# Patient Record
Sex: Female | Born: 1980 | Race: White | Hispanic: No | Marital: Married | Smoking: Never smoker
Health system: Southern US, Community
[De-identification: ages and names within clinical notes are randomized; demographics above are authoritative.]

## PROBLEM LIST (undated history)

## (undated) DIAGNOSIS — F329 Major depressive disorder, single episode, unspecified: Secondary | ICD-10-CM

## (undated) DIAGNOSIS — F419 Anxiety disorder, unspecified: Secondary | ICD-10-CM

## (undated) DIAGNOSIS — T7840XA Allergy, unspecified, initial encounter: Secondary | ICD-10-CM

## (undated) DIAGNOSIS — F32A Depression, unspecified: Secondary | ICD-10-CM

## (undated) DIAGNOSIS — B001 Herpesviral vesicular dermatitis: Secondary | ICD-10-CM

## (undated) DIAGNOSIS — M549 Dorsalgia, unspecified: Secondary | ICD-10-CM

## (undated) DIAGNOSIS — G43909 Migraine, unspecified, not intractable, without status migrainosus: Secondary | ICD-10-CM

## (undated) HISTORY — DX: Herpesviral vesicular dermatitis: B00.1

## (undated) HISTORY — PX: GANGLION CYST EXCISION: SHX1691

## (undated) HISTORY — DX: Allergy, unspecified, initial encounter: T78.40XA

## (undated) HISTORY — DX: Major depressive disorder, single episode, unspecified: F32.9

## (undated) HISTORY — DX: Migraine, unspecified, not intractable, without status migrainosus: G43.909

## (undated) HISTORY — DX: Anxiety disorder, unspecified: F41.9

## (undated) HISTORY — DX: Depression, unspecified: F32.A

---

## 2008-12-16 ENCOUNTER — Emergency Department (HOSPITAL_BASED_OUTPATIENT_CLINIC_OR_DEPARTMENT_OTHER): Admission: EM | Admit: 2008-12-16 | Discharge: 2008-12-16 | Payer: Self-pay | Admitting: Emergency Medicine

## 2009-01-10 ENCOUNTER — Emergency Department (HOSPITAL_BASED_OUTPATIENT_CLINIC_OR_DEPARTMENT_OTHER): Admission: EM | Admit: 2009-01-10 | Discharge: 2009-01-10 | Payer: Self-pay | Admitting: Emergency Medicine

## 2009-01-10 ENCOUNTER — Ambulatory Visit: Payer: Self-pay | Admitting: Diagnostic Radiology

## 2009-01-12 ENCOUNTER — Ambulatory Visit (HOSPITAL_COMMUNITY): Admission: RE | Admit: 2009-01-12 | Discharge: 2009-01-12 | Payer: Self-pay | Admitting: Pediatrics

## 2011-01-17 LAB — COMPREHENSIVE METABOLIC PANEL
AST: 19 U/L (ref 0–37)
Albumin: 4.3 g/dL (ref 3.5–5.2)
Calcium: 9.4 mg/dL (ref 8.4–10.5)
Chloride: 106 mEq/L (ref 96–112)
Creatinine, Ser: 0.7 mg/dL (ref 0.4–1.2)
GFR calc Af Amer: >60 mL/min (ref >60)
Sodium: 141 mEq/L (ref 135–145)
Total Bilirubin: 0.6 mg/dL (ref 0.3–1.2)

## 2011-01-17 LAB — DIFFERENTIAL
Eosinophils Relative: 1 % (ref 0–5)
Lymphocytes Relative: 28 % (ref 12–46)
Lymphs Abs: 1.7 10*3/uL (ref 0.7–4.0)
Monocytes Absolute: 0.4 10*3/uL (ref 0.1–1.0)

## 2011-01-17 LAB — URINALYSIS, ROUTINE W REFLEX MICROSCOPIC
Bilirubin Urine: NEGATIVE
Hgb urine dipstick: NEGATIVE
Specific Gravity, Urine: 1.018 (ref 1.005–1.030)
pH: 6 (ref 5.0–8.0)

## 2011-01-17 LAB — CBC
MCV: 91.5 fL (ref 78.0–100.0)
Platelets: 233 10*3/uL (ref 150–400)
WBC: 6.2 10*3/uL (ref 4.0–10.5)

## 2011-01-17 LAB — URINE MICROSCOPIC-ADD ON

## 2011-01-17 LAB — POCT TOXICOLOGY PANEL

## 2011-02-20 NOTE — Procedures (Signed)
REFERRING PHYSICIAN:  Deanna Artis. Sharene Skeans, M.D.   CLINICAL HISTORY:  A 30 year old, who is being evaluated for recent  episode of confusion, staring, forgetfulness, and possible seizures.   MEDICATION:  Listed antibiotic for bladder infection.   This is an 17-channel routine EEG recorded with the patient awake and  drowsy, using a 17-channel machine and standard 10/20 electrode  placement.   Background awake rhythm consists of 8-9 Hz alpha, which is of moderate  amplitude, synchronous, reactive to eye-opening and closure.  No  paroxysmal epileptiform activity is noted.  However, intermittent left  temporal and central sharp waves are noted as well as theta slowing.  Rare isolated right central sharp waves are noted as well particularly  during drowsiness.  Length of the recording is 21.8 minutes.  Technical  component is average.  EKG tracing reveals regular sinus rhythm.  Hyperventilation and photic stimulation unremarkable.  Stages of light  sleep are noted towards the end of the tracing.   IMPRESSION:  This EEG is abnormal due to presence of focal, left  temporal, and central cortical irritability, but no definite  epileptiform features are seen.           ______________________________  Sunny Schlein. Pearlean Brownie, MD     ZOX:WRUE  D:  01/12/2009 19:51:19  T:  01/13/2009 04:51:02  Job #:  [id number]

## 2013-02-22 ENCOUNTER — Emergency Department: Payer: Self-pay | Admitting: Emergency Medicine

## 2013-02-22 LAB — URINALYSIS, COMPLETE
Bilirubin,UR: NEGATIVE
Glucose,UR: NEGATIVE mg/dL (ref 0–75)
Ketone: NEGATIVE
Protein: NEGATIVE
RBC,UR: 33 /HPF (ref 0–5)
Specific Gravity: 1.01 (ref 1.003–1.030)
Squamous Epithelial: 2
WBC UR: 58 /HPF (ref 0–5)

## 2013-02-25 LAB — BETA STREP CULTURE(ARMC)

## 2013-02-27 ENCOUNTER — Emergency Department: Payer: Self-pay | Admitting: Emergency Medicine

## 2013-02-27 LAB — CBC WITH DIFFERENTIAL/PLATELET
Basophil #: 0 10*3/uL (ref 0.0–0.1)
Basophil %: 0.5 %
Eosinophil #: 0 10*3/uL (ref 0.0–0.7)
HCT: 37.3 % (ref 35.0–47.0)
HGB: 12.7 g/dL (ref 12.0–16.0)
Lymphocyte #: 2.1 10*3/uL (ref 1.0–3.6)
Lymphocyte %: 37.3 %
MCH: 29.2 pg (ref 26.0–34.0)
MCHC: 34.2 g/dL (ref 32.0–36.0)
Monocyte %: 8.3 %
Neutrophil %: 53 %
RBC: 4.37 10*6/uL (ref 3.80–5.20)

## 2013-02-27 LAB — URINALYSIS, COMPLETE
Bacteria: NONE SEEN
Bilirubin,UR: NEGATIVE
Glucose,UR: NEGATIVE mg/dL (ref 0–75)
Nitrite: NEGATIVE
Ph: 6 (ref 4.5–8.0)
Protein: NEGATIVE

## 2013-02-27 LAB — COMPREHENSIVE METABOLIC PANEL
Anion Gap: 5 — ABNORMAL LOW (ref 7–16)
BUN: 12 mg/dL (ref 7–18)
Bilirubin,Total: 0.3 mg/dL (ref 0.2–1.0)
Chloride: 109 mmol/L — ABNORMAL HIGH (ref 98–107)
EGFR (African American): >60
EGFR (Non-African Amer.): >60
Glucose: 88 mg/dL (ref 65–99)
Osmolality: 279 (ref 275–301)
Potassium: 3.7 mmol/L (ref 3.5–5.1)
Sodium: 140 mmol/L (ref 136–145)

## 2013-02-28 LAB — URINE CULTURE

## 2013-06-14 ENCOUNTER — Emergency Department: Payer: Self-pay | Admitting: Internal Medicine

## 2013-08-06 ENCOUNTER — Emergency Department: Payer: Self-pay | Admitting: Emergency Medicine

## 2013-08-06 LAB — URINALYSIS, COMPLETE
Blood: NEGATIVE
Glucose,UR: NEGATIVE mg/dL (ref 0–75)
Leukocyte Esterase: NEGATIVE
Protein: NEGATIVE
RBC,UR: <1 /HPF (ref 0–5)
Specific Gravity: 1.017 (ref 1.003–1.030)
Squamous Epithelial: 6
WBC UR: <1 /HPF (ref 0–5)

## 2013-08-06 LAB — COMPREHENSIVE METABOLIC PANEL
BUN: 12 mg/dL (ref 7–18)
Chloride: 108 mmol/L — ABNORMAL HIGH (ref 98–107)
Co2: 28 mmol/L (ref 21–32)
Creatinine: 0.78 mg/dL (ref 0.60–1.30)
Glucose: 91 mg/dL (ref 65–99)
SGPT (ALT): 16 U/L (ref 12–78)
Sodium: 139 mmol/L (ref 136–145)

## 2013-08-06 LAB — CBC
HGB: 13.4 g/dL (ref 12.0–16.0)
MCHC: 33.8 g/dL (ref 32.0–36.0)
MCV: 87 fL (ref 80–100)
RBC: 4.56 10*6/uL (ref 3.80–5.20)
WBC: 7.3 10*3/uL (ref 3.6–11.0)

## 2013-08-06 LAB — LIPASE, BLOOD: Lipase: 72 U/L — ABNORMAL LOW (ref 73–393)

## 2013-08-17 ENCOUNTER — Ambulatory Visit: Payer: Self-pay | Admitting: Surgery

## 2013-08-18 ENCOUNTER — Ambulatory Visit: Payer: Self-pay | Admitting: Surgery

## 2013-08-26 ENCOUNTER — Ambulatory Visit: Payer: Self-pay | Admitting: Surgery

## 2014-01-08 ENCOUNTER — Emergency Department: Payer: Self-pay | Admitting: Emergency Medicine

## 2014-08-27 ENCOUNTER — Ambulatory Visit: Payer: Self-pay | Admitting: Specialist

## 2014-10-08 NOTE — L&D Delivery Note (Signed)
Delivery Summary for LiberiaBrittany M Lozano  Labor Events:   Preterm labor:   Rupture date:   Rupture time:   Rupture type: Intact  Fluid Color:   Induction:   Augmentation:   Complications:   Cervical ripening:          Delivery:   Episiotomy:   Lacerations:   Repair suture:   Repair # of packets:   Blood loss (ml): 900   Information for the patient's newborn:  Blane Oharaorterfield, Boy Shalamar [[id number]][[id number]]    Delivery 08/26/2015 12:43 PM by  C-Section, Low Transverse Sex:  female Gestational Age: 325w5d Delivery Clinician:  Hildred LaserAnika Berneta Sconyers Living?: Yes        APGARS  One minute Five minutes Ten minutes  Skin color: 1   1      Heart rate: 2   2      Grimace: 2   2      Muscle tone: 2   2      Breathing: 2   2      Totals: 9  9      Presentation/position:      Resuscitation: None  Cord information: 3 vessels   Disposition of cord blood:     Blood gases sent?  Complications:   Placenta: Delivered: 08/26/2015 12:44 PM    Intact appearance Newborn Measurements: Weight: 10 lb 1.2 oz (4570 g)  Height: 21.26"  Head circumference: 38 cm  Chest circumference: 38 cm  Other providers: Delivery Assist Delivery Nurse Registered Nurse Neonatologist Melody N Shambley Geralyn FlashKendra L Pierce Samantha D Murriel HopperVaughn Christie Davanzo  Additional  information: Forceps:   Vacuum:   Breech:   Observed anomalies       See Operative Note for details of C-section procedure.   Hildred LaserAnika Alesandro Stueve, MD [facility]

## 2015-01-04 LAB — OB RESULTS CONSOLE RUBELLA ANTIBODY, IGM
RUBELLA: IMMUNE
Rubella: IMMUNE
Rubella: IMMUNE

## 2015-01-04 LAB — OB RESULTS CONSOLE ABO/RH
RH TYPE: POSITIVE
RH Type: POSITIVE
RH Type: POSITIVE

## 2015-01-04 LAB — OB RESULTS CONSOLE HIV ANTIBODY (ROUTINE TESTING)
HIV: NONREACTIVE
HIV: NONREACTIVE
HIV: NONREACTIVE

## 2015-01-04 LAB — OB RESULTS CONSOLE HEPATITIS B SURFACE ANTIGEN
HEP B S AG: NEGATIVE
Hepatitis B Surface Ag: NEGATIVE
Hepatitis B Surface Ag: NEGATIVE

## 2015-01-04 LAB — OB RESULTS CONSOLE VARICELLA ZOSTER ANTIBODY, IGG
VARICELLA IGG: IMMUNE
Varicella: IMMUNE
Varicella: IMMUNE

## 2015-01-04 LAB — OB RESULTS CONSOLE GC/CHLAMYDIA
CHLAMYDIA, DNA PROBE: NEGATIVE
Chlamydia: NEGATIVE
Chlamydia: NEGATIVE
GC PROBE AMP, GENITAL: NEGATIVE
GC PROBE AMP, GENITAL: NEGATIVE
GC PROBE AMP, GENITAL: NEGATIVE

## 2015-01-04 LAB — OB RESULTS CONSOLE RPR
RPR: NONREACTIVE
RPR: NONREACTIVE
RPR: NONREACTIVE

## 2015-01-04 LAB — OB RESULTS CONSOLE ANTIBODY SCREEN
ANTIBODY SCREEN: NEGATIVE
ANTIBODY SCREEN: NEGATIVE
Antibody Screen: NEGATIVE

## 2015-01-04 LAB — OB RESULTS CONSOLE PLATELET COUNT
PLATELETS: 278 10*3/uL
Platelets: 278 10*3/uL

## 2015-01-04 LAB — OB RESULTS CONSOLE HGB/HCT, BLOOD
HCT: 40 %
HEMATOCRIT: 40 %
Hemoglobin: 13.5 g/dL
Hemoglobin: 13.5 g/dL

## 2015-01-29 NOTE — Op Note (Signed)
PATIENT NAME:  Rachel [name], Rachel [name] MR#:  [id number] DATE OF BIRTH:  Sep 14, 1981  DATE OF PROCEDURE:  08/27/2014  PREOPERATIVE DIAGNOSIS: Dorsal right wrist ganglion.   POSTOPERATIVE DIAGNOSIS: Dorsal right wrist ganglion.   PROCEDURE: Excision of dorsal right wrist ganglion.   SURGEON: Clare Gandyhristopher E. Jeremaih Klima, MD.   ANESTHESIA: General.   COMPLICATIONS: None.   TOURNIQUET TIME: 55 minutes.   DESCRIPTION OF PROCEDURE: After adequate induction of general anesthesia, the right upper extremity is thoroughly prepped with alcohol and ChloraPrep and draped in a standard sterile fashion. The extremity is wrapped out with the Esmarch bandage and pneumatic tourniquet elevated to 250 mmHg. Under loupe magnification, a standard transverse dorsal incision is made at the wrist over the prominence of the ganglion. The dissection is carried down and the retinaculum is incised transversely the tendons underlying are retracted to each side and preserved throughout the case. The ganglion is completely dissected out under loupe magnification, including a stalk which extends down to the radiocarpal joint. A 1 cm portion of dorsal capsule is also removed with the specimen which is sent to pathology. Careful search for any residual ganglion is made and none is seen. The wound is thoroughly irrigated multiple times. Skin edges are infiltrated with 0.5% plain Marcaine. Retinaculum is repaired with 5-0 Vicryl. One 5-0 Vicryl subcutaneous suture is placed. The skin is closed with a running subcuticular 3-0 Prolene. A soft bulky dressing with a volar fiberglass splint is applied. The tourniquet is released. The patient is returned to the recovery room in satisfactory condition having tolerated the procedure quite well.     ____________________________ Clare Gandyhristopher E. Kasem Mozer, MD ces:at D: 08/27/2014 09:52:35 ET T: 08/27/2014 14:47:17 ET JOB#: [id number]  cc: Clare Gandyhristopher E. Tamya Denardo, MD, <Dictator> Clare GandyHRISTOPHER E Tarea Skillman  MD ELECTRONICALLY SIGNED 08/31/2014 12:30

## 2015-01-31 LAB — SURGICAL PATHOLOGY

## 2015-03-06 DIAGNOSIS — F339 Major depressive disorder, recurrent, unspecified: Secondary | ICD-10-CM | POA: Insufficient documentation

## 2015-03-06 DIAGNOSIS — F419 Anxiety disorder, unspecified: Secondary | ICD-10-CM

## 2015-03-06 DIAGNOSIS — F341 Dysthymic disorder: Secondary | ICD-10-CM | POA: Insufficient documentation

## 2015-03-06 DIAGNOSIS — F329 Major depressive disorder, single episode, unspecified: Secondary | ICD-10-CM

## 2015-03-09 ENCOUNTER — Encounter: Payer: Self-pay | Admitting: Obstetrics and Gynecology

## 2015-03-09 ENCOUNTER — Ambulatory Visit (INDEPENDENT_AMBULATORY_CARE_PROVIDER_SITE_OTHER): Payer: BLUE CROSS/BLUE SHIELD | Admitting: Obstetrics and Gynecology

## 2015-03-09 ENCOUNTER — Encounter (HOSPITAL_COMMUNITY): Payer: Self-pay

## 2015-03-09 VITALS — BP 128/84 | HR 88 | Ht 65.0 in | Wt 160.5 lb

## 2015-03-09 DIAGNOSIS — Z3490 Encounter for supervision of normal pregnancy, unspecified, unspecified trimester: Secondary | ICD-10-CM

## 2015-03-09 DIAGNOSIS — Z331 Pregnant state, incidental: Secondary | ICD-10-CM

## 2015-03-09 NOTE — Progress Notes (Signed)
ROB- doing well except for onset of daily HAs x 2 weeks, mainly located at back of head/neck and sometimes radiate to temples- vomited once with HA; not relieved with tylenol- reviewed normal causes of HAs in pregnancy including increased hormones, caffiene withdrawal, muscle strain- to try motrin 2 tablets with 6oz caffiene every 6 hours as needed; reviewed low risk female noted on panarama; Anatomy scan next visit.

## 2015-03-09 NOTE — Progress Notes (Signed)
Pt is c/o headaches x 2 weeks, not relieved by tylenol

## 2015-04-06 ENCOUNTER — Encounter: Payer: Self-pay | Admitting: Obstetrics and Gynecology

## 2015-04-06 ENCOUNTER — Ambulatory Visit (INDEPENDENT_AMBULATORY_CARE_PROVIDER_SITE_OTHER): Payer: BLUE CROSS/BLUE SHIELD | Admitting: Obstetrics and Gynecology

## 2015-04-06 ENCOUNTER — Ambulatory Visit: Payer: BLUE CROSS/BLUE SHIELD

## 2015-04-06 VITALS — BP 89/58 | HR 106 | Wt 165.0 lb

## 2015-04-06 DIAGNOSIS — Z3492 Encounter for supervision of normal pregnancy, unspecified, second trimester: Secondary | ICD-10-CM | POA: Diagnosis not present

## 2015-04-06 DIAGNOSIS — Z3490 Encounter for supervision of normal pregnancy, unspecified, unspecified trimester: Secondary | ICD-10-CM

## 2015-04-06 LAB — POCT URINALYSIS DIPSTICK
Bilirubin, UA: NEGATIVE
Blood, UA: NEGATIVE
Glucose, UA: 100
Ketones, UA: NEGATIVE
LEUKOCYTES UA: NEGATIVE
Nitrite, UA: NEGATIVE
Spec Grav, UA: 1.01
UROBILINOGEN UA: 0.2
pH, UA: 6.5

## 2015-04-06 NOTE — Progress Notes (Signed)
ROB & Anatomy scan; ScanFetal presentation is breech, spine posterior.  EFW: 290 grams ( 10 oz. ) Placenta: anterior, grade 0, and remote to cervix by > 4 cm. AFI: adequate with mvp at 4.1 cm.  Anatomic survey is complete and normal; Gender - female  .   Right Ovary is not visualized. Left Ovary is not visualized. There is no evidence of a corpus luteal cyst. Survey of the adnexa demonstrates no adnexal masses. There is no free peritoneal fluid in the cul de sac.  Impression: 1. 19 week 3 day Viable Singleton Intrauterine pregnancy by U/S. 2. (U/S) EDD is consistent with Clinically established (LMP) EDD of 08/28/15. 3. Normal Anatomy Scan reveals  Reiterated need to increase water intake daily.

## 2015-04-06 NOTE — Progress Notes (Signed)
Pt denies any complaints.

## 2015-04-21 ENCOUNTER — Other Ambulatory Visit: Payer: Self-pay

## 2015-04-21 MED ORDER — ACYCLOVIR 400 MG PO TABS: 400.0000 mg | ORAL_TABLET | Freq: Three times a day (TID) | ORAL | Status: DC

## 2015-04-25 ENCOUNTER — Other Ambulatory Visit: Payer: Self-pay

## 2015-04-25 MED ORDER — ACYCLOVIR 400 MG PO TABS: 400.0000 mg | ORAL_TABLET | Freq: Three times a day (TID) | ORAL | Status: DC

## 2015-05-04 ENCOUNTER — Encounter: Payer: Self-pay | Admitting: Obstetrics and Gynecology

## 2015-05-04 ENCOUNTER — Ambulatory Visit (INDEPENDENT_AMBULATORY_CARE_PROVIDER_SITE_OTHER): Payer: BLUE CROSS/BLUE SHIELD | Admitting: Obstetrics and Gynecology

## 2015-05-04 VITALS — BP 92/69 | HR 92 | Wt 170.6 lb

## 2015-05-04 DIAGNOSIS — Z3492 Encounter for supervision of normal pregnancy, unspecified, second trimester: Secondary | ICD-10-CM

## 2015-05-04 LAB — POCT URINALYSIS DIPSTICK
Bilirubin, UA: NEGATIVE
Blood, UA: NEGATIVE
Glucose, UA: 250
KETONES UA: NEGATIVE
Leukocytes, UA: NEGATIVE
Nitrite, UA: NEGATIVE
Spec Grav, UA: 1.01
Urobilinogen, UA: 0.2
pH, UA: 7

## 2015-05-04 NOTE — Progress Notes (Signed)
Pt is here denies any complaints 

## 2015-05-04 NOTE — Progress Notes (Signed)
ROB- doing well, recommended BFC and sibling class- schedule given; glucola next visit.

## 2015-05-31 ENCOUNTER — Ambulatory Visit (INDEPENDENT_AMBULATORY_CARE_PROVIDER_SITE_OTHER): Payer: BLUE CROSS/BLUE SHIELD | Admitting: Obstetrics and Gynecology

## 2015-05-31 ENCOUNTER — Encounter: Payer: Self-pay | Admitting: Obstetrics and Gynecology

## 2015-05-31 VITALS — BP 92/68 | HR 90 | Wt 173.9 lb

## 2015-05-31 DIAGNOSIS — Z23 Encounter for immunization: Secondary | ICD-10-CM | POA: Diagnosis not present

## 2015-05-31 DIAGNOSIS — Z331 Pregnant state, incidental: Secondary | ICD-10-CM

## 2015-05-31 DIAGNOSIS — Z131 Encounter for screening for diabetes mellitus: Secondary | ICD-10-CM

## 2015-05-31 LAB — POCT URINALYSIS DIPSTICK
KETONES UA: NEGATIVE
Leukocytes, UA: NEGATIVE
NITRITE UA: NEGATIVE
PH UA: 7
RBC UA: NEGATIVE
SPEC GRAV UA: 1.01
Urobilinogen, UA: 0.2

## 2015-05-31 MED ORDER — TETANUS-DIPHTH-ACELL PERTUSSIS 5-2.5-18.5 LF-MCG/0.5 IM SUSP
0.5000 mL | Freq: Once | INTRAMUSCULAR | Status: AC
  Administered 2015-05-31: 0.5 mL via INTRAMUSCULAR

## 2015-05-31 NOTE — Progress Notes (Signed)
ROB- glucola done, used repHresh and worked well. Discussed cord blood banking. Ok to take tylenol PM as needed.

## 2015-05-31 NOTE — Progress Notes (Signed)
ROB-having some trouble sleeping, tdap given, blood consent signed, glucola done today

## 2015-06-01 ENCOUNTER — Other Ambulatory Visit: Payer: Self-pay | Admitting: Obstetrics and Gynecology

## 2015-06-01 ENCOUNTER — Telehealth: Payer: Self-pay | Admitting: *Deleted

## 2015-06-01 DIAGNOSIS — O99019 Anemia complicating pregnancy, unspecified trimester: Secondary | ICD-10-CM | POA: Insufficient documentation

## 2015-06-01 LAB — HEMOGLOBIN AND HEMATOCRIT, BLOOD
Hematocrit: 32.5 % — ABNORMAL LOW (ref 34.0–46.6)
Hemoglobin: 10.8 g/dL — ABNORMAL LOW (ref 11.1–15.9)

## 2015-06-01 LAB — GLUCOSE, 1 HOUR GESTATIONAL: Gestational Diabetes Screen: 116 mg/dL (ref 65–139)

## 2015-06-01 MED ORDER — FUSION PLUS PO CAPS: 1.0000 | ORAL_CAPSULE | Freq: Every day | ORAL | Status: DC

## 2015-06-01 NOTE — Telephone Encounter (Signed)
-----   Message from Rachel [name], [place] sent at 06/01/2015  8:32 AM EDT ----- Please let her know she passed her glucola and is slightly anemic- want her to start iron supplement- I sent in rx for Fusion plus- to take one daily Monday-Friday every week till delivery

## 2015-06-01 NOTE — Telephone Encounter (Signed)
Notified pt of lab results, she will pick up rx , pt voiced understanding

## 2015-06-06 ENCOUNTER — Telehealth: Payer: Self-pay | Admitting: Obstetrics and Gynecology

## 2015-06-06 NOTE — Telephone Encounter (Signed)
Pt has a question about a virus.... Possibly strep throat and [redacted] wks pregnant.

## 2015-06-07 ENCOUNTER — Other Ambulatory Visit: Payer: Self-pay | Admitting: *Deleted

## 2015-06-07 MED ORDER — CEFDINIR 300 MG PO CAPS: 300.0000 mg | ORAL_CAPSULE | Freq: Two times a day (BID) | ORAL | Status: DC

## 2015-06-07 NOTE — Telephone Encounter (Signed)
Pt is coming by for strep test

## 2015-06-14 ENCOUNTER — Encounter: Payer: Self-pay | Admitting: Obstetrics and Gynecology

## 2015-06-14 ENCOUNTER — Ambulatory Visit (INDEPENDENT_AMBULATORY_CARE_PROVIDER_SITE_OTHER): Payer: BLUE CROSS/BLUE SHIELD | Admitting: Obstetrics and Gynecology

## 2015-06-14 VITALS — BP 95/54 | HR 109 | Wt 177.1 lb

## 2015-06-14 DIAGNOSIS — B370 Candidal stomatitis: Secondary | ICD-10-CM

## 2015-06-14 DIAGNOSIS — Z331 Pregnant state, incidental: Secondary | ICD-10-CM

## 2015-06-14 LAB — POCT URINALYSIS DIPSTICK
Bilirubin, UA: NEGATIVE
Blood, UA: NEGATIVE
Ketones, UA: NEGATIVE
LEUKOCYTES UA: NEGATIVE
Nitrite, UA: NEGATIVE
PROTEIN UA: NEGATIVE
UROBILINOGEN UA: 0.2
pH, UA: 5

## 2015-06-14 MED ORDER — NYSTATIN 100000 UNIT/ML MT SUSP: 5.0000 mL | Freq: Four times a day (QID) | OROMUCOSAL | Status: DC

## 2015-06-14 NOTE — Progress Notes (Signed)
ROB-pt was treated for strep throat last week, she is having some coughing-dry cough

## 2015-06-14 NOTE — Progress Notes (Signed)
ROB- completed treatment for strep, thrush noted on tongue Rx for Nystatin sent to [facility]. Otherwise doing well.

## 2015-06-28 ENCOUNTER — Encounter: Payer: Self-pay | Admitting: Obstetrics and Gynecology

## 2015-06-28 ENCOUNTER — Ambulatory Visit (INDEPENDENT_AMBULATORY_CARE_PROVIDER_SITE_OTHER): Payer: BLUE CROSS/BLUE SHIELD | Admitting: Obstetrics and Gynecology

## 2015-06-28 VITALS — BP 94/65 | HR 96 | Wt 178.7 lb

## 2015-06-28 DIAGNOSIS — Z3493 Encounter for supervision of normal pregnancy, unspecified, third trimester: Secondary | ICD-10-CM

## 2015-06-28 LAB — POCT URINALYSIS DIPSTICK
BILIRUBIN UA: NEGATIVE
Ketones, UA: NEGATIVE
Leukocytes, UA: NEGATIVE
Nitrite, UA: NEGATIVE
Protein, UA: NEGATIVE
RBC UA: NEGATIVE
UROBILINOGEN UA: 0.2
pH, UA: 6

## 2015-06-28 MED ORDER — INFLUENZA VAC SPLIT QUAD 0.5 ML IM SUSY: 0.5000 mL | PREFILLED_SYRINGE | Freq: Once | INTRAMUSCULAR | Status: DC

## 2015-06-28 NOTE — Progress Notes (Signed)
ROB-pt denies any new complaints, flu vaccine given

## 2015-06-28 NOTE — Progress Notes (Signed)
ROB- discussed diet and increasing protein; also will start tapering off Zoloft per her preference.

## 2015-07-13 ENCOUNTER — Ambulatory Visit (INDEPENDENT_AMBULATORY_CARE_PROVIDER_SITE_OTHER): Payer: BLUE CROSS/BLUE SHIELD | Admitting: Obstetrics and Gynecology

## 2015-07-13 ENCOUNTER — Encounter: Payer: Self-pay | Admitting: Obstetrics and Gynecology

## 2015-07-13 VITALS — BP 98/68 | HR 100 | Wt 179.6 lb

## 2015-07-13 DIAGNOSIS — Z3493 Encounter for supervision of normal pregnancy, unspecified, third trimester: Secondary | ICD-10-CM

## 2015-07-13 LAB — POCT URINALYSIS DIPSTICK
BILIRUBIN UA: NEGATIVE
Ketones, UA: NEGATIVE
Leukocytes, UA: NEGATIVE
Nitrite, UA: NEGATIVE
Protein, UA: NEGATIVE
RBC UA: NEGATIVE
SPEC GRAV UA: 1.015
Urobilinogen, UA: 0.2
pH, UA: 6

## 2015-07-13 NOTE — Progress Notes (Signed)
ROB-c/o R hip pain x 3 days seems to have gotten worse

## 2015-07-13 NOTE — Patient Instructions (Signed)
Sciatica Sciatica is pain, weakness, numbness, or tingling along the path of the sciatic nerve. The nerve starts in the lower back and runs down the back of each leg. The nerve controls the muscles in the lower leg and in the back of the knee, while also providing sensation to the back of the thigh, lower leg, and the sole of your foot. Sciatica is a symptom of another medical condition. For instance, nerve damage or certain conditions, such as a herniated disk or bone spur on the spine, pinch or put pressure on the sciatic nerve. This causes the pain, weakness, or other sensations normally associated with sciatica. Generally, sciatica only affects one side of the body. CAUSES   Herniated or slipped disc.  Degenerative disk disease.  A pain disorder involving the narrow muscle in the buttocks (piriformis syndrome).  Pelvic injury or fracture.  Pregnancy.  Tumor (rare). SYMPTOMS  Symptoms can vary from mild to very severe. The symptoms usually travel from the low back to the buttocks and down the back of the leg. Symptoms can include:  Mild tingling or dull aches in the lower back, leg, or hip.  Numbness in the back of the calf or sole of the foot.  Burning sensations in the lower back, leg, or hip.  Sharp pains in the lower back, leg, or hip.  Leg weakness.  Severe back pain inhibiting movement. These symptoms may get worse with coughing, sneezing, laughing, or prolonged sitting or standing. Also, being overweight may worsen symptoms. DIAGNOSIS  Your caregiver will perform a physical exam to look for common symptoms of sciatica. He or she may ask you to do certain movements or activities that would trigger sciatic nerve pain. Other tests may be performed to find the cause of the sciatica. These may include:  Blood tests.  X-rays.  Imaging tests, such as an MRI or CT scan. TREATMENT  Treatment is directed at the cause of the sciatic pain. Sometimes, treatment is not necessary  and the pain and discomfort goes away on its own. If treatment is needed, your caregiver may suggest:  Over-the-counter medicines to relieve pain.  Prescription medicines, such as anti-inflammatory medicine, muscle relaxants, or narcotics.  Applying heat or ice to the painful area.  Steroid injections to lessen pain, irritation, and inflammation around the nerve.  Reducing activity during periods of pain.  Exercising and stretching to strengthen your abdomen and improve flexibility of your spine. Your caregiver may suggest losing weight if the extra weight makes the back pain worse.  Physical therapy.  Surgery to eliminate what is pressing or pinching the nerve, such as a bone spur or part of a herniated disk. HOME CARE INSTRUCTIONS   Only take over-the-counter or prescription medicines for pain or discomfort as directed by your caregiver.  Apply ice to the affected area for 20 minutes, 3-4 times a day for the first 48-72 hours. Then try heat in the same way.  Exercise, stretch, or perform your usual activities if these do not aggravate your pain.  Attend physical therapy sessions as directed by your caregiver.  Keep all follow-up appointments as directed by your caregiver.  Do not wear high heels or shoes that do not provide proper support.  Check your mattress to see if it is too soft. A firm mattress may lessen your pain and discomfort. SEEK IMMEDIATE MEDICAL CARE IF:   You lose control of your bowel or bladder (incontinence).  You have increasing weakness in the lower back, pelvis, buttocks,   or legs.  You have redness or swelling of your back.  You have a burning sensation when you urinate.  You have pain that gets worse when you lie down or awakens you at night.  Your pain is worse than you have experienced in the past.  Your pain is lasting longer than 4 weeks.  You are suddenly losing weight without reason. MAKE SURE YOU:  Understand these  instructions.  Will watch your condition.  Will get help right away if you are not doing well or get worse.   This information is not intended to replace advice given to you by your health care provider. Make sure you discuss any questions you have with your health care provider.   Document Released: 09/18/2001 Document Revised: 06/15/2015 Document Reviewed: 02/03/2012 Elsevier Interactive Patient Education 2016 [employer]. Levonorgestrel intrauterine device (IUD) What is this medicine? LEVONORGESTREL IUD (LEE voe nor jes trel) is a contraceptive (birth control) device. The device is placed inside the uterus by a healthcare professional. It is used to prevent pregnancy and can also be used to treat heavy bleeding that occurs during your period. Depending on the device, it can be used for 3 to 5 years. This medicine may be used for other purposes; ask your health care provider or pharmacist if you have questions. What should I tell my health care provider before I take this medicine? They need to know if you have any of these conditions: -abnormal Pap smear -cancer of the breast, uterus, or cervix -diabetes -endometritis -genital or pelvic infection now or in the past -have more than one sexual partner or your partner has more than one partner -heart disease -history of an ectopic or tubal pregnancy -immune system problems -IUD in place -liver disease or tumor -problems with blood clots or take blood-thinners -use intravenous drugs -uterus of unusual shape -vaginal bleeding that has not been explained -an unusual or allergic reaction to levonorgestrel, other hormones, silicone, or polyethylene, medicines, foods, dyes, or preservatives -pregnant or trying to get pregnant -breast-feeding How should I use this medicine? This device is placed inside the uterus by a health care professional. Talk to your pediatrician regarding the use of this medicine in children. Special care may be  needed. Overdosage: If you think you have taken too much of this medicine contact a poison control center or emergency room at once. NOTE: This medicine is only for you. Do not share this medicine with others. What if I miss a dose? This does not apply. What may interact with this medicine? Do not take this medicine with any of the following medications: -amprenavir -bosentan -fosamprenavir This medicine may also interact with the following medications: -aprepitant -barbiturate medicines for inducing sleep or treating seizures -bexarotene -griseofulvin -medicines to treat seizures like carbamazepine, ethotoin, felbamate, oxcarbazepine, phenytoin, topiramate -modafinil -pioglitazone -rifabutin -rifampin -rifapentine -some medicines to treat HIV infection like atazanavir, indinavir, lopinavir, nelfinavir, tipranavir, ritonavir -St. John's wort -warfarin This list may not describe all possible interactions. Give your health care provider a list of all the medicines, herbs, non-prescription drugs, or dietary supplements you use. Also tell them if you smoke, drink alcohol, or use illegal drugs. Some items may interact with your medicine. What should I watch for while using this medicine? Visit your doctor or health care professional for regular check ups. See your doctor if you or your partner has sexual contact with others, becomes HIV positive, or gets a sexual transmitted disease. This product does not protect you against HIV  infection (AIDS) or other sexually transmitted diseases. You can check the placement of the IUD yourself by reaching up to the top of your vagina with clean fingers to feel the threads. Do not pull on the threads. It is a good habit to check placement after each menstrual period. Call your doctor right away if you feel more of the IUD than just the threads or if you cannot feel the threads at all. The IUD may come out by itself. You may become pregnant if the device  comes out. If you notice that the IUD has come out use a backup birth control method like condoms and call your health care provider. Using tampons will not change the position of the IUD and are okay to use during your period. What side effects may I notice from receiving this medicine? Side effects that you should report to your doctor or health care professional as soon as possible: -allergic reactions like skin rash, itching or hives, swelling of the face, lips, or tongue -fever, flu-like symptoms -genital sores -high blood pressure -no menstrual period for 6 weeks during use -pain, swelling, warmth in the leg -pelvic pain or tenderness -severe or sudden headache -signs of pregnancy -stomach cramping -sudden shortness of breath -trouble with balance, talking, or walking -unusual vaginal bleeding, discharge -yellowing of the eyes or skin Side effects that usually do not require medical attention (report to your doctor or health care professional if they continue or are bothersome): -acne -breast pain -change in sex drive or performance -changes in weight -cramping, dizziness, or faintness while the device is being inserted -headache -irregular menstrual bleeding within first 3 to 6 months of use -nausea This list may not describe all possible side effects. Call your doctor for medical advice about side effects. You may report side effects to FDA at 1-800-FDA-1088. Where should I keep my medicine? This does not apply. NOTE: This sheet is a summary. It may not cover all possible information. If you have questions about this medicine, talk to your doctor, pharmacist, or health care provider.    2016, Elsevier/Gold Standard. (2011-10-25 13:54:04)

## 2015-07-13 NOTE — Progress Notes (Signed)
ROB- recommend chiropractor for sciatic pain

## 2015-07-26 ENCOUNTER — Encounter: Payer: Self-pay | Admitting: Obstetrics and Gynecology

## 2015-07-26 ENCOUNTER — Ambulatory Visit (INDEPENDENT_AMBULATORY_CARE_PROVIDER_SITE_OTHER): Payer: BLUE CROSS/BLUE SHIELD | Admitting: Obstetrics and Gynecology

## 2015-07-26 VITALS — BP 98/67 | HR 74 | Wt 181.3 lb

## 2015-07-26 DIAGNOSIS — Z3493 Encounter for supervision of normal pregnancy, unspecified, third trimester: Secondary | ICD-10-CM

## 2015-07-26 LAB — POCT URINALYSIS DIPSTICK
Bilirubin, UA: NEGATIVE
Blood, UA: NEGATIVE
KETONES UA: NEGATIVE
LEUKOCYTES UA: NEGATIVE
Nitrite, UA: NEGATIVE
PH UA: 7
SPEC GRAV UA: 1.01
UROBILINOGEN UA: 0.2

## 2015-07-26 NOTE — Progress Notes (Signed)
ROB doing well, cultures next visit. 

## 2015-07-26 NOTE — Progress Notes (Signed)
ROB-pt denies any changes, states her sciatic nerve pain is a lot better Lots of pelvic pressure

## 2015-08-04 ENCOUNTER — Ambulatory Visit (INDEPENDENT_AMBULATORY_CARE_PROVIDER_SITE_OTHER): Payer: BLUE CROSS/BLUE SHIELD | Admitting: Obstetrics and Gynecology

## 2015-08-04 ENCOUNTER — Encounter: Payer: Self-pay | Admitting: Obstetrics and Gynecology

## 2015-08-04 VITALS — BP 97/56 | HR 58 | Wt 185.3 lb

## 2015-08-04 DIAGNOSIS — Z113 Encounter for screening for infections with a predominantly sexual mode of transmission: Secondary | ICD-10-CM

## 2015-08-04 DIAGNOSIS — Z36 Encounter for antenatal screening of mother: Secondary | ICD-10-CM

## 2015-08-04 DIAGNOSIS — Z3685 Encounter for antenatal screening for Streptococcus B: Secondary | ICD-10-CM

## 2015-08-04 DIAGNOSIS — Z3493 Encounter for supervision of normal pregnancy, unspecified, third trimester: Secondary | ICD-10-CM

## 2015-08-04 LAB — POCT URINALYSIS DIPSTICK
Bilirubin, UA: NEGATIVE
KETONES UA: NEGATIVE
Leukocytes, UA: NEGATIVE
Nitrite, UA: NEGATIVE
PROTEIN UA: NEGATIVE
RBC UA: NEGATIVE
Urobilinogen, UA: 0.2
pH, UA: 6

## 2015-08-04 NOTE — Progress Notes (Signed)
ROB-lots of pelvic pressure, some contractions, cultures obtained

## 2015-08-04 NOTE — Progress Notes (Signed)
ROB- cultures obtained, labor precautions discussed. 

## 2015-08-06 LAB — GC/CHLAMYDIA PROBE AMP
CHLAMYDIA, DNA PROBE: NEGATIVE
NEISSERIA GONORRHOEAE BY PCR: NEGATIVE

## 2015-08-06 LAB — STREP GP B NAA: STREP GROUP B AG: NEGATIVE

## 2015-08-10 ENCOUNTER — Ambulatory Visit (INDEPENDENT_AMBULATORY_CARE_PROVIDER_SITE_OTHER): Payer: BLUE CROSS/BLUE SHIELD | Admitting: Obstetrics and Gynecology

## 2015-08-10 VITALS — BP 102/68 | HR 109 | Wt 186.5 lb

## 2015-08-10 DIAGNOSIS — Z3493 Encounter for supervision of normal pregnancy, unspecified, third trimester: Secondary | ICD-10-CM

## 2015-08-10 LAB — POCT URINALYSIS DIPSTICK
BILIRUBIN UA: NEGATIVE
GLUCOSE UA: 250
KETONES UA: NEGATIVE
Nitrite, UA: NEGATIVE
PH UA: 6.5
Protein, UA: NEGATIVE
RBC UA: NEGATIVE
Urobilinogen, UA: NEGATIVE

## 2015-08-10 NOTE — Progress Notes (Signed)
ROB: Doing well.  Notes irregular contractions.  36 labs neg.  Labor precautions given. RTC in 1 week.

## 2015-08-11 ENCOUNTER — Telehealth: Payer: Self-pay | Admitting: Obstetrics and Gynecology

## 2015-08-11 NOTE — Telephone Encounter (Signed)
PT CALLED AND SHE WANTED YOU TO CALL HER BACK ASAP SHE HAD SOME QUESTIONS SHE NEEDED TO ASK YOU.

## 2015-08-11 NOTE — Telephone Encounter (Signed)
Called pt she has had a lot of BM today, some contractions, advised pt to walk, hydrate, labor precautions discussed she voiced understanding

## 2015-08-17 ENCOUNTER — Ambulatory Visit (INDEPENDENT_AMBULATORY_CARE_PROVIDER_SITE_OTHER): Payer: BLUE CROSS/BLUE SHIELD | Admitting: Obstetrics and Gynecology

## 2015-08-17 ENCOUNTER — Encounter: Payer: Self-pay | Admitting: Obstetrics and Gynecology

## 2015-08-17 VITALS — BP 116/80 | HR 88 | Wt 187.6 lb

## 2015-08-17 DIAGNOSIS — Z3493 Encounter for supervision of normal pregnancy, unspecified, third trimester: Secondary | ICD-10-CM

## 2015-08-17 LAB — POCT URINALYSIS DIPSTICK
Bilirubin, UA: NEGATIVE
Ketones, UA: NEGATIVE
Leukocytes, UA: NEGATIVE
Nitrite, UA: NEGATIVE
PH UA: 6.5
PROTEIN UA: NEGATIVE
RBC UA: NEGATIVE
SPEC GRAV UA: 1.01
UROBILINOGEN UA: 0.2

## 2015-08-17 NOTE — Progress Notes (Signed)
ROB-c/o "vinegar smell" not sure if coming from vagina, having some contractions, pelvic pressure

## 2015-08-17 NOTE — Progress Notes (Signed)
ROB- doing fine, labor precautions reierated; reviewed negative cultures; vaginal pH 6.4- reassured of normal

## 2015-08-25 ENCOUNTER — Encounter: Payer: Self-pay | Admitting: *Deleted

## 2015-08-25 ENCOUNTER — Ambulatory Visit: Payer: BLUE CROSS/BLUE SHIELD

## 2015-08-25 ENCOUNTER — Inpatient Hospital Stay
Admission: RE | Admit: 2015-08-25 | Discharge: 2015-08-28 | DRG: 766 | Disposition: A | Discharge status: Home / Self Care | Payer: BLUE CROSS/BLUE SHIELD | Source: Intra-hospital | Attending: Obstetrics and Gynecology | Admitting: Obstetrics and Gynecology

## 2015-08-25 ENCOUNTER — Encounter: Payer: Self-pay | Admitting: Obstetrics and Gynecology

## 2015-08-25 ENCOUNTER — Ambulatory Visit (INDEPENDENT_AMBULATORY_CARE_PROVIDER_SITE_OTHER): Payer: BLUE CROSS/BLUE SHIELD | Admitting: Obstetrics and Gynecology

## 2015-08-25 VITALS — BP 105/82 | HR 101 | Wt 184.9 lb

## 2015-08-25 DIAGNOSIS — Z3493 Encounter for supervision of normal pregnancy, unspecified, third trimester: Secondary | ICD-10-CM | POA: Diagnosis not present

## 2015-08-25 DIAGNOSIS — Z3490 Encounter for supervision of normal pregnancy, unspecified, unspecified trimester: Secondary | ICD-10-CM

## 2015-08-25 DIAGNOSIS — F329 Major depressive disorder, single episode, unspecified: Secondary | ICD-10-CM | POA: Diagnosis present

## 2015-08-25 DIAGNOSIS — Z3A39 39 weeks gestation of pregnancy: Secondary | ICD-10-CM

## 2015-08-25 DIAGNOSIS — Z3483 Encounter for supervision of other normal pregnancy, third trimester: Secondary | ICD-10-CM | POA: Diagnosis not present

## 2015-08-25 DIAGNOSIS — O3663X0 Maternal care for excessive fetal growth, third trimester, not applicable or unspecified: Principal | ICD-10-CM | POA: Diagnosis present

## 2015-08-25 DIAGNOSIS — O339 Maternal care for disproportion, unspecified: Secondary | ICD-10-CM | POA: Diagnosis present

## 2015-08-25 DIAGNOSIS — O99344 Other mental disorders complicating childbirth: Secondary | ICD-10-CM | POA: Diagnosis present

## 2015-08-25 DIAGNOSIS — O99019 Anemia complicating pregnancy, unspecified trimester: Secondary | ICD-10-CM

## 2015-08-25 DIAGNOSIS — F419 Anxiety disorder, unspecified: Secondary | ICD-10-CM | POA: Diagnosis present

## 2015-08-25 LAB — CBC
HCT: 32.6 % — ABNORMAL LOW (ref 35.0–47.0)
Hemoglobin: 10.2 g/dL — ABNORMAL LOW (ref 12.0–16.0)
MCH: 23.9 pg — AB (ref 26.0–34.0)
MCHC: 31.2 g/dL — AB (ref 32.0–36.0)
MCV: 76.7 fL — ABNORMAL LOW (ref 80.0–100.0)
PLATELETS: 174 10*3/uL (ref 150–440)
RBC: 4.24 MIL/uL (ref 3.80–5.20)
RDW: 15.7 % — AB (ref 11.5–14.5)
WBC: 7.9 10*3/uL (ref 3.6–11.0)

## 2015-08-25 LAB — POCT URINALYSIS DIPSTICK
BILIRUBIN UA: NEGATIVE
Blood, UA: NEGATIVE
Ketones, UA: NEGATIVE
LEUKOCYTES UA: NEGATIVE
Nitrite, UA: NEGATIVE
PH UA: 6
Spec Grav, UA: 1.015
Urobilinogen, UA: 0.2

## 2015-08-25 MED ORDER — ONDANSETRON HCL 4 MG/2ML IJ SOLN: 4.0000 mg | Freq: Four times a day (QID) | INTRAMUSCULAR | Status: DC | PRN

## 2015-08-25 MED ORDER — MISOPROSTOL 200 MCG PO TABS
ORAL_TABLET | ORAL | Status: AC
Start: 2015-08-25 — End: 2015-08-26
  Filled 2015-08-25: qty 4

## 2015-08-25 MED ORDER — ACETAMINOPHEN 325 MG PO TABS: 650.0000 mg | ORAL_TABLET | ORAL | Status: DC | PRN

## 2015-08-25 MED ORDER — OXYTOCIN 10 UNIT/ML IJ SOLN
INTRAMUSCULAR | Status: AC
  Filled 2015-08-25: qty 2

## 2015-08-25 MED ORDER — AMMONIA AROMATIC IN INHA
RESPIRATORY_TRACT | Status: AC
  Filled 2015-08-25: qty 10

## 2015-08-25 MED ORDER — TERBUTALINE SULFATE 1 MG/ML IJ SOLN
0.2500 mg | Freq: Once | INTRAMUSCULAR | Status: DC | PRN
Start: 2015-08-25 — End: 2015-08-26

## 2015-08-25 MED ORDER — OXYTOCIN 40 UNITS IN LACTATED RINGERS INFUSION - SIMPLE MED: 62.5000 mL/h | INTRAVENOUS | Status: DC

## 2015-08-25 MED ORDER — LIDOCAINE HCL (PF) 1 % IJ SOLN
INTRAMUSCULAR | Status: AC
  Filled 2015-08-25: qty 30

## 2015-08-25 MED ORDER — LACTATED RINGERS IV SOLN: 500.0000 mL | INTRAVENOUS | Status: DC | PRN

## 2015-08-25 MED ORDER — LACTATED RINGERS IV SOLN
INTRAVENOUS | Status: DC
  Administered 2015-08-25 – 2015-08-26 (×2): via INTRAVENOUS

## 2015-08-25 MED ORDER — OXYTOCIN 40 UNITS IN LACTATED RINGERS INFUSION - SIMPLE MED
1.0000 m[IU]/min | INTRAVENOUS | Status: DC
  Administered 2015-08-25: 1 m[IU]/min via INTRAVENOUS
  Filled 2015-08-25: qty 1000

## 2015-08-25 MED ORDER — OXYTOCIN BOLUS FROM INFUSION: 500.0000 mL | INTRAVENOUS | Status: DC

## 2015-08-25 MED ORDER — CITRIC ACID-SODIUM CITRATE 334-500 MG/5ML PO SOLN: 30.0000 mL | ORAL | Status: DC | PRN

## 2015-08-25 MED ORDER — LIDOCAINE HCL (PF) 1 % IJ SOLN
30.0000 mL | INTRAMUSCULAR | Status: DC | PRN
  Filled 2015-08-25: qty 30

## 2015-08-25 MED ORDER — FENTANYL CITRATE (PF) 100 MCG/2ML IJ SOLN
50.0000 ug | INTRAMUSCULAR | Status: DC | PRN
  Administered 2015-08-26: 15 ug via INTRAVENOUS

## 2015-08-25 NOTE — Progress Notes (Signed)
Report given to M.Schambley, CNM.   Pitocin decreased to 356mu/ml.

## 2015-08-25 NOTE — H&P (Signed)
Obstetric History and Physical  Rachel [name] is a 34 y.o. [name] with IUP at 1916w4d presenting for IOL for LGA at term. Patient states she has been having  none contractions, none vaginal bleeding, intact membranes, with active fetal movement.    [facility]  Pregnancy complications or risks:none  Prenatal labs and studies: ABO, Rh: A, A, A/Positive, Positive, Positive/-- (03/29 0000) Antibody: Negative, Negative, Negative (03/29 0000) Rubella: Immune, Immune, Immune (03/29 0000) RPR: Nonreactive, Nonreactive, Nonreactive (03/29 0000)  HBsAg: Negative, Negative, Negative (03/29 0000)  HIV: Non-reactive, Non-reactive, Non-reactive (03/29 0000)  JXB:JYNWGNFAGBS:Negative (10/27 1010) 1 hr Glucola  normal Genetic screening normal Anatomy US normal  Past Medical History  Diagnosis Date  . Anxiety   . Depression     Past Surgical History  Procedure Laterality Date  . Ganglion cyst excision Right wrist    OB History  Gravida Para Term Preterm AB SAB TAB Ectopic Multiple Living  2 1 1  0 0 0 0 0  1    # Outcome Date GA Lbr Len/2nd Weight Sex Delivery Anes PTL Lv  2 Current           1 Term 2013    M Vag-Spont   Y      Social History   Social History  . Marital Status: Single    Spouse Name: N/A  . Number of Children: N/A  . Years of Education: N/A   Social History Main Topics  . Smoking status: Never Smoker   . Smokeless tobacco: Never Used  . Alcohol Use: No  . Drug Use: No  . Sexual Activity: Yes    Birth Control/ Protection: None   Other Topics Concern  . None   Social History Narrative   ** Merged History Encounter **        Family History  Problem Relation Age of Onset  . Heart disease Mother   . Heart disease Father     Facility-administered medications prior to admission  Medication Dose Route Frequency Provider Last Rate Last Dose  . Influenza vac split quadrivalent PF (FLUARIX) injection 0.5 mL  0.5 mL Intramuscular Once  Rachel [name], Rachel [name]       Prescriptions prior to admission  Medication Sig Dispense Refill Last Dose  . Iron-FA-B Cmp-C-Biot-Probiotic (FUSION PLUS) CAPS Take 1 capsule by mouth daily. 60 capsule 1 08/24/2015 at Unknown time  . nystatin (MYCOSTATIN) 100000 UNIT/ML suspension TAKE 5 MLS (500,000 UNITS TOTAL) BY MOUTH 4 TIMES DAILY.  0 08/25/2015 at Unknown time  . Prenatal Vit-Fe Fumarate-FA (PRENATAL MULTIVITAMIN) TABS tablet Take 1 tablet by mouth daily at 12 noon.   08/25/2015 at Unknown time  . acyclovir (ZOVIRAX) 400 MG tablet Take 1 tablet (400 mg total) by mouth 3 (three) times daily. (Patient not taking: Reported on 08/25/2015) 21 tablet 0 Not Taking at Unknown time    Allergies  Allergen Reactions  . Penicillins Anaphylaxis  . Sulfa Antibiotics Rash    Review of Systems: Negative except for what is mentioned in HPI.  Physical Exam: BP 100/64 mmHg  Pulse 69  Temp(Src) 98.2 F (36.8 C) (Oral)  Resp 18  Ht 5\' 5"  (1.651 m)  Wt 83.87 kg (184 lb 14.4 oz)  BMI 30.77 kg/m2  LMP 11/21/2014 (LMP Unknown) GENERAL: Well-developed, well-nourished female in no acute distress.  LUNGS: Clear to auscultation bilaterally.  HEART: Regular rate and rhythm. ABDOMEN: Soft, nontender, nondistended, gravid. EXTREMITIES: Nontender, no edema, 2+ distal pulses. Cervical Exam:  FHT:  Baseline rate 144 bpm   Variability moderate  Accelerations present   Decelerations none Contractions: Every 2-3 mins on 35mu/min of pitocin   Pertinent Labs/Studies:   Results for orders placed or performed during the hospital encounter of 08/25/15 (from the past 24 hour(s))  CBC     Status: Abnormal   Collection Time: 08/25/15  1:42 PM  Result Value Ref Range   WBC 7.9 3.6 - 11.0 K/uL   RBC 4.24 3.80 - 5.20 MIL/uL   Hemoglobin 10.2 (L) 12.0 - 16.0 g/dL   HCT 16.1 (L) 09.6 - 04.5 %   MCV 76.7 (L) 80.0 - 100.0 fL   MCH 23.9 (L) 26.0 - 34.0 pg   MCHC 31.2 (L) 32.0 - 36.0 g/dL   RDW 40.9 (H) 81.1 - 91.4 %    Platelets 174 150 - 440 K/uL    Assessment : Rachel [name] at [redacted]w[redacted]d being admitted for labor.  Plan: Labor: Expectant management.  Induction with pitocin per protocol FWB: Reassuring fetal heart tracing.  GBS negative Delivery plan: Hopeful for vaginal delivery  Rachel [name], Rachel [name] [facility], [facility]

## 2015-08-25 NOTE — Progress Notes (Signed)
Pt has been assisted to birthing ball at bedside.  Up to bathroom and back to ball.  States her contractions are getting stronger.

## 2015-08-25 NOTE — Progress Notes (Signed)
ROB- suspect LGA- u/s done today, reveals: Indications:Growth, AFI Findings:  Singleton intrauterine pregnancy is visualized with FHR at 136 BPM. Biometrics give an (U/S) Gestational age of 32 6/7 weeks and an (U/S) EDD of 08/26/15; this correlates with the clinically established EDD of 08/28/15.  Fetal presentation is Vertex.  EFW: 4174 g (9lb 3 oz). Placenta: Anterior grade 2. AFI: 18 cm.    Impression: 1. 39 6/7 week Viable Singleton Intrauterine pregnancy by U/S. 2. (U/S) EDD is consistent with Clinically established (LMP) EDD of 08/28/15.   Recommendations: 1.Clinical correlation with the patient's History and Physical Exam.  Rachel MediciMaria E [name]   Scan reviewed and agree with findings- reviewed with MAD. Sent to L&D for IOL

## 2015-08-25 NOTE — Progress Notes (Signed)
ROB-some pelvic pressure, also check her throat c/o white spots on tongue

## 2015-08-26 ENCOUNTER — Inpatient Hospital Stay: Payer: BLUE CROSS/BLUE SHIELD | Admitting: Anesthesiology

## 2015-08-26 ENCOUNTER — Encounter
Admission: RE | Disposition: A | Discharge status: Home / Self Care | Payer: Self-pay | Attending: Obstetrics and Gynecology

## 2015-08-26 ENCOUNTER — Encounter: Payer: Self-pay | Admitting: Anesthesiology

## 2015-08-26 DIAGNOSIS — Z3483 Encounter for supervision of other normal pregnancy, third trimester: Secondary | ICD-10-CM

## 2015-08-26 LAB — RPR: RPR: NONREACTIVE

## 2015-08-26 LAB — TYPE AND SCREEN
ABO/RH(D): A POS
ANTIBODY SCREEN: NEGATIVE

## 2015-08-26 LAB — ABO/RH: ABO/RH(D): A POS

## 2015-08-26 SURGERY — Surgical Case
Anesthesia: Spinal

## 2015-08-26 MED ORDER — SENNOSIDES-DOCUSATE SODIUM 8.6-50 MG PO TABS: 2.0000 | ORAL_TABLET | ORAL | Status: DC

## 2015-08-26 MED ORDER — MAGNESIUM HYDROXIDE 400 MG/5ML PO SUSP: 30.0000 mL | ORAL | Status: DC | PRN

## 2015-08-26 MED ORDER — GENTAMICIN SULFATE 40 MG/ML IJ SOLN: 5.0000 mg/kg | INTRAVENOUS | Status: DC

## 2015-08-26 MED ORDER — CLINDAMYCIN PHOSPHATE 900 MG/50ML IV SOLN
900.0000 mg | INTRAVENOUS | Status: AC
  Administered 2015-08-26: 900 mg via INTRAVENOUS
  Filled 2015-08-26 (×2): qty 50

## 2015-08-26 MED ORDER — OXYCODONE-ACETAMINOPHEN 5-325 MG PO TABS
1.0000 | ORAL_TABLET | ORAL | Status: DC | PRN
  Administered 2015-08-27 – 2015-08-28 (×4): 1 via ORAL
  Filled 2015-08-26 (×3): qty 1

## 2015-08-26 MED ORDER — IBUPROFEN 600 MG PO TABS
600.0000 mg | ORAL_TABLET | Freq: Four times a day (QID) | ORAL | Status: DC
  Administered 2015-08-26: 600 mg via ORAL
  Filled 2015-08-26: qty 1

## 2015-08-26 MED ORDER — ZOLPIDEM TARTRATE 5 MG PO TABS
ORAL_TABLET | ORAL | Status: AC
  Administered 2015-08-26: 5 mg via ORAL
  Filled 2015-08-26: qty 1

## 2015-08-26 MED ORDER — SIMETHICONE 80 MG PO CHEW
80.0000 mg | CHEWABLE_TABLET | ORAL | Status: DC | PRN
  Administered 2015-08-27 (×3): 80 mg via ORAL
  Filled 2015-08-26 (×6): qty 1

## 2015-08-26 MED ORDER — DEXTROSE 5 % IV SOLN
5.0000 mg/kg | INTRAVENOUS | Status: AC
  Administered 2015-08-26: 420 mg via INTRAVENOUS
  Filled 2015-08-26: qty 10.5

## 2015-08-26 MED ORDER — KETOROLAC TROMETHAMINE 30 MG/ML IJ SOLN: 30.0000 mg | Freq: Four times a day (QID) | INTRAMUSCULAR | Status: DC | PRN

## 2015-08-26 MED ORDER — MORPHINE SULFATE (PF) 0.5 MG/ML IJ SOLN
INTRAMUSCULAR | Status: DC | PRN
  Administered 2015-08-26: .1 mg via INTRATHECAL

## 2015-08-26 MED ORDER — DIPHENHYDRAMINE HCL 25 MG PO CAPS: 25.0000 mg | ORAL_CAPSULE | Freq: Four times a day (QID) | ORAL | Status: DC | PRN

## 2015-08-26 MED ORDER — GENTAMICIN SULFATE 40 MG/ML IJ SOLN
5.0000 mg/kg | INTRAVENOUS | Status: DC
  Filled 2015-08-26: qty 10.5

## 2015-08-26 MED ORDER — DIPHENHYDRAMINE HCL 25 MG PO CAPS: 25.0000 mg | ORAL_CAPSULE | ORAL | Status: DC | PRN

## 2015-08-26 MED ORDER — MENTHOL 3 MG MT LOZG: 1.0000 | LOZENGE | OROMUCOSAL | Status: DC | PRN

## 2015-08-26 MED ORDER — SIMETHICONE 80 MG PO CHEW
80.0000 mg | CHEWABLE_TABLET | ORAL | Status: DC
  Administered 2015-08-27 – 2015-08-28 (×2): 80 mg via ORAL

## 2015-08-26 MED ORDER — LACTATED RINGERS IV SOLN: INTRAVENOUS | Status: DC

## 2015-08-26 MED ORDER — WITCH HAZEL-GLYCERIN EX PADS: 1.0000 "application " | MEDICATED_PAD | CUTANEOUS | Status: DC | PRN

## 2015-08-26 MED ORDER — ZOLPIDEM TARTRATE 5 MG PO TABS: 5.0000 mg | ORAL_TABLET | Freq: Every evening | ORAL | Status: DC | PRN

## 2015-08-26 MED ORDER — OXYCODONE-ACETAMINOPHEN 5-325 MG PO TABS
2.0000 | ORAL_TABLET | ORAL | Status: DC | PRN
  Administered 2015-08-26 – 2015-08-27 (×5): 2 via ORAL
  Filled 2015-08-26 (×6): qty 2

## 2015-08-26 MED ORDER — OXYTOCIN 40 UNITS IN LACTATED RINGERS INFUSION - SIMPLE MED
INTRAVENOUS | Status: DC | PRN
  Administered 2015-08-26: 700 mL via INTRAVENOUS

## 2015-08-26 MED ORDER — NALBUPHINE HCL 10 MG/ML IJ SOLN
5.0000 mg | INTRAMUSCULAR | Status: DC | PRN
  Filled 2015-08-26: qty 0.5

## 2015-08-26 MED ORDER — DEXTROSE 5 % IV SOLN: 1.0000 ug/kg/h | INTRAVENOUS | Status: DC | PRN

## 2015-08-26 MED ORDER — ONDANSETRON HCL 4 MG/2ML IJ SOLN
4.0000 mg | Freq: Four times a day (QID) | INTRAMUSCULAR | Status: DC | PRN
  Administered 2015-08-26: 4 mg via INTRAVENOUS
  Filled 2015-08-26: qty 2

## 2015-08-26 MED ORDER — NALBUPHINE HCL 10 MG/ML IJ SOLN
5.0000 mg | Freq: Once | INTRAMUSCULAR | Status: DC | PRN
  Filled 2015-08-26: qty 0.5

## 2015-08-26 MED ORDER — LIDOCAINE 5 % EX PTCH
1.0000 | MEDICATED_PATCH | CUTANEOUS | Status: DC
  Administered 2015-08-26: 10:00:00 via TRANSDERMAL
  Filled 2015-08-26 (×2): qty 1

## 2015-08-26 MED ORDER — MORPHINE SULFATE (PF) 2 MG/ML IV SOLN: 2.0000 mg | INTRAVENOUS | Status: DC | PRN

## 2015-08-26 MED ORDER — ONDANSETRON HCL 4 MG/2ML IJ SOLN
INTRAMUSCULAR | Status: DC | PRN
  Administered 2015-08-26: 4 mg via INTRAVENOUS

## 2015-08-26 MED ORDER — SODIUM CHLORIDE 0.9 % IJ SOLN: 3.0000 mL | INTRAMUSCULAR | Status: DC | PRN

## 2015-08-26 MED ORDER — CLINDAMYCIN PHOSPHATE 900 MG/50ML IV SOLN
900.0000 mg | INTRAVENOUS | Status: AC
  Administered 2015-08-26: 900 mg via INTRAVENOUS
  Filled 2015-08-26: qty 50

## 2015-08-26 MED ORDER — DIBUCAINE 1 % RE OINT: 1.0000 "application " | TOPICAL_OINTMENT | RECTAL | Status: DC | PRN

## 2015-08-26 MED ORDER — LANOLIN HYDROUS EX OINT
1.0000 "application " | TOPICAL_OINTMENT | CUTANEOUS | Status: DC | PRN
  Administered 2015-08-28: 1 via TOPICAL

## 2015-08-26 MED ORDER — NALBUPHINE HCL 10 MG/ML IJ SOLN
5.0000 mg | Freq: Once | INTRAMUSCULAR | Status: DC | PRN
Start: 2015-08-26 — End: 2015-08-26
  Filled 2015-08-26: qty 0.5

## 2015-08-26 MED ORDER — ONDANSETRON HCL 4 MG/2ML IJ SOLN: 4.0000 mg | Freq: Three times a day (TID) | INTRAMUSCULAR | Status: DC | PRN

## 2015-08-26 MED ORDER — DIPHENHYDRAMINE HCL 50 MG/ML IJ SOLN: 12.5000 mg | INTRAMUSCULAR | Status: DC | PRN

## 2015-08-26 MED ORDER — IBUPROFEN 600 MG PO TABS
600.0000 mg | ORAL_TABLET | Freq: Four times a day (QID) | ORAL | Status: DC
  Administered 2015-08-26 – 2015-08-28 (×7): 600 mg via ORAL
  Filled 2015-08-26 (×7): qty 1

## 2015-08-26 MED ORDER — OXYTOCIN 40 UNITS IN LACTATED RINGERS INFUSION - SIMPLE MED
62.5000 mL/h | INTRAVENOUS | Status: AC
  Administered 2015-08-26: 62.5 mL/h via INTRAVENOUS
  Filled 2015-08-26: qty 1000

## 2015-08-26 MED ORDER — BUPIVACAINE IN DEXTROSE 0.75-8.25 % IT SOLN
INTRATHECAL | Status: DC | PRN
  Administered 2015-08-26: 1.6 mL via INTRATHECAL

## 2015-08-26 MED ORDER — PHENYLEPHRINE HCL 10 MG/ML IJ SOLN
INTRAMUSCULAR | Status: DC | PRN
  Administered 2015-08-26 (×15): 100 ug via INTRAVENOUS

## 2015-08-26 MED ORDER — CITRIC ACID-SODIUM CITRATE 334-500 MG/5ML PO SOLN
30.0000 mL | ORAL | Status: AC
  Administered 2015-08-26: 30 mL via ORAL
  Filled 2015-08-26: qty 30

## 2015-08-26 MED ORDER — CLINDAMYCIN PHOSPHATE 900 MG/50ML IV SOLN: 900.0000 mg | INTRAVENOUS | Status: DC

## 2015-08-26 MED ORDER — ACETAMINOPHEN 500 MG PO TABS: 1000.0000 mg | ORAL_TABLET | Freq: Four times a day (QID) | ORAL | Status: DC

## 2015-08-26 MED ORDER — PRENATAL MULTIVITAMIN CH
1.0000 | ORAL_TABLET | Freq: Every day | ORAL | Status: DC
  Administered 2015-08-27 – 2015-08-28 (×2): 1 via ORAL
  Filled 2015-08-26 (×2): qty 1

## 2015-08-26 MED ORDER — ZOLPIDEM TARTRATE 5 MG PO TABS
5.0000 mg | ORAL_TABLET | Freq: Every evening | ORAL | Status: AC | PRN
  Administered 2015-08-26: 5 mg via ORAL

## 2015-08-26 MED ORDER — LIDOCAINE 5 % EX PTCH
MEDICATED_PATCH | CUTANEOUS | Status: AC
  Filled 2015-08-26: qty 1

## 2015-08-26 MED ORDER — FERROUS SULFATE 325 (65 FE) MG PO TABS
325.0000 mg | ORAL_TABLET | Freq: Two times a day (BID) | ORAL | Status: DC
  Administered 2015-08-26 – 2015-08-28 (×4): 325 mg via ORAL
  Filled 2015-08-26 (×4): qty 1

## 2015-08-26 MED ORDER — NALOXONE HCL 0.4 MG/ML IJ SOLN: 0.4000 mg | INTRAMUSCULAR | Status: DC | PRN

## 2015-08-26 MED ORDER — LIDOCAINE 5 % EX PTCH
MEDICATED_PATCH | CUTANEOUS | Status: DC | PRN
  Administered 2015-08-26: 1 via TRANSDERMAL

## 2015-08-26 MED ORDER — ACETAMINOPHEN 325 MG PO TABS: 650.0000 mg | ORAL_TABLET | ORAL | Status: DC | PRN

## 2015-08-26 SURGICAL SUPPLY — 27 items
BAG COUNTER SPONGE EZ (MISCELLANEOUS) ×2 IMPLANT
CANISTER SUCT 3000ML (MISCELLANEOUS) ×2 IMPLANT
CHLORAPREP W/TINT 26ML (MISCELLANEOUS) ×4 IMPLANT
DRSG TELFA 3X8 NADH (GAUZE/BANDAGES/DRESSINGS) ×2 IMPLANT
GAUZE SPONGE 4X4 12PLY STRL (GAUZE/BANDAGES/DRESSINGS) ×2 IMPLANT
GLOVE BIO SURGEON STRL SZ 6 (GLOVE) ×6 IMPLANT
GLOVE BIOGEL PI IND STRL 6.5 (GLOVE) ×4 IMPLANT
GLOVE BIOGEL PI INDICATOR 6.5 (GLOVE) ×4
GOWN STRL REUS W/ TWL LRG LVL3 (GOWN DISPOSABLE) ×2 IMPLANT
GOWN STRL REUS W/TWL LRG LVL3 (GOWN DISPOSABLE) ×2
KIT RM TURNOVER STRD PROC AR (KITS) ×2 IMPLANT
MARKER SKIN W/RULER 31145785 (MISCELLANEOUS) ×2 IMPLANT
NS IRRIG 1000ML POUR BTL (IV SOLUTION) ×2 IMPLANT
PACK C SECTION AR (MISCELLANEOUS) ×2 IMPLANT
PAD GROUND ADULT SPLIT (MISCELLANEOUS) ×2 IMPLANT
PAD OB MATERNITY 4.3X12.25 (PERSONAL CARE ITEMS) ×2 IMPLANT
PAD PREP 24X41 OB/GYN DISP (PERSONAL CARE ITEMS) ×2 IMPLANT
SUT CHROMIC 0 CT 1 (SUTURE) IMPLANT
SUT MNCRL 3 0 RB1 (SUTURE) ×2 IMPLANT
SUT MNCRL AB 4-0 PS2 18 (SUTURE) IMPLANT
SUT MON AB 4-0 RB1 27 (SUTURE) ×2 IMPLANT
SUT MONOCRYL 3 0 RB1 (SUTURE) ×2
SUT PLAIN 2 0 XLH (SUTURE) IMPLANT
SUT VIC AB 0 CT1 36 (SUTURE) ×4 IMPLANT
SUT VIC AB 1 CT1 36 (SUTURE) ×4 IMPLANT
SUT VIC AB 3-0 SH 27 (SUTURE) ×1
SUT VIC AB 3-0 SH 27X BRD (SUTURE) ×1 IMPLANT

## 2015-08-26 NOTE — Anesthesia Procedure Notes (Addendum)
Spinal Patient location during procedure: OR Start time: 08/26/2015 12:05 PM End time: 08/26/2015 12:16 PM Staffing Anesthesiologist: Katy Fitch K Performed by: anesthesiologist  Preanesthetic Checklist Completed: patient identified, site marked, surgical consent, pre-op evaluation, timeout performed, IV checked, risks and benefits discussed and monitors and equipment checked Spinal Block Patient position: sitting Prep: Betadine Patient monitoring: heart rate, continuous pulse ox, blood pressure and cardiac monitor Approach: right paramedian (minline first then right paramedian) Location: L3-4 Injection technique: single-shot Needle Needle type: Whitacre and Introducer  Needle gauge: 24 G Needle length: 9 cm Assessment Sensory level: T6 Additional Notes Negative paresthesia. Negative blood return. Positive free-flowing CSF. Expiration date of kit checked and confirmed. Patient tolerated procedure well, without complications.  Spine is laterally rotated     Performed by: Demetrius Charity Pre-anesthesia Checklist: Patient identified, Emergency Drugs available, Suction available, Patient being monitored and Timeout performed Oxygen Delivery Method: Nasal cannula

## 2015-08-26 NOTE — Transfer of Care (Signed)
Immediate Anesthesia Transfer of Care Note  Patient: Rachel [name]  Procedure(s) Performed: Procedure(s): CESAREAN SECTION (N/A)  Patient Location: PACU  Anesthesia Type:Spinal  Level of Consciousness: awake, alert  and oriented  Airway & Oxygen Therapy: Patient Spontanous Breathing  Post-op Assessment: Report given to RN and Post -op Vital signs reviewed and stable  Post vital signs: Reviewed and stable  Last Vitals:  Filed Vitals:   08/26/15 1348  BP: 104/67  Pulse: 87  Temp: 36.2 C  Resp: 18    Complications: No apparent anesthesia complications

## 2015-08-26 NOTE — Progress Notes (Signed)
Pt breast feeding newborn with lactation consultant.

## 2015-08-26 NOTE — Consult Note (Signed)
Neonatology Note:   Attendance at C-section:   I was asked by Dr. Cherry to attend this primary C/S at term due to FTP after IOL. The mother is a G2P1 A pos, GBS neg with known LGA infant. ROM at delivery, fluid clear. Infant vigorous with good spontaneous cry and tone. Needed only minimal bulb suctioning. Ap 9/9. Lungs clear to ausc in DR. To CN to care of Pediatrician.  Charlaine Utsey C. Chas Axel, MD 

## 2015-08-26 NOTE — Progress Notes (Signed)
Fundus check with moderate amount of bloody discharge.

## 2015-08-26 NOTE — Progress Notes (Signed)
Rachel [name] is a 34 y.o. G2P1001 at 4167w5d by LMP admitted for induction of labor due to suspected LGA.  Subjective: Reports strong contractions last night with 20mu pitocin and feeling like baby got higher verses lower. Desires ceserean delivery.   Objective: BP 115/86 mmHg  Pulse 115  Temp(Src) 98.4 F (36.9 C) (Oral)  Resp 18  Ht 5\' 5"  (1.651 m)  Wt 83.87 kg (184 lb 14.4 oz)  BMI 30.77 kg/m2  LMP 11/21/2014 (LMP Unknown)      FHT:  FHR: 145 bpm, variability: moderate,  accelerations:  Present,  decelerations:  Absent UC:   none SVE:   Unable to reach cervix Labs: Lab Results  Component Value Date   WBC 7.9 08/25/2015   HGB 10.2* 08/25/2015   HCT 32.6* 08/25/2015   MCV 76.7* 08/25/2015   PLT 174 08/25/2015    Assessment / Plan: Arrest of decent  Labor: no progression of labor Preeclampsia:  labs stable Fetal Wellbeing:  Category III Pain Control:  Labor support without medications I/D:  n/a Anticipated MOD:  primary c/s with Dr Sharin Monsherry  Rachel [name] 08/26/2015, 7:39 AM

## 2015-08-26 NOTE — Progress Notes (Signed)
Pt had taken off BP cuff while on birthing ball and when she returned to bed.  Pt states she is feeling better at this time.

## 2015-08-26 NOTE — Progress Notes (Signed)
Fundus check firm without discharge.

## 2015-08-26 NOTE — Progress Notes (Signed)
Pt off the monitor to OR.

## 2015-08-26 NOTE — Anesthesia Preprocedure Evaluation (Addendum)
Anesthesia Evaluation  Patient identified by MRN, date of birth, ID band Patient awake    Reviewed: Allergy & Precautions, H&P , NPO status , Patient's Chart, lab work & pertinent test results  History of Anesthesia Complications Negative for: history of anesthetic complications  Airway Mallampati: I  TM Distance: >3 FB Neck ROM: full    Dental no notable dental hx. (+) Teeth Intact   Pulmonary neg pulmonary ROS,    Pulmonary exam normal breath sounds clear to auscultation       Cardiovascular Exercise Tolerance: Good (-) hypertensionnegative cardio ROS Normal cardiovascular exam Rhythm:regular Rate:Normal     Neuro/Psych PSYCHIATRIC DISORDERS Anxiety Depression negative neurological ROS  negative psych ROS   GI/Hepatic negative GI ROS, Neg liver ROS,   Endo/Other  negative endocrine ROS  Renal/GU negative Renal ROS  negative genitourinary   Musculoskeletal   Abdominal   Peds  Hematology negative hematology ROS (+)   Anesthesia Other Findings Past Medical History:   Anxiety                                                      Depression                                                  Past Surgical History:   GANGLION CYST EXCISION                          Right wrist       BMI    Body Mass Index   30.76 kg/m 2      Reproductive/Obstetrics (+) Pregnancy                             Anesthesia Physical Anesthesia Plan  ASA: I  Anesthesia Plan: Spinal   Post-op Pain Management:    Induction:   Airway Management Planned:   Additional Equipment:   Intra-op Plan:   Post-operative Plan:   Informed Consent: I have reviewed the patients History and Physical, chart, labs and discussed the procedure including the risks, benefits and alternatives for the proposed anesthesia with the patient or authorized representative who has indicated his/her understanding and acceptance.    Dental Advisory Given  Plan Discussed with: Anesthesiologist, CRNA and Surgeon  Anesthesia Plan Comments:         Anesthesia Quick Evaluation

## 2015-08-26 NOTE — Progress Notes (Signed)
Cesarean Section Preoperative Procedure Note   Rachel [name] is a 34 y.o. G2P1001 at 966w5d by LMP admitted for induction of labor due to suspected LGA.  Currently planning C-section for failure to progress.   Indications: Failure to progress, suspected CPD   Pre-operative Diagnosis: Arrest of descent, CPD.   Procedure: Primary low transverse C-section  Surgeon: Surgeon(s) and Role:    * Rachel LaserAnika Syla Devoss, MD - Primary   Assistants: Rachel [name], CNM  Anesthesia: spinal   Labs:  ABO, Rh: A, A, A/Positive, Positive, Positive/-- (03/29 0000) Antibody: Negative, Negative, Negative (03/29 0000) Rubella: Immune, Immune, Immune (03/29 0000) RPR: Nonreactive, Nonreactive, Nonreactive (03/29 0000)  HBsAg: Negative, Negative, Negative (03/29 0000)  HIV: Non-reactive, Non-reactive, Non-reactive (03/29 0000)  WGN:FAOZHYQMGBS:Negative (10/27 1010) 1 hr Glucola normal Genetic screening normal Anatomy US normal  Antibiotics: Clindamycin (patient with PCN allergy)   The risks of cesarean section were discussed with the patient including but were not limited to: bleeding which may require transfusion or reoperation; infection which may require antibiotics; injury to bowel, bladder, ureters or other surrounding organs; injury to the fetus; need for additional procedures including hysterectomy in the event of a life-threatening hemorrhage; placental abnormalities wth subsequent pregnancies, incisional problems, thromboembolic phenomenon and other postoperative/anesthesia complications.   The patient concurred with the proposed plan, giving informed written consent for the procedures.  Patient has been NPO since yesterday she will remain NPO for procedure. Anesthesia and OR aware.  Preoperative prophylactic antibiotics and SCDs ordered on call to the OR.  To OR when ready.   Signed: Surgeon(s): Rachel LaserAnika Kaisha Wachob, MD

## 2015-08-26 NOTE — Op Note (Signed)
Cesarean Section Procedure Note  Indications: cephalo-pelvic disproportion, failure to progress: arrest of descent  Pre-operative Diagnosis: 39 week 5 day pregnancy, cephalo-pelvic disproportion, LGA infant.  Post-operative Diagnosis: Same  Surgeon: Hildred LaserAnika Pernie Grosso, MD  Assistants: Rachel [name], CNM  Procedure: Primary low transverse Cesarean Section  Anesthesia: Spinal anesthesia  ASA Class: 2  Procedure Details: The patient was seen in the Holding Room. The risks, benefits, complications, treatment options, and expected outcomes were discussed with the patient.  The patient concurred with the proposed plan, giving informed consent.  The site of surgery properly noted/marked. The patient was taken to the Operating Room, identified as Rachel [name] and the procedure verified as C-Section Delivery. A Time Out was held and the above information confirmed.  After induction of anesthesia, the patient was draped and prepped in the usual sterile manner. Anesthesia was tested and noted to be adequate. A Pfannenstiel incision was made and carried down through the subcutaneous tissue to the fascia. Fascial incision was made and extended transversely. The fascia was separated from the underlying rectus tissue superiorly and inferiorly. The peritoneum was identified and entered. Peritoneal incision was extended longitudinally. The utero-vesical peritoneal reflection was incised transversely and the bladder flap was bluntly freed from the lower uterine segment. A low transverse uterine incision was made. Delivered from cephalic presentation was a 4570 gram Female with Apgar scores of 9 at one minute and 9 at five minutes.  The umbilical cord was clamped and cut. No cord blood was obtained for evaluation. The placenta was removed intact and appeared normal. The uterus was exteriorized and cleared of all clots and debris. The uterine outline, tubes and ovaries appeared normal.  The uterine incision was  closed with running locked sutures of 0-Vicryl.  A second suture of 0-Vicryl was used in an imbricating layer.  Hemostasis was observed. Lavage was carried out until clear. The fascia was then reapproximated with a running suture of 1-0 Vicryl. The subcutaneous fat layer was reapproximated with 3-0 Vicryl. The skin was reapproximated with 4-0 Monocryl.  Instrument, sponge, and needle counts were correct prior the abdominal closure and at the conclusion of the case.   Findings: Female infant, cephalic presentation, 4570 grams, with Apgar scores of 9 at one minute and 9 at five minutes. Intact placenta with 3 vessel cord.  The uterine outline, tubes and ovaries appeared normal.   Estimated Blood Loss:  900 ml      Drains: foley catheter to gravity drainage, 100 clear urine at end of the procedure         Total IV Fluids:  600 ml    Specimens: Placenta and Disposition:  Sent to Pathology         Implants: None         Complications:  None; patient tolerated the procedure well.         Disposition: PACU - hemodynamically stable.         Condition: stable   Hildred LaserAnika Haddon Fyfe, MD [facility]

## 2015-08-27 LAB — CBC
HCT: 28.2 % — ABNORMAL LOW (ref 35.0–47.0)
HEMOGLOBIN: 8.9 g/dL — AB (ref 12.0–16.0)
MCH: 24.2 pg — AB (ref 26.0–34.0)
MCHC: 31.7 g/dL — AB (ref 32.0–36.0)
MCV: 76.3 fL — AB (ref 80.0–100.0)
Platelets: 155 10*3/uL (ref 150–440)
RBC: 3.7 MIL/uL — AB (ref 3.80–5.20)
RDW: 15.6 % — ABNORMAL HIGH (ref 11.5–14.5)
WBC: 9.1 10*3/uL (ref 3.6–11.0)

## 2015-08-27 MED ORDER — SERTRALINE HCL 25 MG PO TABS
50.0000 mg | ORAL_TABLET | Freq: Every day | ORAL | Status: DC
  Administered 2015-08-27: 50 mg via ORAL
  Filled 2015-08-27: qty 2

## 2015-08-27 NOTE — Anesthesia Postprocedure Evaluation (Addendum)
  Anesthesia Post-op Note  Patient: Rachel [name]  Procedure(s) Performed: Procedure(s): CESAREAN SECTION (N/A)  Anesthesia type:Spinal  Patient location: Floor  Post pain: Pain level controlled  Post assessment: Post-op Vital signs reviewed, Patient's Cardiovascular Status Stable, Respiratory Function Stable, Patent Airway and No signs of Nausea or vomiting  Post vital signs: Reviewed and stable  Last Vitals:  Filed Vitals:   08/27/15 1223  BP: 105/60  Pulse: 113  Temp: 36.8 C  Resp: 18    Level of consciousness: awake, alert  and patient cooperative  Complications: No apparent anesthesia complications

## 2015-08-27 NOTE — Anesthesia Post-op Follow-up Note (Signed)
  Anesthesia Pain Follow-up Note  Patient: Georganna SkeansBrittany M Tavares  Day #: 1  Date of Follow-up: 08/27/2015 Time: 12:37 PM  Last Vitals:  Filed Vitals:   08/27/15 1223  BP: 105/60  Pulse: 113  Temp: 36.8 C  Resp: 18    Level of Consciousness: alert  Pain: mild   Side Effects:None  Catheter Site Exam:clean, dry, no drainage  Plan: D/C from anesthesia care  Cleda MccreedyJoseph K Saima Monterroso

## 2015-08-27 NOTE — Progress Notes (Signed)
Subjective: Post Op Day 1 Day Post-Op: Cesarean Delivery Patient reports incisional pain, no problems voiding, ambulating and Breastfeeding.    Objective: Vital signs in last 24 hours: Temp:  [97.7 F (36.5 C)-98.6 F (37 C)] 98.2 F (36.8 C) (11/19 1223) Pulse Rate:  [59-113] 113 (11/19 1223) Resp:  [18-20] 18 (11/19 1223) BP: (87-105)/(56-72) 105/60 mmHg (11/19 1223) SpO2:  [96 %-99 %] 97 % (11/19 1223)  Physical Exam:  General: alert, cooperative, appears stated age and up in shower Lochia: appropriate Uterine Fundus: firm Incision: no significant drainage DVT Evaluation: No evidence of DVT seen on physical exam.   Recent Labs  08/25/15 1342 08/27/15 0507  HGB 10.2* 8.9*  HCT 32.6* 28.2*     Assessment/Plan: Status post Cesarean section. Doing well postoperatively.  Continue current care.  Rachel [name] N Rachel [name] 08/27/2015, 3:40 PM

## 2015-08-28 MED ORDER — DOCUSATE SODIUM 100 MG PO CAPS: 100.0000 mg | ORAL_CAPSULE | Freq: Two times a day (BID) | ORAL | Status: DC

## 2015-08-28 MED ORDER — PRENATAL MULTIVITAMIN CH: 1.0000 | ORAL_TABLET | Freq: Every day | ORAL | Status: DC

## 2015-08-28 MED ORDER — OXYCODONE-ACETAMINOPHEN 5-325 MG PO TABS
2.0000 | ORAL_TABLET | ORAL | Status: DC | PRN
Start: 2015-08-28 — End: 2015-08-28

## 2015-08-28 MED ORDER — IBUPROFEN 600 MG PO TABS: 600.0000 mg | ORAL_TABLET | Freq: Four times a day (QID) | ORAL | Status: DC

## 2015-08-28 MED ORDER — WHITE PETROLATUM GEL
Status: AC
  Filled 2015-08-28: qty 10

## 2015-08-28 MED ORDER — SERTRALINE HCL 50 MG PO TABS: 50.0000 mg | ORAL_TABLET | Freq: Every day | ORAL | Status: DC

## 2015-08-28 MED ORDER — OXYCODONE-ACETAMINOPHEN 5-325 MG PO TABS: 2.0000 | ORAL_TABLET | ORAL | Status: DC | PRN

## 2015-08-28 MED ORDER — VITAMIN D 50 MCG (2000 UT) PO CAPS: 1.0000 | ORAL_CAPSULE | Freq: Every day | ORAL | Status: DC

## 2015-08-28 NOTE — Discharge Summary (Signed)
Obstetric Discharge Summary Reason for Admission: induction of labor Prenatal Procedures: ultrasound Intrapartum Procedures: cesarean: low cervical, transverse Postpartum Procedures: none Complications-Operative and Postpartum: anemia HEMOGLOBIN  Date Value Ref Range Status  08/27/2015 8.9* 12.0 - 16.0 g/dL Final  16/10/960403/29/2016 54.013.5 g/dL Final  98/11/914703/29/2016 82.913.5 g/dL Final   HGB  Date Value Ref Range Status  08/06/2013 13.4 12.0-16.0 g/dL Final   HCT  Date Value Ref Range Status  08/27/2015 28.2* 35.0 - 47.0 % Final  01/04/2015 40 % Final  01/04/2015 40 % Final  08/06/2013 39.6 35.0-47.0 % Final   HEMATOCRIT  Date Value Ref Range Status  05/31/2015 32.5* 34.0 - 46.6 % Final    Physical Exam:  General: alert, cooperative, appears stated age, fatigued and pale Lochia: appropriate Uterine Fundus: firm Incision: healing well, no significant drainage, no dehiscence DVT Evaluation: No evidence of DVT seen on physical exam. Negative Homan's sign.  Discharge Diagnoses: Term Pregnancy-delivered, Failed induction and primary LTCS for LGA and failure to desend into pelvis, PP anemia  Discharge Information: Date: 08/28/2015 Activity: pelvic rest Diet: routine Medications: PNV, Tylenol #3, Ibuprofen, Colace, Iron and Zoloft & acyclovir & Vit D3 daily, Plans Mirena PP Condition: stable Instructions: refer to practice specific booklet Discharge to: home   Newborn Data: Live born female Juanita CraverGrey, already circumcised Birth Weight: 10 lb 1.2 oz (4570 g) APGAR: 9, 9  Home with mother.  Melody N Shambley 08/28/2015, 10:03 AM

## 2015-08-28 NOTE — Progress Notes (Signed)
Discharge instructions complete and prescriptions given. Patient discharged home at 1145.

## 2015-08-29 ENCOUNTER — Encounter: Payer: Self-pay | Admitting: Obstetrics and Gynecology

## 2015-10-05 ENCOUNTER — Ambulatory Visit (INDEPENDENT_AMBULATORY_CARE_PROVIDER_SITE_OTHER): Payer: BLUE CROSS/BLUE SHIELD | Admitting: Obstetrics and Gynecology

## 2015-10-05 ENCOUNTER — Encounter: Payer: Self-pay | Admitting: Obstetrics and Gynecology

## 2015-10-05 NOTE — Patient Instructions (Signed)
  Place postpartum visit patient instructions here.  

## 2015-10-05 NOTE — Progress Notes (Signed)
  Subjective:     Rachel SkeansBrittany M Servais is a 34 y.o. female who presents for a postpartum visit. She is 6 weeks postpartum following a low cervical transverse Cesarean section. I have fully reviewed the prenatal and intrapartum course. The delivery was at 39 gestational weeks. Outcome: primary cesarean section, low transverse incision. Anesthesia: epidural. Postpartum course has been uneventful. Baby's course has been uneventful. Baby is feeding by breast. Bleeding no bleeding. Bowel function is normal. Bladder function is normal. Patient is not sexually active. Contraception method is abstinence. Postpartum depression screening: negative.  The following portions of the patient's history were reviewed and updated as appropriate: allergies, current medications, past family history, past medical history, past social history, past surgical history and problem list.  Review of Systems A comprehensive review of systems was negative.   Objective:    BP 111/48 mmHg  Pulse 50  Wt 157 lb 1.6 oz (71.26 kg)  Breastfeeding? Yes  General:  alert, cooperative and appears stated age   Breasts:  inspection negative, no nipple discharge or bleeding, no masses or nodularity palpable  Lungs: clear to auscultation bilaterally  Heart:  regular rate and rhythm, S1, S2 normal, no murmur, click, rub or gallop  Abdomen: soft, non-tender; bowel sounds normal; no masses,  no organomegaly and c/s incision healed well   Vulva:  normal  Vagina: normal vagina, no discharge, exudate, lesion, or erythema  Cervix:  multiparous appearance  Corpus: normal size, contour, position, consistency, mobility, non-tender  Adnexa:  no mass, fullness, tenderness  Rectal Exam: Not performed.        Assessment:     6 weeks postpartum exam Anemia Depression under control with SSRI . Pap smear not done at today's visit.   Plan:    1. Contraception: oral progesterone-only contraceptive 2. PP anemia- labs obtained 3. Follow up  in: 4 months for AE or as needed.   4. Amenorrhea secondary to breastfeeding

## 2015-10-06 ENCOUNTER — Other Ambulatory Visit: Payer: Self-pay | Admitting: Obstetrics and Gynecology

## 2015-10-06 DIAGNOSIS — E559 Vitamin D deficiency, unspecified: Secondary | ICD-10-CM

## 2015-10-06 LAB — CBC
Hematocrit: 35.3 % (ref 34.0–46.6)
Hemoglobin: 11.3 g/dL (ref 11.1–15.9)
MCH: 24.8 pg — ABNORMAL LOW (ref 26.6–33.0)
MCHC: 32 g/dL (ref 31.5–35.7)
MCV: 78 fL — ABNORMAL LOW (ref 79–97)
PLATELETS: 294 10*3/uL (ref 150–379)
RBC: 4.55 x10E6/uL (ref 3.77–5.28)
RDW: 20.3 % — AB (ref 12.3–15.4)
WBC: 5.6 10*3/uL (ref 3.4–10.8)

## 2015-10-06 LAB — VITAMIN D 25 HYDROXY (VIT D DEFICIENCY, FRACTURES): Vit D, 25-Hydroxy: 13.7 ng/mL — ABNORMAL LOW (ref 30.0–100.0)

## 2015-10-06 LAB — IRON: IRON: 41 ug/dL (ref 27–159)

## 2015-10-06 MED ORDER — VITAMIN D (ERGOCALCIFEROL) 1.25 MG (50000 UNIT) PO CAPS: 50000.0000 [IU] | ORAL_CAPSULE | ORAL | Status: DC

## 2015-10-07 ENCOUNTER — Telehealth: Payer: Self-pay | Admitting: *Deleted

## 2015-10-07 NOTE — Telephone Encounter (Signed)
-----   Message from Purcell NailsMelody N Shambley, [place] sent at 10/06/2015  8:14 AM EST ----- Please let her know she is no longer anemic, but has very low vit D. I sent her in prescription vit d to take 2 x week. i will recheck her levels at physical. Please mail info on vit D def.

## 2015-10-07 NOTE — Telephone Encounter (Signed)
Notified pt of results 

## 2015-11-29 ENCOUNTER — Telehealth: Payer: Self-pay | Admitting: Obstetrics and Gynecology

## 2015-11-29 NOTE — Telephone Encounter (Signed)
PT IS HAVING PAIN AND WANTS TO TALK TO YOU ABOUT IT

## 2015-12-16 ENCOUNTER — Ambulatory Visit (INDEPENDENT_AMBULATORY_CARE_PROVIDER_SITE_OTHER): Payer: BLUE CROSS/BLUE SHIELD | Admitting: Family Medicine

## 2015-12-16 ENCOUNTER — Encounter: Payer: Self-pay | Admitting: Family Medicine

## 2015-12-16 ENCOUNTER — Other Ambulatory Visit: Payer: Self-pay | Admitting: Family Medicine

## 2015-12-16 VITALS — BP 106/72 | HR 94 | Temp 97.4°F | Resp 16 | Ht 65.0 in | Wt 165.0 lb

## 2015-12-16 DIAGNOSIS — G8929 Other chronic pain: Secondary | ICD-10-CM | POA: Diagnosis not present

## 2015-12-16 DIAGNOSIS — M545 Low back pain, unspecified: Secondary | ICD-10-CM | POA: Insufficient documentation

## 2015-12-16 DIAGNOSIS — F3342 Major depressive disorder, recurrent, in full remission: Secondary | ICD-10-CM | POA: Diagnosis not present

## 2015-12-16 MED ORDER — SERTRALINE HCL 100 MG PO TABS: 100.0000 mg | ORAL_TABLET | Freq: Every day | ORAL | Status: DC

## 2015-12-16 NOTE — Progress Notes (Signed)
Name: Rachel [name]   MRN: [id number]    DOB: 08/23/1981   Date:12/16/2015       Progress Note  Subjective  Chief Complaint  Chief Complaint  Patient presents with  . Medication Refill  . Depression    HPI  Rachel [name] is a 35 year old female who last saw me 01/2015 at which time she had become pregnant with her 2nd child. Since then she had been seeing her OB/GYN providers regularly and reports having her baby in November 2016 via C-section. Doing well post partum.   If you may recall Rachel [name] complained of back pain for many years at our previous visits however self discontinued Percocet 5-325mg  which she was taking every 6 hrs as prescribed back in April 2016 when she got pregnant. X-rays of the lumbar spine done some years ago were unremarkable. Today she reports that back pain is there, especially lifting a heavy car seat plus child and is requesting pain medication again.   Patient also battles with depression and anxious moods. Onset was approximately several years ago, when her first husband and father passed away, stable since that time.  She denies current suicidal and homicidal plan or intent.   Family history significant for anxiety and depression. Possible organic causes contributing are: endocrine/metabolic, nutritional.  Risk factors: previous episode of depression. Previous treatment includes Zoloft and individual therapy. She complains of the following side effects from the treatment: none. She is currently back on Zoloft 50 mg a day and would like to go back to the 100mg  a day dose she was previously on.    Past Medical History  Diagnosis Date  . Anxiety   . Depression     Patient Active Problem List   Diagnosis Date Noted  . Anemia affecting pregnancy 06/01/2015  . Major depressive disorder, recurrent, in full remission with anxious distress (HCC) 03/06/2015    Social History  Substance Use Topics  . Smoking status: Never Smoker   . Smokeless  tobacco: Never Used  . Alcohol Use: No     Current outpatient prescriptions:  .  acyclovir (ZOVIRAX) 400 MG tablet, Take 1 tablet (400 mg total) by mouth 3 (three) times daily. (Patient not taking: Reported on 08/25/2015), Disp: 21 tablet, Rfl: 0 .  Cholecalciferol (VITAMIN D) 2000 UNITS CAPS, Take 1 capsule (2,000 Units total) by mouth daily. (Patient not taking: Reported on 10/05/2015), Disp: 120 capsule, Rfl: 3 .  docusate sodium (COLACE) 100 MG capsule, Take 1 capsule (100 mg total) by mouth 2 (two) times daily., Disp: 100 capsule, Rfl: 1 .  Prenatal Vit-Fe Fumarate-FA (PRENATAL MULTIVITAMIN) TABS tablet, Take 1 tablet by mouth daily at 12 noon., Disp: 30 tablet, Rfl: 0 .  sertraline (ZOLOFT) 50 MG tablet, Take 1 tablet (50 mg total) by mouth at bedtime. Increase to two tablets daily after one week, Disp: 60 tablet, Rfl: 1  Past Surgical History  Procedure Laterality Date  . Ganglion cyst excision Right wrist  . Cesarean section N/A 08/26/2015    Procedure: CESAREAN SECTION;  Surgeon: Hildred LaserAnika Cherry, MD;  Location: [facility] ORS;  Service: Obstetrics;  Laterality: N/A;    Family History  Problem Relation Age of Onset  . Heart disease Mother   . Heart disease Father     Allergies  Allergen Reactions  . Penicillins Anaphylaxis  . Sulfa Antibiotics Rash     Review of Systems  CONSTITUTIONAL: No significant weight changes, fever, chills, weakness or fatigue. [name].  CARDIOVASCULAR: No chest pain,  chest pressure or chest discomfort. No palpitations or edema.  RESPIRATORY: No shortness of breath, cough or sputum.  GASTROINTESTINAL: No anorexia, nausea, vomiting. No changes in bowel habits. No abdominal pain or blood.  GENITOURINARY: No dysuria. No frequency. No discharge.  NEUROLOGICAL: No headache, dizziness, syncope, paralysis, ataxia, numbness or tingling in the extremities. No memory changes. No change in bowel or bladder control.  MUSCULOSKELETAL: Chronic back joint pain. No muscle  pain. HEMATOLOGIC: No anemia, bleeding or bruising.  LYMPHATICS: No enlarged lymph nodes.  PSYCHIATRIC: No change in mood. No change in sleep pattern.  ENDOCRINOLOGIC: No reports of sweating, cold or heat intolerance. No polyuria or polydipsia.     Objective  BP 106/72 mmHg  Pulse 94  Temp(Src) 97.4 F (36.3 C) (Oral)  Resp 16  Ht  (1.651 m)  Wt 165 lb (74.844 kg)  BMI 27.46 kg/m2  SpO2 96% Body mass index is 27.46 kg/(m^2).  Physical Exam  Constitutional: Patient appears well-developed and well-nourished. In no distress.  Cardiovascular: Normal rate, regular rhythm and normal heart sounds.  No murmur heard.  Pulmonary/Chest: Effort normal and breath sounds normal. No respiratory distress. Musculoskeletal: Normal range of motion bilateral UE and LE, no joint effusions. Peripheral vascular: Bilateral LE no edema. Neurological: CN II-XII grossly intact with no focal deficits. Alert and oriented to person, place, and time. Coordination, balance, strength, speech and gait are normal.  Skin: Skin is warm and dry. No rash noted. No erythema.  Psychiatric: Patient has a stable mood and affect. Behavior is normal in office today. Judgment and thought content normal in office today.   Recent Results (from the past 2160 hour(s))  CBC     Status: Abnormal   Collection Time: 10/05/15  4:39 PM  Result Value Ref Range   WBC 5.6 3.4 - 10.8 x10E3/uL   RBC 4.55 3.77 - 5.28 x10E6/uL   Hemoglobin 11.3 11.1 - 15.9 g/dL   Hematocrit 16.1 09.6 - 46.6 %   MCV 78 (L) 79 - 97 fL   MCH 24.8 (L) 26.6 - 33.0 pg   MCHC 32.0 31.5 - 35.7 g/dL   RDW 04.5 (H) 40.9 - 81.1 %   Platelets 294 150 - 379 x10E3/uL  Iron     Status: None   Collection Time: 10/05/15  4:39 PM  Result Value Ref Range   Iron 41 27 - 159 ug/dL  Vitamin D (25 hydroxy)     Status: Abnormal   Collection Time: 10/05/15  4:39 PM  Result Value Ref Range   Vit D, 25-Hydroxy 13.7 (L) 30.0 - 100.0 ng/mL    Comment: Vitamin D  deficiency has been defined by the Institute of Medicine and an Endocrine Society practice guideline as a level of serum 25-OH vitamin D less than 20 ng/mL (1,2). The Endocrine Society went on to further define vitamin D insufficiency as a level between 21 and 29 ng/mL (2). 1. IOM (Institute of Medicine). 2010. Dietary reference    intakes for calcium and D. [place]: The    [employer]. 2. Holick MF, Binkley Clyde, Bischoff-Ferrari HA, et al.    Evaluation, treatment, and prevention of vitamin D    deficiency: an Endocrine Society clinical practice    guideline. JCEM. 2011 Jul; 96(7):1911-30.      Assessment & Plan   1. Major depressive disorder, recurrent, in full remission with anxious distress (HCC) Doing well, increased Zoloft to 100 mg a day.   - sertraline (ZOLOFT) 100 MG tablet;  Take 1 tablet (100 mg total) by mouth daily. Increase to two tablets daily after one week  Dispense: 90 tablet; Refill: 1  2. Chronic low back pain I have declined to place Rachel [name] back on opioid medication as I see no clinical evidence to require such strong medication on a regular basis especially since she was comfortably off the medication all this time during her pregnancy.   She is to utilize NSAIDs prn, weight management, chiropractor therapy.

## 2016-01-24 ENCOUNTER — Encounter: Payer: Self-pay | Admitting: Family Medicine

## 2016-01-24 ENCOUNTER — Ambulatory Visit (INDEPENDENT_AMBULATORY_CARE_PROVIDER_SITE_OTHER): Payer: BLUE CROSS/BLUE SHIELD | Admitting: Family Medicine

## 2016-01-24 VITALS — BP 108/70 | HR 81 | Temp 97.5°F | Resp 14 | Wt 169.0 lb

## 2016-01-24 DIAGNOSIS — M545 Low back pain: Secondary | ICD-10-CM | POA: Diagnosis not present

## 2016-01-24 DIAGNOSIS — F3340 Major depressive disorder, recurrent, in remission, unspecified: Secondary | ICD-10-CM

## 2016-01-24 DIAGNOSIS — G8929 Other chronic pain: Secondary | ICD-10-CM | POA: Diagnosis not present

## 2016-01-24 DIAGNOSIS — B001 Herpesviral vesicular dermatitis: Secondary | ICD-10-CM

## 2016-01-24 MED ORDER — ACYCLOVIR 400 MG PO TABS: 400.0000 mg | ORAL_TABLET | Freq: Three times a day (TID) | ORAL | Status: DC
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
## 2016-01-24 NOTE — Patient Instructions (Addendum)
Try turmeric as a natural anti-inflammatory (for pain and arthritis). It comes in capsules where you buy aspirin and fish oil, but also as a spice where you buy pepper and garlic powder. Try topical like biofreeze Try temperature like heat or ice, 15-20 minutes at a time, 3-4 x a day; always use protective cloth or fabric between temperature source and skin If you need something for aches or pains, try to use Tylenol (acetaminophen) Okay to use Aleve 220 mg, 1-2 every 12 hours; take with food We'll refer you to the pain clinic and a physical therapist Call if needed

## 2016-01-24 NOTE — Assessment & Plan Note (Signed)
I do not recommend narcotics long-term for chronic pain; will try aleve BID, turmeric, and topicals; ice topically if inflammed, heat if tight; refer to pain clinic to see if TENS unit or injections would be reasonable; she is in full agreement of plan

## 2016-02-03 ENCOUNTER — Ambulatory Visit (INDEPENDENT_AMBULATORY_CARE_PROVIDER_SITE_OTHER): Payer: BLUE CROSS/BLUE SHIELD | Admitting: Obstetrics and Gynecology

## 2016-02-03 ENCOUNTER — Encounter: Payer: Self-pay | Admitting: Obstetrics and Gynecology

## 2016-02-03 ENCOUNTER — Other Ambulatory Visit: Payer: Self-pay | Admitting: Obstetrics and Gynecology

## 2016-02-03 VITALS — BP 115/79 | HR 73 | Ht 65.0 in | Wt 170.9 lb

## 2016-02-03 DIAGNOSIS — N946 Dysmenorrhea, unspecified: Secondary | ICD-10-CM

## 2016-02-03 DIAGNOSIS — E559 Vitamin D deficiency, unspecified: Secondary | ICD-10-CM | POA: Diagnosis not present

## 2016-02-03 DIAGNOSIS — N941 Unspecified dyspareunia: Secondary | ICD-10-CM | POA: Diagnosis not present

## 2016-02-03 DIAGNOSIS — Z01419 Encounter for gynecological examination (general) (routine) without abnormal findings: Secondary | ICD-10-CM | POA: Diagnosis not present

## 2016-02-03 DIAGNOSIS — F32A Depression, unspecified: Secondary | ICD-10-CM

## 2016-02-03 DIAGNOSIS — F329 Major depressive disorder, single episode, unspecified: Secondary | ICD-10-CM

## 2016-02-03 DIAGNOSIS — R635 Abnormal weight gain: Secondary | ICD-10-CM

## 2016-02-03 NOTE — Progress Notes (Signed)
Subjective:   Rachel [name] is a 35 y.o. G63P2002 Caucasian female here for a routine well-woman exam.  Patient's last menstrual period was 01/11/2016 (approximate).    Current complaints: low back and heavy painful menses with pain with sex PCP: Sudaram       does desire labs  Social History: Sexual: heterosexual Marital Status: married Living situation: with family Occupation: homemaker Tobacco/alcohol: no tobacco use Illicit drugs: no history of illicit drug use  The following portions of the patient's history were reviewed and updated as appropriate: allergies, current medications, past family history, past medical history, past social history, past surgical history and problem list.  Past Medical History Past Medical History  Diagnosis Date  . Anxiety   . Depression     Past Surgical History Past Surgical History  Procedure Laterality Date  . Ganglion cyst excision Right wrist  . Cesarean section N/A 08/26/2015    Procedure: CESAREAN SECTION;  Surgeon: Hildred Laser, MD;  Location: [facility] ORS;  Service: Obstetrics;  Laterality: N/A;    Gynecologic History [id number]  Patient's last menstrual period was 01/11/2016 (approximate). Contraception: condoms Last Pap: 2015. Results were: normal   Obstetric History OB History  Gravida Para Term Preterm AB SAB TAB Ectopic Multiple Living  0 0 0 0 0 0 2    # Outcome Date GA Lbr Len/2nd Weight Sex Delivery Anes PTL Lv  2 Term 08/26/15 [redacted]w[redacted]d  10 lb 1.2 oz (4.57 kg) M CS-LTranv   Y  1 Term 2013    M Vag-Spont   Y      Current Medications Current Outpatient Prescriptions on File Prior to Visit  Medication Sig Dispense Refill  . sertraline (ZOLOFT) 100 MG tablet Take 1 tablet (100 mg total) by mouth daily. Increase to two tablets daily after one week 90 tablet 1  . acyclovir (ZOVIRAX) 400 MG tablet Take 1 tablet (400 mg total) by mouth 3 (three) times daily. (Patient not taking: Reported on 02/03/2016) 21 tablet 0  .  docusate sodium (COLACE) 100 MG capsule Take 1 capsule (100 mg total) by mouth 2 (two) times daily. (Patient not taking: Reported on 02/03/2016) 100 capsule 1  . Prenatal Vit-Fe Fumarate-FA (PRENATAL MULTIVITAMIN) TABS tablet Take 1 tablet by mouth daily at 12 noon. (Patient not taking: Reported on 02/03/2016) 30 tablet 0   No current facility-administered medications on file prior to visit.    Review of Systems Patient denies any headaches, blurred vision, shortness of breath, chest pain, abdominal pain, problems with bowel movements, urination, or intercourse.  Objective:  BP 115/79 mmHg  Pulse 73  Ht  (1.651 m)  Wt 170 lb 14.4 oz (77.52 kg)  BMI 28.44 kg/m2  LMP 01/11/2016 (Approximate)  Breastfeeding? Yes Physical Exam  General:  Well developed, well nourished, no acute distress. She is alert and oriented x3. Skin:  Warm and dry Neck:  Midline trachea, no thyromegaly or nodules Cardiovascular: Regular rate and rhythm, no murmur heard Lungs:  Effort normal, all lung fields clear to auscultation bilaterally Breasts:  No dominant palpable mass, retraction, or nipple discharge Abdomen:  Soft, non tender, no hepatosplenomegaly or masses Pelvic:  External genitalia is normal in appearance.  The vagina is normal in appearance. The cervix is bulbous, no CMT.  Thin prep pap is done with HR HPV cotesting. Uterus is felt to be normal size, shape, and contour.  No adnexal masses or tenderness noted. Extremities:  No swelling or varicosities noted Psych:  She  has a normal mood and affect  Assessment:   Healthy well-woman exam Depression under good control with SSRI Weight gain, unexplained dysparenia Dysmenorrhea Low back pain Breastfeeding Vit D Deficiency   Plan:  Labs ordered Referred to chiropractor Will consider pelvic floor PT at 1 year if pain persist  F/U 1 year  for AE, or sooner if needed   Derryl Uher Suzan NailerN Taras Rask, CNM

## 2016-02-03 NOTE — Patient Instructions (Signed)
  Place annual gynecologic exam patient instructions here.  Thank you for enrolling in MyChart. Please follow the instructions below to securely access your online medical record. MyChart allows you to send messages to your doctor, view your test results, manage appointments, and more.   How Do I Sign Up? 1. In your Internet browser, go to [employer] and enter [email or web address]. 2. Click on the Sign Up Now link in the Sign In box. You will see the New Member Sign Up page. 3. Enter your MyChart Access Code exactly as it appears below. You will not need to use this code after you've completed the sign-up process. If you do not sign up before the expiration date, you must request a new code.  MyChart Access Code: [email or web address] Expires: 02/07/2016 10:13 AM  4. Enter your Social Security Number ([id number]) and Date of Birth (mm/dd/yyyy) as indicated and click Submit. You will be taken to the next sign-up page. 5. Create a MyChart ID. This will be your MyChart login ID and cannot be changed, so think of one that is secure and easy to remember. 6. Create a MyChart password. You can change your password at any time. 7. Enter your Password Reset Question and Answer. This can be used at a later time if you forget your password.  8. Enter your e-mail address. You will receive e-mail notification when new information is available in MyChart. 9. Click Sign Up. You can now view your medical record.   Additional Information Remember, MyChart is NOT to be used for urgent needs. For medical emergencies, dial 911.

## 2016-02-06 LAB — CYTOLOGY - PAP

## 2016-02-12 ENCOUNTER — Encounter: Payer: Self-pay | Admitting: Family Medicine

## 2016-02-12 DIAGNOSIS — B001 Herpesviral vesicular dermatitis: Secondary | ICD-10-CM

## 2016-02-12 HISTORY — DX: Herpesviral vesicular dermatitis: B00.1

## 2016-02-12 NOTE — Assessment & Plan Note (Signed)
Patient to consult with pharmacist before using since she is breastfeeding

## 2016-02-12 NOTE — Assessment & Plan Note (Signed)
SSRI

## 2016-07-02 ENCOUNTER — Other Ambulatory Visit: Payer: Self-pay

## 2016-07-02 DIAGNOSIS — F3342 Major depressive disorder, recurrent, in full remission: Secondary | ICD-10-CM

## 2016-07-03 ENCOUNTER — Other Ambulatory Visit: Payer: Self-pay

## 2016-07-03 DIAGNOSIS — F3342 Major depressive disorder, recurrent, in full remission: Secondary | ICD-10-CM

## 2016-07-03 MED ORDER — SERTRALINE HCL 100 MG PO TABS: 100.0000 mg | ORAL_TABLET | Freq: Every day | ORAL | 0 refills | Status: DC

## 2016-07-03 NOTE — Telephone Encounter (Signed)
Last Rx for sertraline was written 12/16/15 and was for 90+1; patient is obvious not taking two pills a day with that Rx hx Will write for one a day; appt if otherwise

## 2016-07-11 ENCOUNTER — Ambulatory Visit (INDEPENDENT_AMBULATORY_CARE_PROVIDER_SITE_OTHER): Payer: BLUE CROSS/BLUE SHIELD

## 2016-07-11 ENCOUNTER — Encounter: Payer: Self-pay | Admitting: Obstetrics and Gynecology

## 2016-07-11 ENCOUNTER — Ambulatory Visit (INDEPENDENT_AMBULATORY_CARE_PROVIDER_SITE_OTHER): Payer: BLUE CROSS/BLUE SHIELD | Admitting: Obstetrics and Gynecology

## 2016-07-11 VITALS — BP 117/78 | HR 103 | Ht 65.0 in | Wt 173.3 lb

## 2016-07-11 DIAGNOSIS — N83201 Unspecified ovarian cyst, right side: Secondary | ICD-10-CM | POA: Diagnosis not present

## 2016-07-11 DIAGNOSIS — Q513 Bicornate uterus: Secondary | ICD-10-CM

## 2016-07-11 DIAGNOSIS — R102 Pelvic and perineal pain unspecified side: Secondary | ICD-10-CM

## 2016-07-11 LAB — POCT URINALYSIS DIPSTICK
Bilirubin, UA: NEGATIVE
Blood, UA: NEGATIVE
Ketones, UA: NEGATIVE
LEUKOCYTES UA: NEGATIVE
NITRITE UA: NEGATIVE
SPEC GRAV UA: 1.01
UROBILINOGEN UA: 0.2
pH, UA: 6.5

## 2016-07-11 MED ORDER — NORETHIN ACE-ETH ESTRAD-FE 1-20 MG-MCG PO TABS: 1.0000 | ORAL_TABLET | Freq: Every day | ORAL | 1 refills | Status: DC

## 2016-07-11 MED ORDER — IBUPROFEN 800 MG PO TABS: 800.0000 mg | ORAL_TABLET | Freq: Three times a day (TID) | ORAL | 1 refills | Status: DC | PRN

## 2016-07-11 NOTE — Patient Instructions (Signed)
Thank you for enrolling in MyChart. Please follow the instructions below to securely access your online medical record. MyChart allows you to send messages to your doctor, view your test results, renew your prescriptions, schedule appointments, and more.  How Do I Sign Up? 1. In your Internet browser, go to [email or web address] 2. Click on the New  User? link in the Sign In box.  3. Enter your MyChart Access Code exactly as it appears below. You will not need to use this code after you have completed the sign-up process. If you do not sign up before the expiration date, you must request a new code. MyChart Access Code: [email or web address] Expires: 09/09/2016  4:11 PM  4. Enter the last four digits of your Social Security Number (xxxx) and Date of Birth (mm/dd/yyyy) as indicated and click Next. You will be taken to the next sign-up page. 5. Create a MyChart ID. This will be your MyChart login ID and cannot be changed, so think of one that is secure and easy to remember. 6. Create a MyChart password. You can change your password at any time. 7. Enter your Password Reset Question and Answer and click Next. This can be used at a later time if you forget your password.  8. Select your communication preference, and if applicable enter your e-mail address. You will receive e-mail notification when new information is available in MyChart by choosing to receive e-mail notifications and filling in your e-mail. 9. Click Sign In. You can now view your medical record.   Additional Information If you have questions, you can email [email or web address] or call [phone] to talk to our MyChart staff. Remember, MyChart is NOT to be used for urgent needs. For medical emergencies, dial 911.   Ovarian Cyst An ovarian cyst is a fluid-filled sac that forms on an ovary. The ovaries are small organs that produce eggs in women. Various types of cysts can form on the ovaries. Most are not  cancerous. Many do not cause problems, and they often go away on their own. Some may cause symptoms and require treatment. Common types of ovarian cysts include:  Functional cysts--These cysts may occur every month during the menstrual cycle. This is normal. The cysts usually go away with the next menstrual cycle if the woman does not get pregnant. Usually, there are no symptoms with a functional cyst.  Endometrioma cysts--These cysts form from the tissue that lines the uterus. They are also called "chocolate cysts" because they become filled with blood that turns brown. This type of cyst can cause pain in the lower abdomen during intercourse and with your menstrual period.  Cystadenoma cysts--This type develops from the cells on the outside of the ovary. These cysts can get very big and cause lower abdomen pain and pain with intercourse. This type of cyst can twist on itself, cut off its blood supply, and cause severe pain. It can also easily rupture and cause a lot of pain.  Dermoid cysts--This type of cyst is sometimes found in both ovaries. These cysts may contain different kinds of body tissue, such as skin, teeth, hair, or cartilage. They usually do not cause symptoms unless they get very big.  Theca lutein cysts--These cysts occur when too much of a certain hormone (human chorionic gonadotropin) is produced and overstimulates the ovaries to produce an egg. This is most common after procedures used to assist with the conception of a baby (in vitro fertilization). CAUSES   Fertility drugs can  cause a condition in which multiple large cysts are formed on the ovaries. This is called ovarian hyperstimulation syndrome.  A condition called polycystic ovary syndrome can cause hormonal imbalances that can lead to nonfunctional ovarian cysts. SIGNS AND SYMPTOMS  Many ovarian cysts do not cause symptoms. If symptoms are present, they may include:  Pelvic pain or pressure.  Pain in the lower  abdomen.  Pain during sexual intercourse.  Increasing girth (swelling) of the abdomen.  Abnormal menstrual periods.  Increasing pain with menstrual periods.  Stopping having menstrual periods without being pregnant. DIAGNOSIS  These cysts are commonly found during a routine or annual pelvic exam. Tests may be ordered to find out more about the cyst. These tests may include:  Ultrasound.  X-ray of the pelvis.  CT scan.  MRI.  Blood tests. TREATMENT  Many ovarian cysts go away on their own without treatment. Your health care provider may want to check your cyst regularly for 2-3 months to see if it changes. For women in menopause, it is particularly important to monitor a cyst closely because of the higher rate of ovarian cancer in menopausal women. When treatment is needed, it may include any of the following:  A procedure to drain the cyst (aspiration). This may be done using a long needle and ultrasound. It can also be done through a laparoscopic procedure. This involves using a thin, lighted tube with a tiny camera on the end (laparoscope) inserted through a small incision.  Surgery to remove the whole cyst. This may be done using laparoscopic surgery or an open surgery involving a larger incision in the lower abdomen.  Hormone treatment or birth control pills. These methods are sometimes used to help dissolve a cyst. HOME CARE INSTRUCTIONS   Only take over-the-counter or prescription medicines as directed by your health care provider.  Follow up with your health care provider as directed.  Get regular pelvic exams and Pap tests. SEEK MEDICAL CARE IF:   Your periods are late, irregular, or painful, or they stop.  Your pelvic pain or abdominal pain does not go away.  Your abdomen becomes larger or swollen.  You have pressure on your bladder or trouble emptying your bladder completely.  You have pain during sexual intercourse.  You have feelings of fullness,  pressure, or discomfort in your stomach.  You lose weight for no apparent reason.  You feel generally ill.  You become constipated.  You lose your appetite.  You develop acne.  You have an increase in body and facial hair.  You are gaining weight, without changing your exercise and eating habits.  You think you are pregnant. SEEK IMMEDIATE MEDICAL CARE IF:   You have increasing abdominal pain.  You feel sick to your stomach (nauseous), and you throw up (vomit).  You develop a fever that comes on suddenly.  You have abdominal pain during a bowel movement.  Your menstrual periods become heavier than usual. MAKE SURE YOU:  Understand these instructions.  Will watch your condition.  Will get help right away if you are not doing well or get worse.   This information is not intended to replace advice given to you by your health care provider. Make sure you discuss any questions you have with your health care provider.   Document Released: 09/24/2005 Document Revised: 09/29/2013 Document Reviewed: 06/01/2013 Elsevier Interactive Patient Education [employer]2016 [employer] Inc.

## 2016-07-11 NOTE — Progress Notes (Signed)
Subjective:     Patient ID: Georganna SkeansBrittany M Kalil, female   DOB: 06/13/1981, 35 y.o.   MRN: [id number]  HPI Sudden onset RLQ pain this am, with radiating around to back. So bad it made her vomit. Comes in waves but never completely goes away. States she has never had this kind of pain before. Denies any urinary or bowel changes. Decreased appetite. Denies fever. LMP 3 weeks ago and normal.  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 Echo texture is homogenous without evidence of focal masses.  The right Endometrium measures 7.7 mm. The left Endometrium measures 9.4 mm.  Right Ovary measures 3.6 x 3.3 x 3.7 cm. There is a 1.9 x 1.5 x 1.8 cm complex area in the right ovary which may represent an involuting or ruptured cyst.  Left Ovary measures 3.4 x 2.0 x 2.2 cm, and appears WNL. Survey of the adnexa demonstrates no adnexal masses. There is a trace of free fluid in the cul de sac.        Assessment:     RLQ pelvic pain Impression: 1. Retroverted, septated uterus. Appears WNL. 2. Possible ruptured and involuting right ovarian cyst. Free fluid is seen around the cul-de-sac of the uterus.    Plan:     RX for OCP to take continuously until returns, and Ibuprofen 800mg  tid x 3 days then prn. RTC in 6-8 weeks for f/u u/s.   Melody BonnievilleShambley, CNM

## 2016-07-12 ENCOUNTER — Other Ambulatory Visit: Payer: Self-pay | Admitting: Obstetrics and Gynecology

## 2016-07-12 ENCOUNTER — Telehealth: Payer: Self-pay | Admitting: Obstetrics and Gynecology

## 2016-07-12 MED ORDER — OXYCODONE-ACETAMINOPHEN 5-325 MG PO TABS: 1.0000 | ORAL_TABLET | Freq: Four times a day (QID) | ORAL | 0 refills | Status: DC | PRN

## 2016-07-12 NOTE — Telephone Encounter (Signed)
rx for percocet given

## 2016-07-12 NOTE — Telephone Encounter (Signed)
PT CALLED AND SHE IS IN WORSE PAIN FROM YESTERDAY, IS REQUESTING A CALL BACK.

## 2016-07-12 NOTE — Telephone Encounter (Signed)
Pt states she is having severe abd pain, headache and vomiting non stop since yesterday, pls advise

## 2016-07-19 ENCOUNTER — Telehealth: Payer: Self-pay | Admitting: Obstetrics and Gynecology

## 2016-07-19 NOTE — Telephone Encounter (Signed)
Pt is still hurting on her right side, and in her pelvis, states the only way she can function her pain level is a 9, with medicine, she states she is some better, hurts worse when laying down or sitting down, pls advise

## 2016-07-19 NOTE — Telephone Encounter (Signed)
Pt called and would like a call back from you

## 2016-07-19 NOTE — Telephone Encounter (Signed)
Please have her come in sooner for another u/s as pain should be improving by now.

## 2016-07-20 ENCOUNTER — Other Ambulatory Visit: Payer: Self-pay | Admitting: *Deleted

## 2016-07-20 ENCOUNTER — Ambulatory Visit (INDEPENDENT_AMBULATORY_CARE_PROVIDER_SITE_OTHER): Payer: BLUE CROSS/BLUE SHIELD

## 2016-07-20 DIAGNOSIS — R102 Pelvic and perineal pain: Secondary | ICD-10-CM | POA: Diagnosis not present

## 2016-07-20 MED ORDER — OXYCODONE-ACETAMINOPHEN 5-325 MG PO TABS: 1.0000 | ORAL_TABLET | Freq: Four times a day (QID) | ORAL | 0 refills | Status: DC | PRN

## 2016-07-20 MED ORDER — IBUPROFEN 800 MG PO TABS: 800.0000 mg | ORAL_TABLET | Freq: Three times a day (TID) | ORAL | 1 refills | Status: DC | PRN

## 2016-07-20 NOTE — Telephone Encounter (Signed)
Called pt she will be here 07/20/16 @ 1:30

## 2016-07-26 ENCOUNTER — Encounter: Payer: Self-pay | Admitting: Family Medicine

## 2016-07-26 ENCOUNTER — Ambulatory Visit (INDEPENDENT_AMBULATORY_CARE_PROVIDER_SITE_OTHER): Payer: BLUE CROSS/BLUE SHIELD | Admitting: Family Medicine

## 2016-07-26 VITALS — BP 124/76 | HR 98 | Temp 98.0°F | Resp 16 | Ht 65.0 in | Wt 171.7 lb

## 2016-07-26 DIAGNOSIS — J029 Acute pharyngitis, unspecified: Secondary | ICD-10-CM | POA: Diagnosis not present

## 2016-07-26 DIAGNOSIS — R05 Cough: Secondary | ICD-10-CM | POA: Diagnosis not present

## 2016-07-26 DIAGNOSIS — R058 Other specified cough: Secondary | ICD-10-CM

## 2016-07-26 MED ORDER — AZITHROMYCIN 250 MG PO TABS: ORAL_TABLET | ORAL | 0 refills | Status: DC

## 2016-07-26 NOTE — Progress Notes (Signed)
Name: Rachel [name]   MRN: [id number]    DOB: 1981/02/05   Date:07/26/2016       Progress Note  Subjective  Chief Complaint  Chief Complaint  Patient presents with  . Cough    dry cough for 1 day  This patient is usually followed by Dr. Sherie Don, new to me  Cough  This is a new problem. The current episode started yesterday. The problem has been gradually worsening. The cough is non-productive. Associated symptoms include a sore throat. Pertinent negatives include no chills, fever, heartburn or shortness of breath. Associated symptoms comments: Burning in chest because of cough. Treatments tried: Has used FLonase but did not provide much relief.     Past Medical History:  Diagnosis Date  . Anxiety   . Depression   . Fever blister 02/12/2016    Past Surgical History:  Procedure Laterality Date  . CESAREAN SECTION N/A 08/26/2015   Procedure: CESAREAN SECTION;  Surgeon: Hildred Laser, MD;  Location: [facility] ORS;  Service: Obstetrics;  Laterality: N/A;  . GANGLION CYST EXCISION Right wrist    Family History  Problem Relation Age of Onset  . Heart disease Mother   . Heart disease Father     Social History   Social History  . Marital status: Single    Spouse name: N/A  . Number of children: N/A  . Years of education: N/A   Occupational History  . Not on file.   Social History Main Topics  . Smoking status: Never Smoker  . Smokeless tobacco: Never Used  . Alcohol use No  . Drug use: No  . Sexual activity: Yes    Birth control/ protection: None   Other Topics Concern  . Not on file   Social History Narrative   ** Merged History Encounter **         Current Outpatient Prescriptions:  .  acyclovir (ZOVIRAX) 400 MG tablet, Take 1 tablet (400 mg total) by mouth 3 (three) times daily., Disp: 21 tablet, Rfl: 0 .  ibuprofen (ADVIL,MOTRIN) 800 MG tablet, Take 1 tablet (800 mg total) by mouth every 8 (eight) hours as needed., Disp: 60 tablet, Rfl: 1 .   norethindrone-ethinyl estradiol (JUNEL FE 1/20) 1-20 MG-MCG tablet, Take 1 tablet by mouth daily., Disp: 3 Package, Rfl: 1 .  oxyCODONE-acetaminophen (PERCOCET/ROXICET) 5-325 MG tablet, Take 1-2 tablets by mouth every 6 (six) hours as needed., Disp: 30 tablet, Rfl: 0 .  sertraline (ZOLOFT) 100 MG tablet, Take 1 tablet (100 mg total) by mouth daily., Disp: 90 tablet, Rfl: 0  Allergies  Allergen Reactions  . Penicillins Anaphylaxis  . Sulfa Antibiotics Rash     Review of Systems  Constitutional: Negative for chills and fever.  HENT: Positive for sore throat.   Respiratory: Positive for cough. Negative for shortness of breath.   Gastrointestinal: Negative for heartburn.    Objective  Vitals:   07/26/16 1551  BP: 124/76  Pulse: 98  Resp: 16  Temp: 98 F (36.7 C)  TempSrc: Oral  SpO2: 98%  Weight: 171 lb 11.2 oz (77.9 kg)  Height: 5\' 5"  (1.651 m)    Physical Exam  Constitutional: She is oriented to person, place, and time and well-developed, well-nourished, and in no distress.  HENT:  Head: Normocephalic and atraumatic.  Right Ear: Tympanic membrane normal. No drainage or swelling.  Left Ear: Tympanic membrane normal. No drainage or swelling.  Mouth/Throat: Oropharyngeal exudate (white exudate on right oropharynx) and posterior oropharyngeal erythema present.  Cardiovascular:  Normal rate, regular rhythm, S1 normal, S2 normal and normal heart sounds.   Pulmonary/Chest: Effort normal and breath sounds normal. No respiratory distress. She has no wheezes.  Neurological: She is alert and oriented to person, place, and time.  Psychiatric: Mood, memory, affect and judgment normal.  Nursing note and vitals reviewed.     Assessment & Plan  1. Sore throat Point-of-care rapid strep test is negative - POCT rapid strep A  2. Dry cough Appears to be associated with bronchitis with some sinus drainage. We will prescribe azithromycin and advised patient to start taking antibiotic  if symptoms do not improve within 2-3 days with conservative therapy - azithromycin (ZITHROMAX) 250 MG tablet; 2 tabs po day 1, then 1 tab po q day x 4 days  Dispense: 6 tablet; Refill: 0   [name] Asad A. Faylene KurtzShah [facility] 07/26/2016 3:55 PM

## 2016-07-30 LAB — POCT RAPID STREP A (OFFICE): RAPID STREP A SCREEN: NEGATIVE

## 2016-07-31 ENCOUNTER — Encounter: Payer: Self-pay | Admitting: Pain Medicine

## 2016-07-31 ENCOUNTER — Ambulatory Visit: Payer: BLUE CROSS/BLUE SHIELD | Attending: Pain Medicine | Admitting: Pain Medicine

## 2016-07-31 VITALS — BP 104/59 | HR 86 | Temp 98.0°F | Resp 16 | Ht 65.0 in | Wt 170.0 lb

## 2016-07-31 DIAGNOSIS — F329 Major depressive disorder, single episode, unspecified: Secondary | ICD-10-CM | POA: Diagnosis not present

## 2016-07-31 DIAGNOSIS — F1190 Opioid use, unspecified, uncomplicated: Secondary | ICD-10-CM

## 2016-07-31 DIAGNOSIS — F419 Anxiety disorder, unspecified: Secondary | ICD-10-CM | POA: Diagnosis not present

## 2016-07-31 DIAGNOSIS — M79604 Pain in right leg: Secondary | ICD-10-CM | POA: Diagnosis not present

## 2016-07-31 DIAGNOSIS — Z79899 Other long term (current) drug therapy: Secondary | ICD-10-CM | POA: Insufficient documentation

## 2016-07-31 DIAGNOSIS — M25552 Pain in left hip: Secondary | ICD-10-CM | POA: Diagnosis not present

## 2016-07-31 DIAGNOSIS — M79605 Pain in left leg: Secondary | ICD-10-CM | POA: Insufficient documentation

## 2016-07-31 DIAGNOSIS — M545 Low back pain: Secondary | ICD-10-CM

## 2016-07-31 DIAGNOSIS — M25512 Pain in left shoulder: Secondary | ICD-10-CM | POA: Insufficient documentation

## 2016-07-31 DIAGNOSIS — Z880 Allergy status to penicillin: Secondary | ICD-10-CM | POA: Diagnosis not present

## 2016-07-31 DIAGNOSIS — Z882 Allergy status to sulfonamides status: Secondary | ICD-10-CM | POA: Diagnosis not present

## 2016-07-31 DIAGNOSIS — M25559 Pain in unspecified hip: Secondary | ICD-10-CM

## 2016-07-31 DIAGNOSIS — Z8249 Family history of ischemic heart disease and other diseases of the circulatory system: Secondary | ICD-10-CM | POA: Insufficient documentation

## 2016-07-31 DIAGNOSIS — G8929 Other chronic pain: Secondary | ICD-10-CM | POA: Diagnosis not present

## 2016-07-31 DIAGNOSIS — Z79891 Long term (current) use of opiate analgesic: Secondary | ICD-10-CM | POA: Insufficient documentation

## 2016-07-31 DIAGNOSIS — M25551 Pain in right hip: Secondary | ICD-10-CM | POA: Insufficient documentation

## 2016-07-31 NOTE — Progress Notes (Signed)
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 collapsed  lung(s), you may have blood or chyle, respectively.       6. Spinal headaches: They may occur with any procedures in the area of the spine.       7. Persistent CSF (Cerebro-Spinal Fluid) leakage: This is a rare problem, but may occur  with prolonged intrathecal or epidural catheters either due to the formation of a fistulous  track or a dural tear.       8. Nerve damage: By working so close to the spinal cord, there is always a possibility of  nerve damage, which could be as serious as a permanent spinal cord injury with  paralysis.       9. Death:  Although rare, severe deadly allergic reactions known as "Anaphylactic  reaction" can occur to any of the medications used.      10. Worsening of the symptoms:  We can always make thing worse.  What are the chances of something like this happening? Chances of any of this occuring are extremely low.  By statistics, you have more of a chance of getting killed in a motor vehicle accident: while driving to the hospital than any of the above occurring .   Nevertheless, you should be aware that they are possibilities.  In general, it is similar to taking a shower.  Everybody knows that you can slip, hit your head and get killed.  Does that mean that you should not shower again?  Nevertheless always keep in mind that statistics do not mean anything if you happen to be on the wrong side of them.  Even if a procedure has a 1 (one) in a 1,000,000 (million) chance of going wrong, it you happen to be that one..Also, keep in mind  that by statistics, you have more of a chance of having something go wrong when taking medications.  Who should not have this procedure? If you are on a blood thinning medication (e.g. Coumadin, Plavix, see list of "Blood Thinners"), or if you have an active infection going on, you should not have the procedure.  If you are taking any blood thinners, please inform your physician.  How should I prepare for this procedure?  Do not eat or drink anything at least six hours prior to the procedure.  Bring a driver with you .  It cannot be a taxi.  Come accompanied by an adult that can drive you back, and that is strong enough to help you if your legs get weak or numb from the local anesthetic.  Take all of your medicines the morning of the procedure with just enough water to swallow them.  If you have diabetes, make sure that you are scheduled to have your procedure done first thing in the morning, whenever possible.  If you have diabetes, take only half of your insulin dose and notify our nurse that you have done so as soon as you arrive at the clinic.  If you are diabetic, but only take blood sugar pills (oral hypoglycemic), then do not take them on the morning of your procedure.  You may take them after you have had the procedure.  Do not take aspirin or any aspirin-containing medications, at least eleven (11) days prior to the procedure.  They may prolong bleeding.  Wear loose fitting clothing that may be easy to take off and  that you would not mind if it got stained with Betadine or blood.  Do not wear any jewelry or perfume  Remove any nail coloring.  It will interfere with some of our monitoring equipment.  NOTE: Remember that this is not meant to be interpreted as a complete list of all possible complications.  Unforeseen problems may occur.  BLOOD THINNERS The following drugs contain aspirin or other products, which can cause increased bleeding during surgery and should not be taken for 2 weeks prior to and 1 week after surgery.  If you should need take something for relief of minor pain, you may take acetaminophen which is found in Tylenol,m Datril, Anacin-3 and Panadol. It is not blood thinner. The products listed below are.  Do not take any of the products listed below in addition to any listed on your instruction sheet.  A.P.C or A.P.C with Codeine Codeine Phosphate Capsules #3 Ibuprofen Ridaura  ABC compound Congesprin Imuran rimadil  Advil Cope Indocin Robaxisal  Alka-Seltzer Effervescent Pain Reliever and Antacid Coricidin or Coricidin-D  Indomethacin Rufen  Alka-Seltzer plus Cold Medicine Cosprin Ketoprofen S-A-C Tablets  Anacin Analgesic Tablets or Capsules Coumadin Korlgesic Salflex  Anacin Extra Strength Analgesic tablets or capsules CP-2 Tablets Lanoril Salicylate  Anaprox Cuprimine Capsules Levenox Salocol  Anexsia-D Dalteparin Magan Salsalate  Anodynos Darvon compound Magnesium Salicylate Sine-off  Ansaid Dasin Capsules Magsal Sodium Salicylate  Anturane Depen Capsules Marnal Soma  APF Arthritis pain formula Dewitt's Pills Measurin Stanback  Argesic Dia-Gesic Meclofenamic Sulfinpyrazone  Arthritis Bayer Timed Release Aspirin Diclofenac Meclomen Sulindac  Arthritis pain formula Anacin Dicumarol Medipren Supac  Analgesic (Safety coated) Arthralgen Diffunasal Mefanamic Suprofen  Arthritis Strength Bufferin Dihydrocodeine Mepro Compound Suprol  Arthropan liquid Dopirydamole Methcarbomol with  Aspirin Synalgos  ASA tablets/Enseals Disalcid Micrainin Tagament  Ascriptin Doan's Midol Talwin  Ascriptin A/D Dolene Mobidin Tanderil  Ascriptin Extra Strength Dolobid Moblgesic Ticlid  Ascriptin with Codeine Doloprin or Doloprin with Codeine Momentum Tolectin  Asperbuf Duoprin Mono-gesic Trendar  Aspergum Duradyne Motrin or Motrin IB Triminicin  Aspirin plain, buffered or enteric coated Durasal Myochrisine Trigesic  Aspirin Suppositories Easprin Nalfon Trillsate  Aspirin with Codeine Ecotrin Regular or Extra Strength Naprosyn Uracel  Atromid-S Efficin Naproxen Ursinus  Auranofin Capsules Elmiron Neocylate Vanquish  Axotal Emagrin Norgesic Verin  Azathioprine Empirin or Empirin with Codeine Normiflo Vitamin E  Azolid Emprazil Nuprin Voltaren  Bayer Aspirin plain, buffered or children's or timed BC Tablets or powders Encaprin Orgaran Warfarin Sodium  Buff-a-Comp Enoxaparin Orudis Zorpin  Buff-a-Comp with Codeine Equegesic Os-Cal-Gesic   Buffaprin Excedrin plain, buffered or Extra Strength Oxalid   Bufferin Arthritis Strength Feldene Oxphenbutazone   Bufferin plain or Extra Strength Feldene Capsules Oxycodone with Aspirin   Bufferin with Codeine Fenoprofen Fenoprofen Pabalate or Pabalate-SF   Buffets II Flogesic Panagesic   Buffinol plain or Extra Strength Florinal or Florinal with Codeine Panwarfarin   Buf-Tabs Flurbiprofen Penicillamine   Butalbital Compound Four-way cold tablets Penicillin   Butazolidin Fragmin Pepto-Bismol   Carbenicillin Geminisyn Percodan   Carna Arthritis Reliever Geopen Persantine   Carprofen Gold's salt Persistin   Chloramphenicol Goody's Phenylbutazone   Chloromycetin Haltrain Piroxlcam   Clmetidine heparin Plaquenil   Cllnoril Hyco-pap Ponstel   Clofibrate Hydroxy chloroquine Propoxyphen         Before stopping any of these medications, be sure to consult the physician who ordered them.  Some, such as Coumadin (Warfarin) are ordered to prevent or  treat serious conditions such as "deep thrombosis", "pumonary embolisms", and other heart problems.  The amount of time that you may need off of the medication may also vary with the medication and the reason for which you were taking it.  If you are taking any of these medications, please make sure you notify your pain physician before you undergo any procedures.         Facet Joint Block The facet joints connect the bones of the spine (vertebrae). They make it possible for you to bend, twist, and make other movements with your spine. They also prevent you from overbending, overtwisting, and making other excessive movements.  A facet joint block is a procedure where a numbing medicine (anesthetic) is injected into a facet joint. Often, a type of anti-inflammatory medicine called a steroid is also injected. A facet joint block may be done for two reasons:   Diagnosis. A facet joint block may be done as a test to see whether neck or back pain is caused by a worn-down or infected facet joint. If the pain gets better after a facet joint block, it means the pain is probably coming from the facet joint. If the pain does not get better, it means the pain is probably not coming from the facet joint.   Therapy. A facet joint block may be done to relieve neck or back pain caused by a facet joint. A facet joint block is only done as a therapy if the pain does not improve with medicine, exercise programs, physical therapy, and other forms of pain management. LET [facility] CARE PROVIDER KNOW ABOUT:   Any allergies you have.   All medicines you are taking, including vitamins, herbs, eyedrops, and over-the-counter medicines and creams.   Previous problems you or members of your family have had with the use of anesthetics.   Any blood disorders you have had.   Other health problems you have. RISKS AND COMPLICATIONS Generally,  having a facet joint block is safe. However, as with any procedure,  complications can occur. Possible complications associated with having a facet joint block include:   Bleeding.   Injury to a nerve near the injection site.   Pain at the injection site.   Weakness or numbness in areas controlled by nerves near the injection site.   Infection.   Temporary fluid retention.   Allergic reaction to anesthetics or medicines used during the procedure. BEFORE THE PROCEDURE   Follow your health care provider's instructions if you are taking dietary supplements or medicines. You may need to stop taking them or reduce your dosage.   Do not take any new dietary supplements or medicines without asking your health care provider first.   Follow your health care provider's instructions about eating and drinking before the procedure. You may need to stop eating and drinking several hours before the procedure.   Arrange to have an adult drive you home after the procedure. PROCEDURE  You may need to remove your clothing and dress in an open-back gown so that your health care provider can access your spine.   The procedure will be done while you are lying on an X-ray table. Most of the time you will be asked to lie on your stomach, but you may be asked to lie in a different position if an injection will be made in your neck.   Special machines will be used to monitor your oxygen levels, heart rate, and blood pressure.   If an injection will be made in your neck, an intravenous (IV) tube will be inserted into one of your veins. Fluids and medicine will flow directly into your body through the IV tube.   The area over the facet joint where the injection will be made will be cleaned with an antiseptic soap. The surrounding skin will be covered with sterile drapes.   An anesthetic will be applied to your skin to make the injection area numb. You may feel a temporary stinging or burning sensation.   A video X-ray machine will be used to locate the joint.  A contrast dye may be injected into the facet joint area to help with locating the joint.   When the joint is located, an anesthetic medicine will be injected into the joint through the needle.   Your health care provider will ask you whether you feel pain relief. If you do feel relief, a steroid may be injected to provide pain relief for a longer period of time. If you do not feel relief or feel only partial relief, additional injections of an anesthetic may be made in other facet joints.   The needle will be removed, the skin will be cleansed, and bandages will be applied.  AFTER THE PROCEDURE   You will be observed for 15-30 minutes before being allowed to go home. Do not drive. Have an adult drive you or take a taxi or public transportation instead.   If you feel pain relief, the pain will return in several hours or days when the anesthetic wears off.   You may feel pain relief 2-14 days after the procedure. The amount of time this relief lasts varies from person to person.   It is normal to feel some tenderness over the injected area(s) for 2 days following the procedure.   If you have diabetes, you may have a temporary increase in blood sugar.   This information is not intended to replace advice given to  you by your health care provider. Make sure you discuss any questions you have with your health care provider.   Document Released: 02/13/2007 Document Revised: 10/15/2014 Document Reviewed: 07/14/2012 [employer] Interactive Patient Education [employer].

## 2016-07-31 NOTE — Patient Instructions (Signed)
GENERAL RISKS AND COMPLICATIONS  What are the risk, side effects and possible complications? Generally speaking, most procedures are safe.  However, with any procedure there are risks, side effects, and the possibility of complications.  The risks and complications are dependent upon the sites that are lesioned, or the type of nerve block to be performed.  The closer the procedure is to the spine, the more serious the risks are.  Great care is taken when placing the radio frequency needles, block needles or lesioning probes, but sometimes complications can occur. 1. Infection: Any time there is an injection through the skin, there is a risk of infection.  This is why sterile conditions are used for these blocks.  There are four possible types of infection. 1. Localized skin infection. 2. Central Nervous System Infection-This can be in the form of Meningitis, which can be deadly. 3. Epidural Infections-This can be in the form of an epidural abscess, which can cause pressure inside of the spine, causing compression of the spinal cord with subsequent paralysis. This would require an emergency surgery to decompress, and there are no guarantees that the patient would recover from the paralysis. 4. Discitis-This is an infection of the intervertebral discs.  It occurs in about 1% of discography procedures.  It is difficult to treat and it may lead to surgery.        2. Pain: the needles have to go through skin and soft tissues, will cause soreness.       3. Damage to internal structures:  The nerves to be lesioned may be near blood vessels or    other nerves which can be potentially damaged.       4. Bleeding: Bleeding is more common if the patient is taking blood thinners such as  aspirin, Coumadin, Ticiid, Plavix, etc., or if he/she have some genetic predisposition  such as hemophilia. Bleeding into the spinal canal can cause compression of the spinal  cord with subsequent paralysis.  This would require an  emergency surgery to  decompress and there are no guarantees that the patient would recover from the  paralysis.       5. Pneumothorax:  Puncturing of a lung is a possibility, every time a needle is introduced in  the area of the chest or upper back.  Pneumothorax refers to free air around the  collapsed lung(s), inside of the thoracic cavity (chest cavity).  Another two possible  complications related to a similar event would include: Hemothorax and Chylothorax.   These are variations of the Pneumothorax, where instead of air around the collapsed  lung(s), you may have blood or chyle, respectively.       6. Spinal headaches: They may occur with any procedures in the area of the spine.       7. Persistent CSF (Cerebro-Spinal Fluid) leakage: This is a rare problem, but may occur  with prolonged intrathecal or epidural catheters either due to the formation of a fistulous  track or a dural tear.       8. Nerve damage: By working so close to the spinal cord, there is always a possibility of  nerve damage, which could be as serious as a permanent spinal cord injury with  paralysis.       9. Death:  Although rare, severe deadly allergic reactions known as "Anaphylactic  reaction" can occur to any of the medications used.      10. Worsening of the symptoms:  We can always make thing worse.    What are the chances of something like this happening? Chances of any of this occuring are extremely low.  By statistics, you have more of a chance of getting killed in a motor vehicle accident: while driving to the hospital than any of the above occurring .  Nevertheless, you should be aware that they are possibilities.  In general, it is similar to taking a shower.  Everybody knows that you can slip, hit your head and get killed.  Does that mean that you should not shower again?  Nevertheless always keep in mind that statistics do not mean anything if you happen to be on the wrong side of them.  Even if a procedure has a 1  (one) in a 1,000,000 (million) chance of going wrong, it you happen to be that one..Also, keep in mind that by statistics, you have more of a chance of having something go wrong when taking medications.  Who should not have this procedure? If you are on a blood thinning medication (e.g. Coumadin, Plavix, see list of "Blood Thinners"), or if you have an active infection going on, you should not have the procedure.  If you are taking any blood thinners, please inform your physician.  How should I prepare for this procedure?  Do not eat or drink anything at least six hours prior to the procedure.  Bring a driver with you .  It cannot be a taxi.  Come accompanied by an adult that can drive you back, and that is strong enough to help you if your legs get weak or numb from the local anesthetic.  Take all of your medicines the morning of the procedure with just enough water to swallow them.  If you have diabetes, make sure that you are scheduled to have your procedure done first thing in the morning, whenever possible.  If you have diabetes, take only half of your insulin dose and notify our nurse that you have done so as soon as you arrive at the clinic.  If you are diabetic, but only take blood sugar pills (oral hypoglycemic), then do not take them on the morning of your procedure.  You may take them after you have had the procedure.  Do not take aspirin or any aspirin-containing medications, at least eleven (11) days prior to the procedure.  They may prolong bleeding.  Wear loose fitting clothing that may be easy to take off and that you would not mind if it got stained with Betadine or blood.  Do not wear any jewelry or perfume  Remove any nail coloring.  It will interfere with some of our monitoring equipment.  NOTE: Remember that this is not meant to be interpreted as a complete list of all possible complications.  Unforeseen problems may occur.  BLOOD THINNERS The following drugs  contain aspirin or other products, which can cause increased bleeding during surgery and should not be taken for 2 weeks prior to and 1 week after surgery.  If you should need take something for relief of minor pain, you may take acetaminophen which is found in Tylenol,m Datril, Anacin-3 and Panadol. It is not blood thinner. The products listed below are.  Do not take any of the products listed below in addition to any listed on your instruction sheet.  A.P.C or A.P.C with Codeine Codeine Phosphate Capsules #3 Ibuprofen Ridaura  ABC compound Congesprin Imuran rimadil  Advil Cope Indocin Robaxisal  Alka-Seltzer Effervescent Pain Reliever and Antacid Coricidin or Coricidin-D  Indomethacin Rufen    Alka-Seltzer plus Cold Medicine Cosprin Ketoprofen S-A-C Tablets  Anacin Analgesic Tablets or Capsules Coumadin Korlgesic Salflex  Anacin Extra Strength Analgesic tablets or capsules CP-2 Tablets Lanoril Salicylate  Anaprox Cuprimine Capsules Levenox Salocol  Anexsia-D Dalteparin Magan Salsalate  Anodynos Darvon compound Magnesium Salicylate Sine-off  Ansaid Dasin Capsules Magsal Sodium Salicylate  Anturane Depen Capsules Marnal Soma  APF Arthritis pain formula Dewitt's Pills Measurin Stanback  Argesic Dia-Gesic Meclofenamic Sulfinpyrazone  Arthritis Bayer Timed Release Aspirin Diclofenac Meclomen Sulindac  Arthritis pain formula Anacin Dicumarol Medipren Supac  Analgesic (Safety coated) Arthralgen Diffunasal Mefanamic Suprofen  Arthritis Strength Bufferin Dihydrocodeine Mepro Compound Suprol  Arthropan liquid Dopirydamole Methcarbomol with Aspirin Synalgos  ASA tablets/Enseals Disalcid Micrainin Tagament  Ascriptin Doan's Midol Talwin  Ascriptin A/D Dolene Mobidin Tanderil  Ascriptin Extra Strength Dolobid Moblgesic Ticlid  Ascriptin with Codeine Doloprin or Doloprin with Codeine Momentum Tolectin  Asperbuf Duoprin Mono-gesic Trendar  Aspergum Duradyne Motrin or Motrin IB Triminicin  Aspirin  plain, buffered or enteric coated Durasal Myochrisine Trigesic  Aspirin Suppositories Easprin Nalfon Trillsate  Aspirin with Codeine Ecotrin Regular or Extra Strength Naprosyn Uracel  Atromid-S Efficin Naproxen Ursinus  Auranofin Capsules Elmiron Neocylate Vanquish  Axotal Emagrin Norgesic Verin  Azathioprine Empirin or Empirin with Codeine Normiflo Vitamin E  Azolid Emprazil Nuprin Voltaren  Bayer Aspirin plain, buffered or children's or timed BC Tablets or powders Encaprin Orgaran Warfarin Sodium  Buff-a-Comp Enoxaparin Orudis Zorpin  Buff-a-Comp with Codeine Equegesic Os-Cal-Gesic   Buffaprin Excedrin plain, buffered or Extra Strength Oxalid   Bufferin Arthritis Strength Feldene Oxphenbutazone   Bufferin plain or Extra Strength Feldene Capsules Oxycodone with Aspirin   Bufferin with Codeine Fenoprofen Fenoprofen Pabalate or Pabalate-SF   Buffets II Flogesic Panagesic   Buffinol plain or Extra Strength Florinal or Florinal with Codeine Panwarfarin   Buf-Tabs Flurbiprofen Penicillamine   Butalbital Compound Four-way cold tablets Penicillin   Butazolidin Fragmin Pepto-Bismol   Carbenicillin Geminisyn Percodan   Carna Arthritis Reliever Geopen Persantine   Carprofen Gold's salt Persistin   Chloramphenicol Goody's Phenylbutazone   Chloromycetin Haltrain Piroxlcam   Clmetidine heparin Plaquenil   Cllnoril Hyco-pap Ponstel   Clofibrate Hydroxy chloroquine Propoxyphen         Before stopping any of these medications, be sure to consult the physician who ordered them.  Some, such as Coumadin (Warfarin) are ordered to prevent or treat serious conditions such as "deep thrombosis", "pumonary embolisms", and other heart problems.  The amount of time that you may need off of the medication may also vary with the medication and the reason for which you were taking it.  If you are taking any of these medications, please make sure you notify your pain physician before you undergo any  procedures.         Facet Joint Block The facet joints connect the bones of the spine (vertebrae). They make it possible for you to bend, twist, and make other movements with your spine. They also prevent you from overbending, overtwisting, and making other excessive movements.  A facet joint block is a procedure where a numbing medicine (anesthetic) is injected into a facet joint. Often, a type of anti-inflammatory medicine called a steroid is also injected. A facet joint block may be done for two reasons:   Diagnosis. A facet joint block may be done as a test to see whether neck or back pain is caused by a worn-down or infected facet joint. If the pain gets better after a facet joint block, it means the   pain is probably coming from the facet joint. If the pain does not get better, it means the pain is probably not coming from the facet joint.   Therapy. A facet joint block may be done to relieve neck or back pain caused by a facet joint. A facet joint block is only done as a therapy if the pain does not improve with medicine, exercise programs, physical therapy, and other forms of pain management. LET YOUR HEALTH CARE PROVIDER KNOW ABOUT:   Any allergies you have.   All medicines you are taking, including vitamins, herbs, eyedrops, and over-the-counter medicines and creams.   Previous problems you or members of your family have had with the use of anesthetics.   Any blood disorders you have had.   Other health problems you have. RISKS AND COMPLICATIONS Generally, having a facet joint block is safe. However, as with any procedure, complications can occur. Possible complications associated with having a facet joint block include:   Bleeding.   Injury to a nerve near the injection site.   Pain at the injection site.   Weakness or numbness in areas controlled by nerves near the injection site.   Infection.   Temporary fluid retention.   Allergic reaction to  anesthetics or medicines used during the procedure. BEFORE THE PROCEDURE   Follow your health care provider's instructions if you are taking dietary supplements or medicines. You may need to stop taking them or reduce your dosage.   Do not take any new dietary supplements or medicines without asking your health care provider first.   Follow your health care provider's instructions about eating and drinking before the procedure. You may need to stop eating and drinking several hours before the procedure.   Arrange to have an adult drive you home after the procedure. PROCEDURE  You may need to remove your clothing and dress in an open-back gown so that your health care provider can access your spine.   The procedure will be done while you are lying on an X-ray table. Most of the time you will be asked to lie on your stomach, but you may be asked to lie in a different position if an injection will be made in your neck.   Special machines will be used to monitor your oxygen levels, heart rate, and blood pressure.   If an injection will be made in your neck, an intravenous (IV) tube will be inserted into one of your veins. Fluids and medicine will flow directly into your body through the IV tube.   The area over the facet joint where the injection will be made will be cleaned with an antiseptic soap. The surrounding skin will be covered with sterile drapes.   An anesthetic will be applied to your skin to make the injection area numb. You may feel a temporary stinging or burning sensation.   A video X-ray machine will be used to locate the joint. A contrast dye may be injected into the facet joint area to help with locating the joint.   When the joint is located, an anesthetic medicine will be injected into the joint through the needle.   Your health care provider will ask you whether you feel pain relief. If you do feel relief, a steroid may be injected to provide pain relief for a  longer period of time. If you do not feel relief or feel only partial relief, additional injections of an anesthetic may be made in other facet joints.     The needle will be removed, the skin will be cleansed, and bandages will be applied.  AFTER THE PROCEDURE   You will be observed for 15-30 minutes before being allowed to go home. Do not drive. Have an adult drive you or take a taxi or public transportation instead.   If you feel pain relief, the pain will return in several hours or days when the anesthetic wears off.   You may feel pain relief 2-14 days after the procedure. The amount of time this relief lasts varies from person to person.   It is normal to feel some tenderness over the injected area(s) for 2 days following the procedure.   If you have diabetes, you may have a temporary increase in blood sugar.   This information is not intended to replace advice given to you by your health care provider. Make sure you discuss any questions you have with your health care provider.   Document Released: 02/13/2007 Document Revised: 10/15/2014 Document Reviewed: 07/14/2012 Elsevier Interactive Patient Education 2016 [employer]  

## 2016-07-31 NOTE — Progress Notes (Signed)
Safety precautions to be maintained throughout the outpatient stay will include: orient to surroundings, keep bed in low position, maintain call bell within reach at all times, provide assistance with transfer out of bed and ambulation.  

## 2016-08-06 LAB — COMPLIANCE DRUG ANALYSIS, UR

## 2016-08-20 ENCOUNTER — Other Ambulatory Visit: Payer: Self-pay | Admitting: Obstetrics and Gynecology

## 2016-08-20 DIAGNOSIS — N83201 Unspecified ovarian cyst, right side: Secondary | ICD-10-CM

## 2016-08-22 ENCOUNTER — Ambulatory Visit: Payer: BLUE CROSS/BLUE SHIELD | Admitting: Obstetrics and Gynecology

## 2016-08-22 ENCOUNTER — Other Ambulatory Visit: Payer: BLUE CROSS/BLUE SHIELD

## 2016-10-06 ENCOUNTER — Other Ambulatory Visit: Payer: Self-pay | Admitting: Family Medicine

## 2016-10-06 DIAGNOSIS — F3342 Major depressive disorder, recurrent, in full remission: Secondary | ICD-10-CM

## 2016-10-10 NOTE — Telephone Encounter (Signed)
Glitch in the system; I was not getting electronic refill requests from Dec 13th until yesterday; addressed with IT; handling refill requests now --------------------- rx sent 

## 2016-11-05 ENCOUNTER — Ambulatory Visit (INDEPENDENT_AMBULATORY_CARE_PROVIDER_SITE_OTHER): Payer: BLUE CROSS/BLUE SHIELD | Admitting: Family Medicine

## 2016-11-05 ENCOUNTER — Encounter: Payer: Self-pay | Admitting: Family Medicine

## 2016-11-05 VITALS — BP 108/72 | HR 67 | Temp 97.5°F | Ht 65.0 in | Wt 177.8 lb

## 2016-11-05 DIAGNOSIS — M79605 Pain in left leg: Secondary | ICD-10-CM

## 2016-11-05 DIAGNOSIS — M25512 Pain in left shoulder: Secondary | ICD-10-CM | POA: Diagnosis not present

## 2016-11-05 DIAGNOSIS — M545 Low back pain, unspecified: Secondary | ICD-10-CM

## 2016-11-05 DIAGNOSIS — G8929 Other chronic pain: Secondary | ICD-10-CM | POA: Diagnosis not present

## 2016-11-05 DIAGNOSIS — F3340 Major depressive disorder, recurrent, in remission, unspecified: Secondary | ICD-10-CM

## 2016-11-05 DIAGNOSIS — M79604 Pain in right leg: Secondary | ICD-10-CM | POA: Diagnosis not present

## 2016-11-05 DIAGNOSIS — Z79891 Long term (current) use of opiate analgesic: Secondary | ICD-10-CM

## 2016-11-05 MED ORDER — OXYCODONE-ACETAMINOPHEN 10-325 MG PO TABS: ORAL_TABLET | ORAL | 0 refills | Status: DC

## 2016-11-05 MED ORDER — OXYCODONE-ACETAMINOPHEN 10-325 MG PO TABS
1.0000 | ORAL_TABLET | Freq: Three times a day (TID) | ORAL | 0 refills | Status: DC | PRN
Start: 2016-11-05 — End: 2016-11-05

## 2016-11-05 NOTE — Progress Notes (Signed)
Pre visit review using our clinic review tool, if applicable. No additional management support is needed unless otherwise documented below in the visit note. 
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
## 2016-11-08 ENCOUNTER — Ambulatory Visit: Payer: BLUE CROSS/BLUE SHIELD | Admitting: Family Medicine

## 2016-11-19 ENCOUNTER — Telehealth: Payer: Self-pay

## 2016-11-19 ENCOUNTER — Encounter: Payer: Self-pay | Admitting: Family Medicine

## 2016-11-19 MED ORDER — OSELTAMIVIR PHOSPHATE 75 MG PO CAPS: 75.0000 mg | ORAL_CAPSULE | Freq: Two times a day (BID) | ORAL | 0 refills | Status: DC

## 2016-11-19 NOTE — Telephone Encounter (Signed)
Patient called to follow up on message, advised that provider is still in clinic and will get to it ASAP.

## 2016-11-19 NOTE — Telephone Encounter (Signed)
-----   Message from Helane RimaErica Wallace, DO sent at 11/19/2016  1:13 PM EST ----- PLEASE CALL TAMIFLU TO [facility]

## 2016-11-19 NOTE — Telephone Encounter (Signed)
Tamiflu 75 mg BID, #10, 0 refills called in to [facility].

## 2016-11-22 ENCOUNTER — Encounter: Payer: Self-pay | Admitting: Family Medicine

## 2016-11-22 ENCOUNTER — Telehealth: Payer: Self-pay

## 2016-11-22 MED ORDER — GUAIFENESIN-CODEINE 100-10 MG/5ML PO SYRP: 10.0000 mL | ORAL_SOLUTION | Freq: Every evening | ORAL | 0 refills | Status: DC | PRN

## 2016-11-22 NOTE — Telephone Encounter (Signed)
[facility] [address]  Per patient request.

## 2017-01-18 ENCOUNTER — Other Ambulatory Visit: Payer: Self-pay | Admitting: Family Medicine

## 2017-01-18 DIAGNOSIS — F3342 Major depressive disorder, recurrent, in full remission: Secondary | ICD-10-CM

## 2017-01-28 ENCOUNTER — Encounter: Payer: Self-pay | Admitting: Family Medicine

## 2017-01-28 ENCOUNTER — Ambulatory Visit (INDEPENDENT_AMBULATORY_CARE_PROVIDER_SITE_OTHER): Payer: BLUE CROSS/BLUE SHIELD | Admitting: Family Medicine

## 2017-01-28 ENCOUNTER — Ambulatory Visit: Payer: BLUE CROSS/BLUE SHIELD | Admitting: Family Medicine

## 2017-01-28 VITALS — BP 116/74 | HR 97 | Temp 97.8°F | Ht 65.0 in | Wt 180.0 lb

## 2017-01-28 DIAGNOSIS — G8929 Other chronic pain: Secondary | ICD-10-CM

## 2017-01-28 DIAGNOSIS — F331 Major depressive disorder, recurrent, moderate: Secondary | ICD-10-CM

## 2017-01-28 MED ORDER — BUPROPION HCL ER (XL) 150 MG PO TB24: 150.0000 mg | ORAL_TABLET | Freq: Every day | ORAL | 3 refills | Status: DC

## 2017-01-28 MED ORDER — OXYCODONE-ACETAMINOPHEN 10-325 MG PO TABS: ORAL_TABLET | ORAL | 0 refills | Status: DC

## 2017-01-28 MED ORDER — SERTRALINE HCL 100 MG PO TABS: 100.0000 mg | ORAL_TABLET | Freq: Every day | ORAL | 0 refills | Status: DC

## 2017-01-28 NOTE — Progress Notes (Signed)
Rachel [name] is a 36 y.o. female is here to discuss:  History of Present Illness:   Rachel [name] CMA acting as scribe for Dr. Earlene Plater.  Chief Complaint  Patient presents with  . Medication Refill   HPI:  1. Chronic pain. Stable, general controlled on current regimen.  She takes her medication 1/2 QID. No reported side effects. No concerns today. Database reviewed without red flags.   2. Moderate episode of recurrent major depressive disorder (HCC). Depression symptoms: fatigue, sadness.  Current psychosocial stressors include: work, health, young children.  Treatment to date has included Medication. Symptoms have been gradually worsening since onset of treatment. Side effects from the treatment include: drowsiness.  Alcohol use: none.  Drug use: none.  Exercise: minimal.  Patient denies current suicidal and homicidal ideation.   There are no preventive care reminders to display for this patient.  PMHx, SurgHx, SocialHx, FamHx, Medications, and Allergies were reviewed in the Visit Navigator and updated as appropriate.   Patient Active Problem List   Diagnosis Date Noted  . Chronic pain 07/31/2016  . Long term current use of opiate analgesic 07/31/2016  . Long term prescription opiate use 07/31/2016  . Opiate use 07/31/2016  . 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 Chronic shoulder pain (Location of Secondary source of pain) (Left) 07/31/2016  . Chronic lower extremity pain (Location of Tertiary source of pain) (Bilateral) (R>L) 07/31/2016  . Fever blister 02/12/2016  . Chronic low back pain (Location of Primary Source of Pain) (Bilateral) (R>L) 12/16/2015  . Major depressive disorder, recurrent (HCC) 03/06/2015   Social History  Substance Use Topics  . Smoking status: Never Smoker  . Smokeless tobacco: Never Used  . Alcohol use No   Current Medications and Allergies:   .  acyclovir (ZOVIRAX) 400 MG tablet, Take 1 tablet (400 mg total) by mouth 3  (three) times daily., Disp: 21 tablet, Rfl: 0 .  ibuprofen (ADVIL,MOTRIN) 800 MG tablet, Take 1 tablet (800 mg total) by mouth every 8 (eight) hours as needed., Disp: 60 tablet, Rfl: 1 .  oxyCODONE-acetaminophen (PERCOCET) 10-325 MG tablet, 1/2 tab QID prn pain, Disp: 180 tablet, Rfl: 0 .  sertraline (ZOLOFT) 100 MG tablet, TAKE 1 TABLET (100 MG TOTAL) BY MOUTH DAILY., Disp: 90 tablet, Rfl: 0  Allergies  Allergen Reactions  . Penicillins Anaphylaxis  . Sulfa Antibiotics Rash   Review of Systems   Review of Systems  Constitutional: Negative for chills, fever and malaise/fatigue.  HENT: Negative for congestion, ear pain, sinus pain and sore throat.   Eyes: Negative for blurred vision and double vision.  Respiratory: Negative for cough, shortness of breath and wheezing.   Cardiovascular: Negative for chest pain, palpitations and leg swelling.  Gastrointestinal: Negative for abdominal pain, constipation and diarrhea.  Genitourinary: Negative for dysuria.  Musculoskeletal: Positive for back pain and joint pain.       Chronic.  Neurological: Negative for dizziness and headaches.  Psychiatric/Behavioral: Positive for depression. Negative for hallucinations and memory loss.    Vitals:   Vitals:   01/28/17 1207  BP: 116/74  Pulse: 97  Temp: 97.8 F (36.6 C)  TempSrc: Oral  SpO2: 98%  Weight: 180 lb (81.6 kg)  Height:  (1.651 m)     Body mass index is 29.95 kg/m.   Physical Exam:    Physical Exam  Constitutional: She appears well-developed and well-nourished. No distress.  HENT:  Head: Normocephalic and atraumatic.  Eyes: EOM are normal. Pupils are  equal, round, and reactive to light.  Neck: Normal range of motion. Neck supple.  Cardiovascular: Normal rate, regular rhythm, normal heart sounds and intact distal pulses.   Pulmonary/Chest: Effort normal.  Abdominal: Soft.  Skin: Skin is warm.  Psychiatric: She has a normal mood and affect. Her behavior is normal.    Nursing note and vitals reviewed.  Assessment and Plan:    [name] was seen today for medication refill.  Diagnoses and all orders for this visit:  Chronic pain Comments: Refill of medication today. Orders: -     oxyCODONE-acetaminophen (PERCOCET) 10-325 MG tablet; 1/2 tab QID prn pain  Moderate episode of recurrent major depressive disorder (HCC) Comments: Add Wellbutrin to Zoloft for activating effects. Reviewed benefits versus risks.  Orders: -     sertraline (ZOLOFT) 100 MG tablet; Take 1 tablet (100 mg total) by mouth daily. -     buPROPion (WELLBUTRIN XL) 150 MG 24 hr tablet; Take 1 tablet (150 mg total) by mouth daily. Start 150 mg ER PO qam, increase after 3 days to 150 mg ER PO BID.   [name] Reviewed expectations re: course of current medical issues. . Discussed self-management of symptoms. . Outlined signs and symptoms indicating need for more acute intervention. . Patient verbalized understanding and all questions were answered. . See orders for this visit as documented in the electronic medical record. . Patient received an After Visit Summary.   CMA served as Neurosurgeon during this visit. History, Physical, and Plan performed by medical provider. Documentation and orders reviewed and attested to. Helane Rima, D.O.  Helane Rima, D.O. [place] [place] 01/28/2017  Follow-up: No Follow-up on file.

## 2017-01-28 NOTE — Progress Notes (Signed)
Pre visit review using our clinic review tool, if applicable. No additional management support is needed unless otherwise documented below in the visit note. 

## 2017-02-05 ENCOUNTER — Encounter: Payer: BLUE CROSS/BLUE SHIELD | Admitting: Obstetrics and Gynecology

## 2017-02-25 ENCOUNTER — Encounter: Payer: Self-pay | Admitting: Family Medicine

## 2017-02-26 ENCOUNTER — Other Ambulatory Visit: Payer: Self-pay

## 2017-02-26 MED ORDER — SERTRALINE HCL 100 MG PO TABS: 200.0000 mg | ORAL_TABLET | Freq: Every day | ORAL | 1 refills | Status: DC

## 2017-04-04 ENCOUNTER — Encounter: Payer: Self-pay | Admitting: Family Medicine

## 2017-04-04 ENCOUNTER — Emergency Department: Payer: No Typology Code available for payment source

## 2017-04-04 ENCOUNTER — Emergency Department
Admission: EM | Admit: 2017-04-04 | Discharge: 2017-04-04 | Disposition: A | Discharge status: Home / Self Care | Payer: No Typology Code available for payment source | Attending: Emergency Medicine | Admitting: Emergency Medicine

## 2017-04-04 ENCOUNTER — Encounter: Payer: Self-pay | Admitting: Emergency Medicine

## 2017-04-04 DIAGNOSIS — M25512 Pain in left shoulder: Secondary | ICD-10-CM | POA: Insufficient documentation

## 2017-04-04 DIAGNOSIS — M79662 Pain in left lower leg: Secondary | ICD-10-CM | POA: Insufficient documentation

## 2017-04-04 DIAGNOSIS — Z79899 Other long term (current) drug therapy: Secondary | ICD-10-CM | POA: Diagnosis not present

## 2017-04-04 DIAGNOSIS — Y939 Activity, unspecified: Secondary | ICD-10-CM | POA: Diagnosis not present

## 2017-04-04 DIAGNOSIS — Y999 Unspecified external cause status: Secondary | ICD-10-CM | POA: Insufficient documentation

## 2017-04-04 DIAGNOSIS — M79632 Pain in left forearm: Secondary | ICD-10-CM | POA: Insufficient documentation

## 2017-04-04 DIAGNOSIS — Y929 Unspecified place or not applicable: Secondary | ICD-10-CM | POA: Diagnosis not present

## 2017-04-04 MED ORDER — NAPROXEN 500 MG PO TABS
500.0000 mg | ORAL_TABLET | Freq: Once | ORAL | Status: AC
  Administered 2017-04-04: 500 mg via ORAL
  Filled 2017-04-04: qty 1

## 2017-04-04 MED ORDER — NAPROXEN 500 MG PO TBEC: 500.0000 mg | DELAYED_RELEASE_TABLET | Freq: Two times a day (BID) | ORAL | 0 refills | Status: AC
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
## 2017-04-18 ENCOUNTER — Encounter: Payer: Self-pay | Admitting: Family Medicine

## 2017-04-18 ENCOUNTER — Ambulatory Visit (INDEPENDENT_AMBULATORY_CARE_PROVIDER_SITE_OTHER): Payer: Self-pay | Admitting: Family Medicine

## 2017-04-18 VITALS — BP 118/68 | HR 91 | Temp 98.2°F | Ht 65.0 in | Wt 179.4 lb

## 2017-04-18 DIAGNOSIS — M79605 Pain in left leg: Secondary | ICD-10-CM

## 2017-04-18 DIAGNOSIS — F341 Dysthymic disorder: Secondary | ICD-10-CM

## 2017-04-18 DIAGNOSIS — M542 Cervicalgia: Secondary | ICD-10-CM

## 2017-04-18 DIAGNOSIS — G8929 Other chronic pain: Secondary | ICD-10-CM

## 2017-04-18 MED ORDER — SERTRALINE HCL 100 MG PO TABS: 200.0000 mg | ORAL_TABLET | Freq: Every day | ORAL | 1 refills | Status: DC

## 2017-04-18 MED ORDER — CYCLOBENZAPRINE HCL 5 MG PO TABS: 5.0000 mg | ORAL_TABLET | Freq: Every evening | ORAL | 0 refills | Status: DC | PRN

## 2017-04-18 MED ORDER — OXYCODONE-ACETAMINOPHEN 10-325 MG PO TABS: 1.0000 | ORAL_TABLET | Freq: Three times a day (TID) | ORAL | 0 refills | Status: DC | PRN

## 2017-04-18 MED ORDER — OXYCODONE-ACETAMINOPHEN 10-325 MG PO TABS: 1.0000 | ORAL_TABLET | Freq: Two times a day (BID) | ORAL | 0 refills | Status: DC | PRN
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
## 2017-07-03 ENCOUNTER — Encounter: Payer: Self-pay | Admitting: Family Medicine

## 2017-07-03 ENCOUNTER — Ambulatory Visit (INDEPENDENT_AMBULATORY_CARE_PROVIDER_SITE_OTHER): Payer: BLUE CROSS/BLUE SHIELD | Admitting: Family Medicine

## 2017-07-03 VITALS — BP 124/72 | HR 98 | Temp 98.0°F | Ht 65.0 in | Wt 182.8 lb

## 2017-07-03 DIAGNOSIS — J029 Acute pharyngitis, unspecified: Secondary | ICD-10-CM

## 2017-07-03 LAB — POCT RAPID STREP A (OFFICE): Rapid Strep A Screen: NEGATIVE

## 2017-07-03 MED ORDER — PREDNISONE 5 MG PO TABS: ORAL_TABLET | ORAL | 0 refills | Status: DC

## 2017-07-03 MED ORDER — AZITHROMYCIN 500 MG PO TABS: 500.0000 mg | ORAL_TABLET | Freq: Every day | ORAL | 0 refills | Status: DC

## 2017-07-03 NOTE — Progress Notes (Signed)
   Rachel [name] is a 36 y.o. female here for an acute visit.  History of Present Illness:   Insurance claims handler, CMA, acting as scribe for Dr. Earlene Plater.  HPI:  Patient comes in today complaining of sore throat, fever, and body aches since yesterday.  States her 3 small children are also sick, one with + strep swab.  She has been alternating Motrin and Tylenol for pain and fever.    PMHx, SurgHx, SocialHx, Medications, and Allergies were reviewed in the Visit Navigator and updated as appropriate.  Current Medications:   Current Outpatient Prescriptions:  .  cyclobenzaprine (FLEXERIL) 5 MG tablet, Take 1 tablet (5 mg total) by mouth at bedtime as needed for muscle spasms., Disp: 20 tablet, Rfl: 0 .  oxyCODONE-acetaminophen (PERCOCET) 10-325 MG tablet, Take 1 tablet by mouth 2 (two) times daily as needed for pain., Disp: 60 tablet, Rfl: 0 .  sertraline (ZOLOFT) 100 MG tablet, Take 2 tablets (200 mg total) by mouth daily., Disp: 180 tablet, Rfl: 1   Allergies  Allergen Reactions  . Penicillins Anaphylaxis  . Sulfa Antibiotics Rash   Review of Systems:   Pertinent items are noted in the HPI. Otherwise, ROS is negative.  Vitals:   Vitals:   07/03/17 1439  BP: 124/72  Pulse: 98  Temp: 98 F (36.7 C)  TempSrc: Oral  SpO2: 97%  Weight: 182 lb 12.8 oz (82.9 kg)  Height:  (1.651 m)     Body mass index is 30.42 kg/m.   Physical Exam:   Physical Exam  Constitutional: She appears well-nourished.  HENT:  Head: Normocephalic and atraumatic.  Mouth/Throat: Posterior oropharyngeal erythema present.  Eyes: Pupils are equal, round, and reactive to light. EOM are normal.  Neck: Normal range of motion. Neck supple.  Cardiovascular: Normal rate, regular rhythm, normal heart sounds and intact distal pulses.   Pulmonary/Chest: Effort normal.  Abdominal: Soft.  Skin: Skin is warm.  Psychiatric: She has a normal mood and affect. Her behavior is normal.  Nursing note and vitals  reviewed.   Results for orders placed or performed in visit on 07/03/17  POCT rapid strep A  Result Value Ref Range   Rapid Strep A Screen Negative Negative    Assessment and Plan:   [name] was seen today for acute visit.  Diagnoses and all orders for this visit:  Sore throat -     POCT rapid strep A -     azithromycin (ZITHROMAX) 500 MG tablet; Take 1 tablet (500 mg total) by mouth daily. -     predniSONE (DELTASONE) 5 MG tablet; 6, 5, 4, 3, 2, 1, off   . Reviewed expectations re: course of current medical issues. . Discussed self-management of symptoms. . Outlined signs and symptoms indicating need for more acute intervention. . Patient verbalized understanding and all questions were answered. [name] Health Maintenance issues including appropriate healthy diet, exercise, and smoking avoidance were discussed with patient. . See orders for this visit as documented in the electronic medical record. . Patient received an After Visit Summary.  CMA served as Neurosurgeon during this visit. History, Physical, and Plan performed by medical provider. The above documentation has been reviewed and is accurate and complete. Helane Rima, D.O.  Helane Rima, DO [place], [facility] 07/06/2017

## 2017-07-04 ENCOUNTER — Encounter: Payer: Self-pay | Admitting: Family Medicine

## 2017-07-22 ENCOUNTER — Encounter: Payer: Self-pay | Admitting: Family Medicine

## 2017-07-22 ENCOUNTER — Ambulatory Visit (INDEPENDENT_AMBULATORY_CARE_PROVIDER_SITE_OTHER): Payer: BLUE CROSS/BLUE SHIELD | Admitting: Family Medicine

## 2017-07-22 VITALS — BP 124/74 | HR 80 | Temp 98.6°F | Ht 65.0 in | Wt 178.2 lb

## 2017-07-22 DIAGNOSIS — G8929 Other chronic pain: Secondary | ICD-10-CM

## 2017-07-22 DIAGNOSIS — Z23 Encounter for immunization: Secondary | ICD-10-CM

## 2017-07-22 MED ORDER — OXYCODONE-ACETAMINOPHEN 10-325 MG PO TABS: 1.0000 | ORAL_TABLET | Freq: Three times a day (TID) | ORAL | 0 refills | Status: DC | PRN

## 2017-07-22 MED ORDER — OXYCODONE-ACETAMINOPHEN 10-325 MG PO TABS: 1.0000 | ORAL_TABLET | Freq: Two times a day (BID) | ORAL | 0 refills | Status: DC | PRN

## 2017-07-22 NOTE — Progress Notes (Signed)
   Rachel [name] is a 36 y.o. female here for an acute visit.  History of Present Illness:   Insurance claims handler, CMA, acting as scribe for Dr. Earlene Plater.  HPI:   Reviewed and updated today. 07/22/2017   Indication for chronic opioid: LOW BACK, HIP, SHOULDER PAIN Medication and dose: PREVIOUSLY - 10/325 BID # pills per month: 60 Last UDS date: NONE Pain contract signed (Y/N): Y Date narcotic database last reviewed (include red flags): 07/22/2017   PMHx, SurgHx, SocialHx, Medications, and Allergies were reviewed in the Visit Navigator and updated as appropriate.  Current Medications:   .  oxyCODONE-acetaminophen (PERCOCET) 10-325 MG tablet, Take 1 tablet by mouth 2 (two) times daily as needed for pain., Disp: 60 tablet, Rfl: 0 .  sertraline (ZOLOFT) 100 MG tablet, Take 2 tablets (200 mg total) by mouth daily., Disp: 180 tablet, Rfl: 1   Allergies  Allergen Reactions  . Penicillins Anaphylaxis  . Sulfa Antibiotics Rash   Review of Systems:   Pertinent items are noted in the HPI. Otherwise, ROS is negative.  Vitals:   Vitals:   07/22/17 1336  BP: 124/74  Pulse: 80  Temp: 98.6 F (37 C)  TempSrc: Oral  SpO2: 98%  Weight: 178 lb 3.2 oz (80.8 kg)  Height:  (1.651 m)     Body mass index is 29.65 kg/m.   Physical Exam:   Physical Exam  Constitutional: She appears well-nourished.  HENT:  Head: Normocephalic and atraumatic.  Eyes: Pupils are equal, round, and reactive to light. EOM are normal.  Neck: Normal range of motion. Neck supple.  Cardiovascular: Normal rate, regular rhythm, normal heart sounds and intact distal pulses.   Pulmonary/Chest: Effort normal.  Abdominal: Soft.  Skin: Skin is warm.  Psychiatric: She has a normal mood and affect. Her behavior is normal.  Nursing note and vitals reviewed.  Assessment and Plan:   Rachel [name] was seen today for acute visit.  Diagnoses and all orders for this visit:  Chronic pain Comments: . Orders: -      oxyCODONE-acetaminophen (PERCOCET) 10-325 MG tablet; Take 1 tablet by mouth 3 (three) times daily as needed for pain. -     oxyCODONE-acetaminophen (PERCOCET) 10-325 MG tablet; Take 1 tablet by mouth 3 (three) times daily as needed for pain. -     oxyCODONE-acetaminophen (PERCOCET) 10-325 MG tablet; Take 1 tablet by mouth 3 (three) times daily as needed for pain.   . Reviewed expectations re: course of current medical issues. . Discussed self-management of symptoms. . Outlined signs and symptoms indicating need for more acute intervention. . Patient verbalized understanding and all questions were answered. [name] Health Maintenance issues including appropriate healthy diet, exercise, and smoking avoidance were discussed with patient. . See orders for this visit as documented in the electronic medical record. . Patient received an After Visit Summary.  CMA served as Neurosurgeon during this visit. History, Physical, and Plan performed by medical provider. The above documentation has been reviewed and is accurate and complete. Helane Rima, D.O.  Helane Rima, DO [place], [facility] 07/22/2017

## 2017-08-28 ENCOUNTER — Encounter: Payer: Self-pay | Admitting: Family Medicine

## 2017-10-28 ENCOUNTER — Encounter: Payer: Self-pay | Admitting: Family Medicine

## 2017-10-28 ENCOUNTER — Ambulatory Visit: Payer: BLUE CROSS/BLUE SHIELD | Admitting: Family Medicine

## 2017-10-28 VITALS — BP 124/82 | HR 81 | Temp 98.3°F | Ht 65.0 in | Wt 185.2 lb

## 2017-10-28 DIAGNOSIS — M25512 Pain in left shoulder: Secondary | ICD-10-CM

## 2017-10-28 DIAGNOSIS — M25562 Pain in left knee: Secondary | ICD-10-CM

## 2017-10-28 DIAGNOSIS — F341 Dysthymic disorder: Secondary | ICD-10-CM

## 2017-10-28 DIAGNOSIS — G8929 Other chronic pain: Secondary | ICD-10-CM | POA: Diagnosis not present

## 2017-10-28 MED ORDER — OXYCODONE-ACETAMINOPHEN 10-325 MG PO TABS: 1.0000 | ORAL_TABLET | Freq: Three times a day (TID) | ORAL | 0 refills | Status: DC | PRN

## 2017-10-28 MED ORDER — DICLOFENAC SODIUM 2 % TD SOLN: TRANSDERMAL | 0 refills | Status: DC

## 2017-10-28 MED ORDER — SERTRALINE HCL 100 MG PO TABS: 200.0000 mg | ORAL_TABLET | Freq: Every day | ORAL | 3 refills | Status: DC
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
## 2017-11-06 ENCOUNTER — Encounter: Payer: Self-pay | Admitting: Family Medicine

## 2017-11-07 ENCOUNTER — Other Ambulatory Visit: Payer: Self-pay | Admitting: Surgical

## 2017-11-07 MED ORDER — OSELTAMIVIR PHOSPHATE 75 MG PO CAPS: 75.0000 mg | ORAL_CAPSULE | Freq: Two times a day (BID) | ORAL | 0 refills | Status: DC

## 2017-11-09 ENCOUNTER — Other Ambulatory Visit: Payer: BLUE CROSS/BLUE SHIELD

## 2017-11-18 ENCOUNTER — Ambulatory Visit
Admission: RE | Admit: 2017-11-18 | Discharge: 2017-11-18 | Disposition: A | Discharge status: Home / Self Care | Payer: BLUE CROSS/BLUE SHIELD | Source: Ambulatory Visit | Attending: Family Medicine | Admitting: Family Medicine

## 2017-11-18 DIAGNOSIS — M25512 Pain in left shoulder: Secondary | ICD-10-CM

## 2017-11-18 DIAGNOSIS — M25562 Pain in left knee: Secondary | ICD-10-CM

## 2017-11-21 ENCOUNTER — Other Ambulatory Visit: Payer: Self-pay

## 2017-11-21 DIAGNOSIS — M25559 Pain in unspecified hip: Principal | ICD-10-CM

## 2017-11-21 DIAGNOSIS — G8929 Other chronic pain: Secondary | ICD-10-CM

## 2018-01-06 DIAGNOSIS — O0993 Supervision of high risk pregnancy, unspecified, third trimester: Secondary | ICD-10-CM | POA: Insufficient documentation

## 2018-01-06 LAB — OB RESULTS CONSOLE HIV ANTIBODY (ROUTINE TESTING): HIV: NONREACTIVE

## 2018-01-06 LAB — OB RESULTS CONSOLE RPR: RPR: NONREACTIVE

## 2018-01-06 LAB — OB RESULTS CONSOLE RUBELLA ANTIBODY, IGM: Rubella: IMMUNE

## 2018-01-06 LAB — OB RESULTS CONSOLE VARICELLA ZOSTER ANTIBODY, IGG: Varicella: IMMUNE

## 2018-01-06 LAB — OB RESULTS CONSOLE HEPATITIS B SURFACE ANTIGEN: Hepatitis B Surface Ag: NEGATIVE

## 2018-01-08 ENCOUNTER — Other Ambulatory Visit: Payer: Self-pay | Admitting: Obstetrics & Gynecology

## 2018-01-08 DIAGNOSIS — Z369 Encounter for antenatal screening, unspecified: Secondary | ICD-10-CM

## 2018-01-20 NOTE — Progress Notes (Addendum)
Rachel [name] is a 37 y.o. female is here for follow up.  History of Present Illness:   HPI: Rachel [name] presents for follow-up today.  She came about 2 weeks earlier than her previously scheduled visit to let me know that she is newly pregnant.  She is around 349 weeks gestational age.  Though she did not plan on the pregnancy, she is coming around to being more excited about it.  She has lots of questions today regarding her medication regimen while pregnant and at the time of delivery.   Her current regimen includes Percocet 10/325 mg po TID, Zoloft 100 mg po daily.   There are no preventive care reminders to display for this patient.   [facility] 2[facility] 01/21/2018 07/31/2016 01/24/2016  Decreased Interest 0 0 0  Down, Depressed, Hopeless 0 0 0  PHQ - 2 Score 0 0 0  Altered sleeping 0 - -  Tired, decreased energy 0 - -  Change in appetite 0 - -  Feeling bad or failure about yourself  0 - -  Trouble concentrating 0 - -  Moving slowly or fidgety/restless 0 - -  Suicidal thoughts 0 - -  PHQ-9 Score 0 - -  Difficult doing work/chores Not difficult at all - -   PMHx, SurgHx, SocialHx, FamHx, Medications, and Allergies were reviewed in the Visit Navigator and updated as appropriate.   Patient Active Problem List   Diagnosis Date Noted  . Chronic pain 07/31/2016  . Long term current use of opiate analgesic 07/31/2016  . Long term prescription opiate use 07/31/2016  . Opiate use 07/31/2016  . 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 Chronic shoulder pain (Location of Secondary source of pain) (Left) 07/31/2016  . Chronic lower extremity pain (Location of Tertiary source of pain) (Bilateral) (R>L) 07/31/2016  . Fever blister 02/12/2016  . Chronic low back pain (Location of Primary Source of Pain) (Bilateral) (R>L) 12/16/2015  . Dysthymia 03/06/2015   Social History   Tobacco Use  . Smoking status: Never Smoker  . Smokeless tobacco: Never Used  Substance Use  Topics  . Alcohol use: No  . Drug use: No   Current Medications and Allergies:   .  oxyCODONE-acetaminophen (PERCOCET) 10-325 MG tablet, Take 1 tablet by mouth 3 (three) times daily as needed for pain., Disp: 90 tablet,  .  oxyCODONE-acetaminophen (PERCOCET) 10-325 MG tablet, Take 1 tablet by mouth 3 (three) times daily as needed for pain., Disp: 90 tablet,  .  oxyCODONE-acetaminophen (PERCOCET) 10-325 MG tablet, Take 1 tablet by mouth 3 (three) times daily as needed for pain., Disp: 90 tablet,  .  sertraline (ZOLOFT) 100 MG tablet, Take 2 tablets (200 mg total) by mouth daily., Disp: 180 tablet, Rfl: 3   Allergies  Allergen Reactions  . Penicillins Anaphylaxis  . Sulfa Antibiotics Rash   Review of Systems   Pertinent items are noted in the HPI. Otherwise, ROS is negative.  Vitals:   Vitals:   01/21/18 1122  BP: 116/72  Pulse: 90  Temp: 98.3 F (36.8 C)  TempSrc: Oral  SpO2: 98%  Weight: 185 lb 3.2 oz (84 kg)  Height: 5\' 5"  (1.651 m)     Body mass index is 30.82 kg/m.   Physical Exam:   Physical Exam  Constitutional: She appears well-nourished.  HENT:  Head: Normocephalic and atraumatic.  Eyes: Pupils are equal, round, and reactive to light. EOM are normal.  Neck: Normal range of motion. Neck supple.  Cardiovascular: Normal rate, regular rhythm, normal heart sounds and intact distal pulses.  Pulmonary/Chest: Effort normal.  Abdominal: Soft.  Skin: Skin is warm.  Psychiatric: She has a normal mood and affect. Her behavior is normal.  Nursing note and vitals reviewed.   Assessment and Plan:   Diagnoses and all orders for this visit:  Chronic pain Comments: Reviewed risks of opiods during pregnancy and closer to delivery.  Patient is on board with weaning her oxycodone.  Our plan is to continue Tylenol 1000 mg 3 times daily.  Each week, the patient will decrease one dose of oxycodone by half with the intent to wean off.  Orders: -     Oxycodone HCl 10 MG  TABS; Take 1 tablet (10 mg total) by mouth 3 (three) times daily. For chronic pain. -     Oxycodone HCl 10 MG TABS; Take 1 tablet (10 mg total) by mouth 3 (three) times daily. For chronic pain -     acetaminophen (TYLENOL) 500 MG tablet; Take 2 tablets (1,000 mg total) by mouth 3 (three) times daily.  Dysthymia Comments: Continue current regimen.   Less than [redacted] weeks gestation of pregnancy Comments: Now established with OBGYN, Dr. Elesa Massed.   . Reviewed expectations re: course of current medical issues. . Discussed self-management of symptoms. . Outlined signs and symptoms indicating need for more acute intervention. . Patient verbalized understanding and all questions were answered. [name] Health Maintenance issues including appropriate healthy diet, exercise, and smoking avoidance were discussed with patient. . See orders for this visit as documented in the electronic medical record. . Patient received an After Visit Summary.  Helane Rima, DO [place], [facility] [place] 01/21/2018  Future Appointments  Date Time Provider Department Center  01/27/2018 10:00 AM [facility] None  01/27/2018 11:00 AM [facility] [place] [facility]

## 2018-01-21 ENCOUNTER — Encounter: Payer: Self-pay | Admitting: Family Medicine

## 2018-01-21 ENCOUNTER — Ambulatory Visit (INDEPENDENT_AMBULATORY_CARE_PROVIDER_SITE_OTHER): Payer: Self-pay | Admitting: Family Medicine

## 2018-01-21 VITALS — BP 116/72 | HR 90 | Temp 98.3°F | Ht 65.0 in | Wt 185.2 lb

## 2018-01-21 DIAGNOSIS — G8929 Other chronic pain: Secondary | ICD-10-CM

## 2018-01-21 DIAGNOSIS — Z3A01 Less than 8 weeks gestation of pregnancy: Secondary | ICD-10-CM

## 2018-01-21 DIAGNOSIS — F341 Dysthymic disorder: Secondary | ICD-10-CM

## 2018-01-21 MED ORDER — OXYCODONE HCL 10 MG PO TABS: 10.0000 mg | ORAL_TABLET | Freq: Three times a day (TID) | ORAL | 0 refills | Status: AC

## 2018-01-21 MED ORDER — OXYCODONE HCL 10 MG PO TABS: 10.0000 mg | ORAL_TABLET | Freq: Three times a day (TID) | ORAL | 0 refills | Status: DC

## 2018-01-21 MED ORDER — ACETAMINOPHEN 500 MG PO TABS: 1000.0000 mg | ORAL_TABLET | Freq: Three times a day (TID) | ORAL | 4 refills | Status: DC

## 2018-01-27 ENCOUNTER — Ambulatory Visit: Payer: Self-pay | Attending: Obstetrics & Gynecology

## 2018-01-27 ENCOUNTER — Ambulatory Visit: Payer: Self-pay

## 2018-01-29 ENCOUNTER — Ambulatory Visit: Payer: BLUE CROSS/BLUE SHIELD | Admitting: Family Medicine

## 2018-02-06 DIAGNOSIS — O09299 Supervision of pregnancy with other poor reproductive or obstetric history, unspecified trimester: Secondary | ICD-10-CM | POA: Insufficient documentation

## 2018-03-20 NOTE — Progress Notes (Signed)
Rachel [name] is a 37 y.o. female is here for follow up.  History of Present Illness:   HPI: Pregnancy is going well. Will be having a boy. Current name will likely be Rachel [name]. Repeat section scheduled for October 25. Down to Oxycodone 15-20 mg daily. Struggling to get lower. Goal is to decrease as much as possible before month 9. No concerns otherwise.   There are no preventive care reminders to display for this patient. Depression screen [facility] 2/9 03/21/2018 01/21/2018 07/31/2016  Decreased Interest 0 0 0  Down, Depressed, Hopeless 0 0 0  PHQ - 2 Score 0 0 0  Altered sleeping 0 0 -  Tired, decreased energy 0 0 -  Change in appetite 0 0 -  Feeling bad or failure about yourself  0 0 -  Trouble concentrating 0 0 -  Moving slowly or fidgety/restless 0 0 -  Suicidal thoughts 0 0 -  PHQ-9 Score 0 0 -  Difficult doing work/chores Not difficult at all Not difficult at all -   PMHx, SurgHx, SocialHx, FamHx, Medications, and Allergies were reviewed in the Visit Navigator and updated as appropriate.   Patient Active Problem List   Diagnosis Date Noted  . Long term current use of opiate analgesic 07/31/2016  . 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 Chronic shoulder pain (Location of Secondary source of pain) (Left) 07/31/2016  . Chronic lower extremity pain (Location of Tertiary source of pain) (Bilateral) (R>L) 07/31/2016  . Chronic low back pain (Location of Primary Source of Pain) (Bilateral) (R>L) 12/16/2015  . Supervision of normal pregnancy 08/25/2015  . Dysthymia 03/06/2015   Social History   Tobacco Use  . Smoking status: Never Smoker  . Smokeless tobacco: Never Used  Substance Use Topics  . Alcohol use: No  . Drug use: No   Current Medications and Allergies:   Current Outpatient Medications:  .  acetaminophen (TYLENOL) 500 MG tablet, Take 2 tablets (1,000 mg total) by mouth 3 (three) times daily., Disp: 90 tablet, Rfl: 4 .  Oxycodone HCl 10 MG TABS,  Take 1 tablet (10 mg total) by mouth 2 (two) times daily. For chronic pain, Disp: 180 tablet, Rfl: 0 .  Prenat w/o A-FE-Methfol-FA-DHA (PNV-DHA) 27-0.6-0.4-300 MG CAPS, Take 1 tablet by mouth daily., Disp: , Rfl:  .  sertraline (ZOLOFT) 100 MG tablet, Take 2 tablets (200 mg total) by mouth daily., Disp: 180 tablet, Rfl: 3   Allergies  Allergen Reactions  . Penicillins Anaphylaxis  . Sulfa Antibiotics Rash   Review of Systems   Pertinent items are noted in the HPI. Otherwise, ROS is negative.  Vitals:   Vitals:   03/21/18 1150  BP: 108/66  Pulse: (!) 106  Temp: 97.8 F (36.6 C)  TempSrc: Oral  SpO2: 98%  Weight: 187 lb 12.8 oz (85.2 kg)  Height: 5\' 5"  (1.651 m)     Body mass index is 31.25 kg/m.  Physical Exam:   Physical Exam  Constitutional: She appears well-nourished.  HENT:  Head: Normocephalic and atraumatic.  Eyes: Pupils are equal, round, and reactive to light. EOM are normal.  Neck: Normal range of motion. Neck supple.  Cardiovascular: Normal rate, regular rhythm, normal heart sounds and intact distal pulses.  Pulmonary/Chest: Effort normal.  Abdominal: Soft.  Skin: Skin is warm.  Psychiatric: She has a normal mood and affect. Her behavior is normal.  Nursing note and vitals reviewed.  Assessment and Plan:   [name] was seen today for follow-up.  Diagnoses and all orders for this visit:  Chronic pain Comments: Reviewed risks of opiods during pregnancy and closer to delivery.  Orders: -     Oxycodone HCl 10 MG TABS; Take 1 tablet (10 mg total) by mouth 2 (two) times daily. For chronic pain    . Reviewed expectations re: course of current medical issues. . Discussed self-management of symptoms. . Outlined signs and symptoms indicating need for more acute intervention. . Patient verbalized understanding and all questions were answered. [name]. Health Maintenance issues including appropriate healthy diet, exercise, and smoking avoidance were discussed with  patient. . See orders for this visit as documented in the electronic medical record. . Patient received an After Visit Summary.  Helane RimaErica Kaylor Simenson, DO [place], [facility] [place] 03/21/2018  Future Appointments  Date Time Provider Department Center  06/23/2018 11:00 AM Helane RimaWallace, Loura Pitt, DO [facility]

## 2018-03-21 ENCOUNTER — Ambulatory Visit (INDEPENDENT_AMBULATORY_CARE_PROVIDER_SITE_OTHER): Payer: Self-pay | Admitting: Family Medicine

## 2018-03-21 ENCOUNTER — Encounter: Payer: Self-pay | Admitting: Family Medicine

## 2018-03-21 DIAGNOSIS — G8929 Other chronic pain: Secondary | ICD-10-CM

## 2018-03-21 MED ORDER — OXYCODONE HCL 10 MG PO TABS: 10.0000 mg | ORAL_TABLET | Freq: Two times a day (BID) | ORAL | 0 refills | Status: DC

## 2018-06-03 ENCOUNTER — Other Ambulatory Visit: Payer: Self-pay

## 2018-06-03 ENCOUNTER — Inpatient Hospital Stay
Admission: EM | Admit: 2018-06-03 | Discharge: 2018-06-09 | DRG: 783 | Disposition: A | Discharge status: Home / Self Care | Payer: Medicaid Other | Attending: Obstetrics & Gynecology | Admitting: Obstetrics & Gynecology

## 2018-06-03 DIAGNOSIS — O42913 Preterm premature rupture of membranes, unspecified as to length of time between rupture and onset of labor, third trimester: Secondary | ICD-10-CM | POA: Diagnosis present

## 2018-06-03 DIAGNOSIS — Z302 Encounter for sterilization: Secondary | ICD-10-CM | POA: Diagnosis not present

## 2018-06-03 DIAGNOSIS — O9081 Anemia of the puerperium: Secondary | ICD-10-CM | POA: Diagnosis not present

## 2018-06-03 DIAGNOSIS — O99214 Obesity complicating childbirth: Secondary | ICD-10-CM | POA: Diagnosis present

## 2018-06-03 DIAGNOSIS — F329 Major depressive disorder, single episode, unspecified: Secondary | ICD-10-CM | POA: Diagnosis present

## 2018-06-03 DIAGNOSIS — O4290 Premature rupture of membranes, unspecified as to length of time between rupture and onset of labor, unspecified weeks of gestation: Secondary | ICD-10-CM

## 2018-06-03 DIAGNOSIS — Z3A30 30 weeks gestation of pregnancy: Secondary | ICD-10-CM

## 2018-06-03 DIAGNOSIS — Z79891 Long term (current) use of opiate analgesic: Secondary | ICD-10-CM | POA: Diagnosis not present

## 2018-06-03 DIAGNOSIS — O411230 Chorioamnionitis, third trimester, not applicable or unspecified: Secondary | ICD-10-CM | POA: Diagnosis present

## 2018-06-03 DIAGNOSIS — E669 Obesity, unspecified: Secondary | ICD-10-CM | POA: Diagnosis present

## 2018-06-03 DIAGNOSIS — Z880 Allergy status to penicillin: Secondary | ICD-10-CM | POA: Diagnosis not present

## 2018-06-03 DIAGNOSIS — O321XX0 Maternal care for breech presentation, not applicable or unspecified: Secondary | ICD-10-CM | POA: Diagnosis present

## 2018-06-03 DIAGNOSIS — O34211 Maternal care for low transverse scar from previous cesarean delivery: Secondary | ICD-10-CM | POA: Diagnosis present

## 2018-06-03 DIAGNOSIS — O99344 Other mental disorders complicating childbirth: Secondary | ICD-10-CM | POA: Diagnosis present

## 2018-06-03 DIAGNOSIS — D62 Acute posthemorrhagic anemia: Secondary | ICD-10-CM | POA: Diagnosis not present

## 2018-06-03 LAB — COMPREHENSIVE METABOLIC PANEL
ALT: 7 U/L (ref 0–44)
ANION GAP: 7 (ref 5–15)
AST: 10 U/L — ABNORMAL LOW (ref 15–41)
Albumin: 2.9 g/dL — ABNORMAL LOW (ref 3.5–5.0)
Alkaline Phosphatase: 70 U/L (ref 38–126)
BILIRUBIN TOTAL: 0.5 mg/dL (ref 0.3–1.2)
CO2: 23 mmol/L (ref 22–32)
Calcium: 8.5 mg/dL — ABNORMAL LOW (ref 8.9–10.3)
Chloride: 107 mmol/L (ref 98–111)
Creatinine, Ser: 0.32 mg/dL — ABNORMAL LOW (ref 0.44–1.00)
Glucose, Bld: 98 mg/dL (ref 70–99)
Potassium: 3.7 mmol/L (ref 3.5–5.1)
Sodium: 137 mmol/L (ref 135–145)
TOTAL PROTEIN: 6.2 g/dL — AB (ref 6.5–8.1)

## 2018-06-03 LAB — CBC
HEMATOCRIT: 33.8 % — AB (ref 35.0–47.0)
Hemoglobin: 11.1 g/dL — ABNORMAL LOW (ref 12.0–16.0)
MCH: 26.8 pg (ref 26.0–34.0)
MCHC: 33 g/dL (ref 32.0–36.0)
MCV: 81.3 fL (ref 80.0–100.0)
Platelets: 184 10*3/uL (ref 150–440)
RBC: 4.15 MIL/uL (ref 3.80–5.20)
RDW: 13.8 % (ref 11.5–14.5)
WBC: 8.1 10*3/uL (ref 3.6–11.0)

## 2018-06-03 LAB — OB RESULTS CONSOLE GBS: GBS: NEGATIVE

## 2018-06-03 LAB — URINALYSIS, COMPLETE (UACMP) WITH MICROSCOPIC
BILIRUBIN URINE: NEGATIVE
Bacteria, UA: NONE SEEN
HGB URINE DIPSTICK: NEGATIVE
KETONES UR: NEGATIVE mg/dL
LEUKOCYTES UA: NEGATIVE
Nitrite: NEGATIVE
PH: 7 (ref 5.0–8.0)
Protein, ur: NEGATIVE mg/dL
Specific Gravity, Urine: 1.015 (ref 1.005–1.030)

## 2018-06-03 LAB — OB RESULTS CONSOLE GC/CHLAMYDIA
Chlamydia: NEGATIVE
Gonorrhea: NEGATIVE

## 2018-06-03 LAB — TYPE AND SCREEN
ABO/RH(D): A POS
Antibody Screen: NEGATIVE

## 2018-06-03 MED ORDER — ZOLPIDEM TARTRATE 5 MG PO TABS
5.0000 mg | ORAL_TABLET | Freq: Every evening | ORAL | Status: DC | PRN
  Administered 2018-06-03: 5 mg via ORAL
  Filled 2018-06-03: qty 1

## 2018-06-03 MED ORDER — DEXTROSE 5 % IV SOLN
1000.0000 mg | Freq: Once | INTRAVENOUS | Status: AC
  Administered 2018-06-03: 1000 mg via INTRAVENOUS
  Filled 2018-06-03: qty 500

## 2018-06-03 MED ORDER — DIPHENHYDRAMINE HCL 50 MG/ML IJ SOLN
50.0000 mg | Freq: Three times a day (TID) | INTRAMUSCULAR | Status: DC | PRN
  Administered 2018-06-04: 50 mg via INTRAVENOUS
  Filled 2018-06-03: qty 1

## 2018-06-03 MED ORDER — BETAMETHASONE SOD PHOS & ACET 6 (3-3) MG/ML IJ SUSP
INTRAMUSCULAR | Status: AC
  Filled 2018-06-03: qty 5

## 2018-06-03 MED ORDER — PRENATAL MULTIVITAMIN CH
1.0000 | ORAL_TABLET | Freq: Every day | ORAL | Status: DC
  Administered 2018-06-04 – 2018-06-05 (×2): 1 via ORAL
  Filled 2018-06-03 (×2): qty 1

## 2018-06-03 MED ORDER — SERTRALINE HCL 25 MG PO TABS
150.0000 mg | ORAL_TABLET | Freq: Every day | ORAL | Status: DC
  Administered 2018-06-03 – 2018-06-04 (×2): 150 mg via ORAL
  Filled 2018-06-03 (×3): qty 1

## 2018-06-03 MED ORDER — ONDANSETRON HCL 4 MG/2ML IJ SOLN
4.0000 mg | Freq: Four times a day (QID) | INTRAMUSCULAR | Status: DC | PRN
  Filled 2018-06-03: qty 2

## 2018-06-03 MED ORDER — BETAMETHASONE SOD PHOS & ACET 6 (3-3) MG/ML IJ SUSP
12.0000 mg | INTRAMUSCULAR | Status: AC
  Administered 2018-06-03 – 2018-06-04 (×2): 12 mg via INTRAMUSCULAR

## 2018-06-03 MED ORDER — SODIUM CHLORIDE FLUSH 0.9 % IV SOLN
INTRAVENOUS | Status: AC
  Administered 2018-06-03: 10 mL
  Filled 2018-06-03: qty 10

## 2018-06-03 MED ORDER — DOCUSATE SODIUM 100 MG PO CAPS
100.0000 mg | ORAL_CAPSULE | Freq: Every day | ORAL | Status: DC
  Administered 2018-06-04 – 2018-06-05 (×2): 100 mg via ORAL
  Filled 2018-06-03 (×2): qty 1

## 2018-06-03 MED ORDER — CALCIUM GLUCONATE 10 % IV SOLN
INTRAVENOUS | Status: AC
  Filled 2018-06-03: qty 10

## 2018-06-03 MED ORDER — MAGNESIUM SULFATE 40 G IN LACTATED RINGERS - SIMPLE
2.0000 g/h | INTRAVENOUS | Status: DC
  Administered 2018-06-03 – 2018-06-05 (×3): 2 g/h via INTRAVENOUS
  Filled 2018-06-03 (×3): qty 500

## 2018-06-03 MED ORDER — OXYCODONE-ACETAMINOPHEN 5-325 MG PO TABS
1.0000 | ORAL_TABLET | Freq: Every day | ORAL | Status: DC
  Administered 2018-06-03 – 2018-06-05 (×3): 1 via ORAL
  Filled 2018-06-03 (×3): qty 1

## 2018-06-03 MED ORDER — CALCIUM CARBONATE ANTACID 500 MG PO CHEW: 2.0000 | CHEWABLE_TABLET | ORAL | Status: DC | PRN

## 2018-06-03 MED ORDER — LACTATED RINGERS IV SOLN
INTRAVENOUS | Status: DC
  Administered 2018-06-03 – 2018-06-04 (×4): via INTRAVENOUS
  Administered 2018-06-05: 1000 mL via INTRAVENOUS

## 2018-06-03 MED ORDER — CEFAZOLIN SODIUM-DEXTROSE 1-4 GM/50ML-% IV SOLN
1.0000 g | Freq: Three times a day (TID) | INTRAVENOUS | Status: AC
  Administered 2018-06-03 – 2018-06-05 (×7): 1 g via INTRAVENOUS
  Filled 2018-06-03 (×7): qty 50

## 2018-06-03 MED ORDER — MAGNESIUM SULFATE BOLUS VIA INFUSION
4.0000 g | Freq: Once | INTRAVENOUS | Status: AC
  Administered 2018-06-03: 4 g via INTRAVENOUS
  Filled 2018-06-03: qty 500

## 2018-06-03 MED ORDER — ACETAMINOPHEN 325 MG PO TABS
650.0000 mg | ORAL_TABLET | ORAL | Status: DC | PRN
  Administered 2018-06-04 – 2018-06-05 (×5): 650 mg via ORAL
  Filled 2018-06-03 (×5): qty 2

## 2018-06-03 NOTE — Progress Notes (Signed)
Patient ID: Rachel [name], female   DOB: 10/25/1980, 37 y.o.   MRN: [id number] Variable decels noted. Overall reassuring fetal monitoring

## 2018-06-03 NOTE — H&P (Signed)
OB History & Physical   History of Present Illness:  Chief Complaint:   HPI:  Rachel [name] is a 37 y.o. 653P2002 female at 4035w4d dated by7+0 week u/s  C/o LOF starting at 1130 today. + ROM by Dr Elesa MassedWard in clinic . Cultures done . PCN allergy , but has taken Keflex after PCN allergy started .  She is s/p a LTCS in past .   Maternal Medical History:   Past Medical History:  Diagnosis Date  . Allergy   . Anxiety   . Depression   . Fever blister 02/12/2016  . Migraines     Past Surgical History:  Procedure Laterality Date  . CESAREAN SECTION N/A 08/26/2015   Procedure: CESAREAN SECTION;  Surgeon: Hildred LaserAnika Cherry, MD;  Location: [facility] ORS;  Service: Obstetrics;  Laterality: N/A;  . GANGLION CYST EXCISION Right wrist  . GANGLION CYST EXCISION     right hand 07/2014    Allergies  Allergen Reactions  . Penicillins Anaphylaxis  . Sulfa Antibiotics Rash    Prior to Admission medications   Medication Sig Start Date End Date Taking? Authorizing Provider  acetaminophen (TYLENOL) 500 MG tablet Take 2 tablets (1,000 mg total) by mouth 3 (three) times daily. 01/21/18  Yes Helane RimaWallace, Erica, DO  Prenat w/o A-FE-Methfol-FA-DHA (PNV-DHA) 27-0.6-0.4-300 MG CAPS Take 1 tablet by mouth daily.   Yes [provider]  sertraline (ZOLOFT) 100 MG tablet Take 2 tablets (200 mg total) by mouth daily. 10/28/17  Yes Helane RimaWallace, Erica, DO  Oxycodone HCl 10 MG TABS Take 1 tablet (10 mg total) by mouth 2 (two) times daily. For chronic pain Patient not taking: Reported on 06/03/2018 03/21/18 06/19/18  Helane RimaWallace, Erica, DO     [facility]   Social History: She  reports that she has never smoked. She has never used smokeless tobacco. She reports that she does not drink alcohol or use drugs.  Family History: family history includes Heart disease in her father and mother.   Review of Systems: Review of Systems: A full review of systems was performed and negative except as  noted in the HPI.   Eyes: no vision change  Ears: left ear pain  Oropharynx: no sore throat  Pulmonary . No shortness of breath , no hemoptysis Cardiovascular: no chest pain , no irregular heart beat  Gastrointestinal:no blood in stool . No diarrhea, no constipation Uro gynecologic: no dysuria , no pelvic pain Neurologic : no seizure , no migraines    Musculoskeletal: no muscular weakness   Physical Exam:  Vital Signs: BP 109/72 (BP Location: Left Arm)   Pulse (!) 106   Temp 98.2 F (36.8 C) (Oral)   Resp 17   Ht 5\' 5"  (1.651 m)   Wt 85.7 kg   BMI 31.45 kg/m  General: no acute distress.  HEENT: normocephalic, atraumatic Heart: regular rate & rhythm.  No murmurs/rubs/gallops Lungs: clear to auscultation bilaterally, normal respiratory effort Abdomen: soft, gravid, non-tender;   Pelvic:   External: Normal external female genitalia  Cervix:   closed by Dr Elesa MassedWard in clinic  /      Extremities: non-tender, symmetric, Neurologic: Alert & oriented x 3.     Pertinent Results:  Prenatal Labs: Blood type/Rh A+  Antibody screen neg  Rubella Immune  Varicella Immune  RPR NR  HBsAg Neg  HIV NR  GC neg  Chlamydia neg  Genetic screening negative  1 hour GTT   3 hour GTT   GBS  FHT:130, reassuring  TOCO:no CTX  SVE:    /   /   closed in office Dr Ward   Cephalic by leopolds  No results found.  Assessment:  Rachel [name] is a 37 y.o. G40P2002 female at [redacted]w[redacted]d with PPROM at 30+4 weeks  Reassuring fetus currently . At risk for PTD , placenta abruption , stillbirth , chorioamnionitis, cord compression   Plan:  1. Admit to Labor & Delivery 2. CBC, T&S, Clrs, IVF 3. GBS  Pending , start prophylaxis. Add Azithromycin to cover for Ureaplasm + chlamydia . Magnesium as a tocolysis for 48 hours to buy time to get Dexamethasone 12 mg IVF repeat in 24 hours 4. ABX prophylaxis with Ancef 1 gm q 8 hrs IVF ( 48 hours). Pt stated that she has taken Keflex  without problems   5. Continuous efm/toco 6.  I have explained the risk to the pt and her Husband , 7.  8. Goal is to get the patient to 34+0 week and then can move torwards delivery  Or repeat LTCS   ----- Jennell Corner MD Attending Obstetrician and Gynecologist [facility], Department of OB/GYN [facility]

## 2018-06-04 ENCOUNTER — Inpatient Hospital Stay: Payer: Medicaid Other

## 2018-06-04 LAB — MAGNESIUM: MAGNESIUM: 4.9 mg/dL — AB (ref 1.7–2.4)

## 2018-06-04 MED ORDER — SODIUM CHLORIDE FLUSH 0.9 % IV SOLN
INTRAVENOUS | Status: AC
  Administered 2018-06-04: 08:00:00
  Filled 2018-06-04: qty 10

## 2018-06-04 MED ORDER — OXYCODONE-ACETAMINOPHEN 5-325 MG PO TABS
1.0000 | ORAL_TABLET | Freq: Once | ORAL | Status: AC
  Administered 2018-06-04: 1 via ORAL
  Filled 2018-06-04: qty 1

## 2018-06-04 MED ORDER — DIAZEPAM 5 MG PO TABS
10.0000 mg | ORAL_TABLET | Freq: Every evening | ORAL | Status: DC | PRN
  Administered 2018-06-04 – 2018-06-05 (×2): 10 mg via ORAL
  Filled 2018-06-04 (×4): qty 2

## 2018-06-04 MED ORDER — DIAZEPAM 5 MG PO TABS
10.0000 mg | ORAL_TABLET | Freq: Once | ORAL | Status: AC
  Administered 2018-06-04: 10 mg via ORAL
  Filled 2018-06-04: qty 2

## 2018-06-04 NOTE — Plan of Care (Signed)
Reviewed plan of care with patient. Patient verbalized understanding.

## 2018-06-04 NOTE — Progress Notes (Signed)
ANTEPARTUM PROGRESS NOTE  Rachel [name] is a 37 y.o. [id number] at 56w5dwho is admitted for PPROM.   Estimated Date of Delivery: 08/08/18  Length of Stay:  1 Days. Admitted 06/03/2018  Subjective: Feeling okay. Eager to see baby on ultrasound. Reports minimal continued leakage of fluid.   She reports:  -active fetal movement -no vaginal bleeding -no contractions  Vitals:  BP 100/72   Pulse (!) 122   Temp 97.8 F (36.6 C) (Oral)   Resp 18   Ht _0  (1.651 m)   Wt 85.7 kg   SpO2 100%   BMI 31.45 kg/m  Physical Examination: General:   alert, cooperative, appears stated age and no distress  Skin:  normal and no rash or abnormalities  Neurologic:    Alert & oriented x 3  Lungs:   clear to auscultation bilaterally  Heart:   regular rate and rhythm, S1, S2 normal, no murmur, click, rub or gallop  Abdomen:  soft, non-tender; bowel sounds normal; no masses,  no organomegaly  Pelvis:  Exam deferred.  FHT:  125 BPM  Presentations: unsure  Cervix:  Deferred  Extremities: : non-tender, symmetric, no edema bilaterally.  DTRs: +2    Fetal monitoring: FHR: 125 bpm, Variability: moderate, Accelerations: 15x15s and 10x10s present, Decelerations: occasional variable Uterine activity: toco quiet  Results for orders placed or performed during the hospital encounter of 06/03/18 (from the past 48 hour(s))  CBC on admission     Status: Abnormal   Collection Time: 06/03/18  3:19 PM  Result Value Ref Range   WBC 8.1 3.6 - 11.0 K/uL   RBC 4.15 3.80 - 5.20 MIL/uL   Hemoglobin 11.1 (L) 12.0 - 16.0 g/dL   HCT 33.8 (L) 35.0 - 47.0 %   MCV 81.3 80.0 - 100.0 fL   MCH 26.8 26.0 - 34.0 pg   MCHC 33.0 32.0 - 36.0 g/dL   RDW 13.8 11.5 - 14.5 %   Platelets 184 150 - 440 K/uL    Comment: [facility]formed at [facility] [address] [place] [address] Type and screen [place]    Status: None   Collection Time: 06/03/18  3:19 PM  Result Value Ref Range    ABO/RH(D) A POS    Antibody Screen NEG    Sample Expiration      06/06/2018 Performed at [facility] [address] [place] [address]  Comprehensive metabolic panel     Status: Abnormal   Collection Time: 06/03/18  3:19 PM  Result Value Ref Range   Sodium 137 135 - 145 mmol/L   Potassium 3.7 3.5 - 5.1 mmol/L   Chloride 107 98 - 111 mmol/L   CO2 23 22 - 32 mmol/L   Glucose, Bld 98 70 - 99 mg/dL   BUN <5 (L) 6 - 20 mg/dL   Creatinine, Ser 0.32 (L) 0.44 - 1.00 mg/dL   Calcium 8.5 (L) 8.9 - 10.3 mg/dL   Total Protein 6.2 (L) 6.5 - 8.1 g/dL   Albumin 2.9 (L) 3.5 - 5.0 g/dL   AST 10 (L) 15 - 41 U/L   ALT 7 0 - 44 U/L   Alkaline Phosphatase 70 38 - 126 U/L   Total Bilirubin 0.5 0.3 - 1.2 mg/dL   GFR calc non Af Amer >60 >60 mL/min   GFR calc Af Amer >60 >60 mL/min    Comment: (NOTE) The eGFR has been calculated using the CKD  EPI equation. This calculation has not been validated in all clinical situations. eGFR's persistently <60 mL/min signify possible Chronic Kidney Disease.    Anion gap 7 5 - 15    Comment: Performed at [facility], [address] [place], [place] [address]  Urinalysis, Complete w Microscopic     Status: Abnormal   Collection Time: 06/03/18  4:28 PM  Result Value Ref Range   Color, Urine YELLOW (A) YELLOW   APPearance CLEAR (A) CLEAR   Specific Gravity, Urine 1.015 1.005 - 1.030   pH 7.0 5.0 - 8.0   Glucose, UA >=500 (A) NEGATIVE mg/dL   Hgb urine dipstick NEGATIVE NEGATIVE   Bilirubin Urine NEGATIVE NEGATIVE   Ketones, ur NEGATIVE NEGATIVE mg/dL   Protein, ur NEGATIVE NEGATIVE mg/dL   Nitrite NEGATIVE NEGATIVE   Leukocytes, UA NEGATIVE NEGATIVE   RBC / HPF 0-5 0 - 5 RBC/hpf   WBC, UA 0-5 0 - 5 WBC/hpf   Bacteria, UA NONE SEEN NONE SEEN   Squamous Epithelial / LPF 0-5 0 - 5   Mucus PRESENT     Comment: Performed at [facility], [address] [place], [place] [address]  Magnesium     Status: Abnormal    Collection Time: 06/04/18  3:04 AM  Result Value Ref Range   Magnesium 4.9 (H) 1.7 - 2.4 mg/dL    Comment: Performed at [facility], [address], [place], [place] [address]   Current scheduled medications . betamethasone acetate-betamethasone sodium phosphate  12 mg Intramuscular Q24H  . docusate sodium  100 mg Oral Daily  . oxyCODONE-acetaminophen  1 tablet Oral QHS  . prenatal multivitamin  1 tablet Oral Q1200  . sertraline  150 mg Oral Daily  . sodium chloride flush        I have reviewed the patient's current medications.  ASSESSMENT: Patient Active Problem List   Diagnosis Date Noted  . Preterm premature rupture of membranes in third trimester 06/03/2018  . Long term current use of opiate analgesic 07/31/2016  . 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 Chronic shoulder pain (Location of Secondary source of pain) (Left) 07/31/2016  . Chronic lower extremity pain (Location of Tertiary source of pain) (Bilateral) (R>L) 07/31/2016  . Chronic low back pain (Location of Primary Source of Pain) (Bilateral) (R>L) 12/16/2015  . Supervision of normal pregnancy 08/25/2015  . Dysthymia 03/06/2015    PLAN:  PPROM:  Fetal well-being  s/p 1 dose of betamethasone for fetal lung maturation, 2nd dose to be given today at 1530  Occasional variable deceleration, but overall reassuring fetal monitoring   Continue continuous EFM while on magnesium sulfate  Growth ultrasound ordered for today to assess fetus and fluid status  Tocolysis  Magnesium sulfate for 48 hours to add in steroid administration. Started 06/03/18 at 1615.  Adequate urine output, reflexes WNL.   Prophylactic antibiotics  Azithromycin 1g given upon adimission  Cefazolin 1g q8h IV x 48 hours, first dose administered 06/03/18 at 1540  Cephalexin 553m QID x 5 days once IV antibiotics are completed  GBS pending  Maternal well-being  Ambien 551mPRN for sleep last night with undesirable  side effects. Discontinued.    Discussed patient status and plan of care with Dr. ScOuida Sillswho is in agreement with this plan.    MaLisette GrinderCNNorth [place]/28/2019 12:31 PM  MaLisette Grinderertified Nurse Midwife [facility]

## 2018-06-05 LAB — URINE CULTURE

## 2018-06-05 MED ORDER — SERTRALINE HCL 25 MG PO TABS
150.0000 mg | ORAL_TABLET | Freq: Every day | ORAL | Status: DC
  Administered 2018-06-06 – 2018-06-08 (×3): 150 mg via ORAL
  Filled 2018-06-05 (×4): qty 1

## 2018-06-05 MED ORDER — SODIUM CHLORIDE 0.9% FLUSH
10.0000 mL | Freq: Three times a day (TID) | INTRAVENOUS | Status: DC
  Administered 2018-06-05 (×3): 10 mL via INTRAVENOUS

## 2018-06-05 MED ORDER — SODIUM CHLORIDE FLUSH 0.9 % IV SOLN
INTRAVENOUS | Status: AC
  Administered 2018-06-05: 17:00:00
  Filled 2018-06-05: qty 10

## 2018-06-05 MED ORDER — CEPHALEXIN 500 MG PO CAPS
500.0000 mg | ORAL_CAPSULE | Freq: Four times a day (QID) | ORAL | Status: DC
  Administered 2018-06-05: 500 mg via ORAL
  Filled 2018-06-05 (×5): qty 1

## 2018-06-05 MED ORDER — SODIUM CHLORIDE FLUSH 0.9 % IV SOLN
INTRAVENOUS | Status: AC
  Administered 2018-06-05: 10 mL
  Filled 2018-06-05: qty 10

## 2018-06-05 MED ORDER — SODIUM CHLORIDE FLUSH 0.9 % IV SOLN
INTRAVENOUS | Status: AC
  Filled 2018-06-05: qty 10

## 2018-06-06 ENCOUNTER — Inpatient Hospital Stay: Payer: Medicaid Other | Admitting: Anesthesiology

## 2018-06-06 ENCOUNTER — Encounter
Admission: EM | Disposition: A | Discharge status: Home / Self Care | Payer: Self-pay | Source: Home / Self Care | Attending: Obstetrics & Gynecology

## 2018-06-06 LAB — CBC
HCT: 30.3 % — ABNORMAL LOW (ref 35.0–47.0)
HEMATOCRIT: 29 % — AB (ref 35.0–47.0)
Hemoglobin: 10.1 g/dL — ABNORMAL LOW (ref 12.0–16.0)
Hemoglobin: 9.6 g/dL — ABNORMAL LOW (ref 12.0–16.0)
MCH: 26.8 pg (ref 26.0–34.0)
MCH: 27 pg (ref 26.0–34.0)
MCHC: 33.2 g/dL (ref 32.0–36.0)
MCHC: 33.2 g/dL (ref 32.0–36.0)
MCV: 80.9 fL (ref 80.0–100.0)
MCV: 81.5 fL (ref 80.0–100.0)
PLATELETS: 185 10*3/uL (ref 150–440)
Platelets: 170 10*3/uL (ref 150–440)
RBC: 3.75 MIL/uL — AB (ref 3.80–5.20)
RBC: 3.56 MIL/uL — ABNORMAL LOW (ref 3.80–5.20)
RDW: 14.5 % (ref 11.5–14.5)
RDW: 14.3 % (ref 11.5–14.5)
WBC: 7.4 10*3/uL (ref 3.6–11.0)
WBC: 10.1 10*3/uL (ref 3.6–11.0)

## 2018-06-06 SURGERY — Surgical Case
Anesthesia: Spinal | Site: Abdomen | Wound class: Clean Contaminated

## 2018-06-06 MED ORDER — SOD CITRATE-CITRIC ACID 500-334 MG/5ML PO SOLN
ORAL | Status: AC
  Filled 2018-06-06: qty 15

## 2018-06-06 MED ORDER — PRENATAL MULTIVITAMIN CH
1.0000 | ORAL_TABLET | Freq: Every day | ORAL | Status: DC
  Administered 2018-06-06 – 2018-06-09 (×4): 1 via ORAL
  Filled 2018-06-06 (×5): qty 1

## 2018-06-06 MED ORDER — OXYCODONE HCL 5 MG PO TABS
10.0000 mg | ORAL_TABLET | ORAL | Status: DC | PRN
  Administered 2018-06-07 – 2018-06-08 (×5): 10 mg via ORAL
  Filled 2018-06-06 (×5): qty 2

## 2018-06-06 MED ORDER — ACETAMINOPHEN 500 MG PO TABS
1000.0000 mg | ORAL_TABLET | Freq: Four times a day (QID) | ORAL | Status: DC
  Administered 2018-06-06 – 2018-06-09 (×11): 1000 mg via ORAL
  Filled 2018-06-06 (×12): qty 2

## 2018-06-06 MED ORDER — DIPHENHYDRAMINE HCL 25 MG PO CAPS: 25.0000 mg | ORAL_CAPSULE | ORAL | Status: DC | PRN

## 2018-06-06 MED ORDER — SODIUM CHLORIDE 0.9% FLUSH: 3.0000 mL | Freq: Two times a day (BID) | INTRAVENOUS | Status: DC

## 2018-06-06 MED ORDER — OXYTOCIN 40 UNITS IN LACTATED RINGERS INFUSION - SIMPLE MED
INTRAVENOUS | Status: AC
  Filled 2018-06-06: qty 1000

## 2018-06-06 MED ORDER — SODIUM CHLORIDE 0.9% FLUSH: 3.0000 mL | INTRAVENOUS | Status: DC | PRN

## 2018-06-06 MED ORDER — ONDANSETRON HCL 4 MG/2ML IJ SOLN
4.0000 mg | Freq: Three times a day (TID) | INTRAMUSCULAR | Status: DC | PRN
  Administered 2018-06-06: 4 mg via INTRAVENOUS

## 2018-06-06 MED ORDER — BUPIVACAINE LIPOSOME 1.3 % IJ SUSP
INTRAMUSCULAR | Status: AC
  Filled 2018-06-06: qty 20

## 2018-06-06 MED ORDER — SODIUM CHLORIDE 0.9 % IV SOLN: 250.0000 mL | INTRAVENOUS | Status: DC

## 2018-06-06 MED ORDER — DIPHENHYDRAMINE HCL 25 MG PO CAPS: 25.0000 mg | ORAL_CAPSULE | Freq: Four times a day (QID) | ORAL | Status: DC | PRN

## 2018-06-06 MED ORDER — MORPHINE SULFATE (PF) 0.5 MG/ML IJ SOLN
INTRAMUSCULAR | Status: DC | PRN
  Administered 2018-06-06: .2 mg via INTRATHECAL

## 2018-06-06 MED ORDER — DIPHENHYDRAMINE HCL 50 MG/ML IJ SOLN: 12.5000 mg | INTRAMUSCULAR | Status: DC | PRN

## 2018-06-06 MED ORDER — FENTANYL CITRATE (PF) 100 MCG/2ML IJ SOLN
INTRAMUSCULAR | Status: AC
  Filled 2018-06-06: qty 2

## 2018-06-06 MED ORDER — SENNOSIDES-DOCUSATE SODIUM 8.6-50 MG PO TABS
2.0000 | ORAL_TABLET | ORAL | Status: DC
  Administered 2018-06-07 – 2018-06-09 (×3): 2 via ORAL
  Filled 2018-06-06 (×3): qty 2

## 2018-06-06 MED ORDER — WITCH HAZEL-GLYCERIN EX PADS: 1.0000 "application " | MEDICATED_PAD | CUTANEOUS | Status: DC | PRN

## 2018-06-06 MED ORDER — OXYTOCIN 40 UNITS IN LACTATED RINGERS INFUSION - SIMPLE MED: 2.5000 [IU]/h | INTRAVENOUS | Status: DC

## 2018-06-06 MED ORDER — BUPIVACAINE LIPOSOME 1.3 % IJ SUSP: 20.0000 mL | Freq: Once | INTRAMUSCULAR | Status: DC

## 2018-06-06 MED ORDER — MENTHOL 3 MG MT LOZG
1.0000 | LOZENGE | OROMUCOSAL | Status: DC | PRN
  Filled 2018-06-06: qty 9

## 2018-06-06 MED ORDER — NALOXONE HCL 0.4 MG/ML IJ SOLN: 0.4000 mg | INTRAMUSCULAR | Status: DC | PRN

## 2018-06-06 MED ORDER — MEPERIDINE HCL 50 MG/ML IJ SOLN: 6.2500 mg | INTRAMUSCULAR | Status: DC | PRN

## 2018-06-06 MED ORDER — OXYTOCIN 40 UNITS IN LACTATED RINGERS INFUSION - SIMPLE MED
INTRAVENOUS | Status: DC | PRN
  Administered 2018-06-06: 500 mL via INTRAVENOUS

## 2018-06-06 MED ORDER — COCONUT OIL OIL: 1.0000 "application " | TOPICAL_OIL | Status: DC | PRN

## 2018-06-06 MED ORDER — SOD CITRATE-CITRIC ACID 500-334 MG/5ML PO SOLN
30.0000 mL | ORAL | Status: AC
  Administered 2018-06-06: 30 mL via ORAL

## 2018-06-06 MED ORDER — OXYTOCIN 40 UNITS IN LACTATED RINGERS INFUSION - SIMPLE MED
2.5000 [IU]/h | INTRAVENOUS | Status: DC
  Administered 2018-06-06: 2.5 [IU]/h via INTRAVENOUS

## 2018-06-06 MED ORDER — BUPIVACAINE LIPOSOME 1.3 % IJ SUSP
INTRAMUSCULAR | Status: DC | PRN
  Administered 2018-06-06: 100 mL

## 2018-06-06 MED ORDER — BUPIVACAINE HCL (PF) 0.5 % IJ SOLN: 30.0000 mL | Freq: Once | INTRAMUSCULAR | Status: DC

## 2018-06-06 MED ORDER — OXYCODONE HCL 5 MG PO TABS
5.0000 mg | ORAL_TABLET | ORAL | Status: DC | PRN
  Administered 2018-06-06: 5 mg via ORAL

## 2018-06-06 MED ORDER — ACETAMINOPHEN 650 MG RE SUPP
650.0000 mg | Freq: Once | RECTAL | Status: DC
  Filled 2018-06-06 (×2): qty 1

## 2018-06-06 MED ORDER — NALBUPHINE HCL 10 MG/ML IJ SOLN: 5.0000 mg | INTRAMUSCULAR | Status: DC | PRN

## 2018-06-06 MED ORDER — SODIUM CHLORIDE 0.9 % IV BOLUS
500.0000 mL | Freq: Once | INTRAVENOUS | Status: AC
  Administered 2018-06-06: 500 mL via INTRAVENOUS

## 2018-06-06 MED ORDER — ACETAMINOPHEN 500 MG PO TABS: 1000.0000 mg | ORAL_TABLET | Freq: Four times a day (QID) | ORAL | Status: DC

## 2018-06-06 MED ORDER — KETOROLAC TROMETHAMINE 30 MG/ML IJ SOLN: 30.0000 mg | Freq: Four times a day (QID) | INTRAMUSCULAR | Status: DC | PRN

## 2018-06-06 MED ORDER — IBUPROFEN 600 MG PO TABS
600.0000 mg | ORAL_TABLET | Freq: Four times a day (QID) | ORAL | Status: DC
  Administered 2018-06-07 – 2018-06-09 (×9): 600 mg via ORAL
  Filled 2018-06-06 (×9): qty 1

## 2018-06-06 MED ORDER — IBUPROFEN 600 MG PO TABS
600.0000 mg | ORAL_TABLET | Freq: Four times a day (QID) | ORAL | Status: DC
  Administered 2018-06-06 (×2): 600 mg via ORAL
  Filled 2018-06-06 (×2): qty 1

## 2018-06-06 MED ORDER — LACTATED RINGERS IV SOLN: INTRAVENOUS | Status: DC

## 2018-06-06 MED ORDER — SIMETHICONE 80 MG PO CHEW: 160.0000 mg | CHEWABLE_TABLET | Freq: Four times a day (QID) | ORAL | Status: DC | PRN

## 2018-06-06 MED ORDER — BUPIVACAINE HCL (PF) 0.5 % IJ SOLN
INTRAMUSCULAR | Status: AC
  Filled 2018-06-06: qty 30

## 2018-06-06 MED ORDER — OXYCODONE HCL 5 MG PO TABS
5.0000 mg | ORAL_TABLET | ORAL | Status: DC | PRN
  Administered 2018-06-06 – 2018-06-08 (×3): 5 mg via ORAL
  Filled 2018-06-06 (×5): qty 1

## 2018-06-06 MED ORDER — MORPHINE SULFATE (PF) 0.5 MG/ML IJ SOLN
INTRAMUSCULAR | Status: AC
  Filled 2018-06-06: qty 10

## 2018-06-06 MED ORDER — KETOROLAC TROMETHAMINE 30 MG/ML IJ SOLN
INTRAMUSCULAR | Status: AC
  Administered 2018-06-06: 30 mg via INTRAVENOUS
  Filled 2018-06-06: qty 1

## 2018-06-06 MED ORDER — BUPIVACAINE IN DEXTROSE 0.75-8.25 % IT SOLN
INTRATHECAL | Status: DC | PRN
  Administered 2018-06-06: 1.6 mL via INTRATHECAL

## 2018-06-06 MED ORDER — DIBUCAINE 1 % RE OINT: 1.0000 "application " | TOPICAL_OINTMENT | RECTAL | Status: DC | PRN

## 2018-06-06 MED ORDER — SODIUM CHLORIDE 0.9 % IJ SOLN
INTRAMUSCULAR | Status: AC
  Filled 2018-06-06: qty 50

## 2018-06-06 MED ORDER — SODIUM CHLORIDE 0.9 % IV SOLN
500.0000 mg | INTRAVENOUS | Status: AC
  Administered 2018-06-06: 500 mg via INTRAVENOUS
  Filled 2018-06-06: qty 500

## 2018-06-06 MED ORDER — KETOROLAC TROMETHAMINE 30 MG/ML IJ SOLN
30.0000 mg | Freq: Four times a day (QID) | INTRAMUSCULAR | Status: DC | PRN
  Administered 2018-06-06: 30 mg via INTRAVENOUS

## 2018-06-06 MED ORDER — FENTANYL CITRATE (PF) 100 MCG/2ML IJ SOLN
INTRAMUSCULAR | Status: DC | PRN
  Administered 2018-06-06: 50 ug via INTRAVENOUS
  Administered 2018-06-06: 25 ug via INTRAVENOUS
  Administered 2018-06-06: 20 ug via INTRATHECAL
  Administered 2018-06-06: 25 ug via INTRAVENOUS
  Administered 2018-06-06: 50 ug via INTRAVENOUS
  Administered 2018-06-06: 30 ug via INTRAVENOUS

## 2018-06-06 MED ORDER — LACTATED RINGERS IV SOLN
INTRAVENOUS | Status: DC | PRN
  Administered 2018-06-06: 02:00:00 via INTRAVENOUS

## 2018-06-06 MED ORDER — SODIUM CHLORIDE 0.9 % IV SOLN
INTRAVENOUS | Status: DC | PRN
  Administered 2018-06-06: 50 ug/min via INTRAVENOUS

## 2018-06-06 SURGICAL SUPPLY — 36 items
CANISTER SUCT 3000ML PPV (MISCELLANEOUS) ×3 IMPLANT
DERMABOND ADVANCED (GAUZE/BANDAGES/DRESSINGS) ×2
DERMABOND ADVANCED .7 DNX12 (GAUZE/BANDAGES/DRESSINGS) ×4 IMPLANT
DRSG TELFA 3X8 NADH (GAUZE/BANDAGES/DRESSINGS) IMPLANT
ELECT CAUTERY BLADE 6.4 (BLADE) ×3 IMPLANT
ELECT REM PT RETURN 9FT ADLT (ELECTROSURGICAL) ×3
ELECTRODE REM PT RTRN 9FT ADLT (ELECTROSURGICAL) ×2 IMPLANT
GAUZE SPONGE 4X4 12PLY STRL (GAUZE/BANDAGES/DRESSINGS) IMPLANT
GAUZE SPONGE 4X4 16PLY XRAY LF (GAUZE/BANDAGES/DRESSINGS) ×3 IMPLANT
GLOVE BIOGEL PI IND STRL 7.0 (GLOVE) ×2 IMPLANT
GLOVE BIOGEL PI INDICATOR 7.0 (GLOVE) ×1
GLOVE PI ORTHOPRO 6.5 (GLOVE) ×1
GLOVE PI ORTHOPRO STRL 6.5 (GLOVE) ×2 IMPLANT
GLOVE PROTEXIS LATEX SZ 7.5 (GLOVE) ×3 IMPLANT
GLOVE SKINSENSE NS SZ7.5 (GLOVE) ×1
GLOVE SKINSENSE STRL SZ7.5 (GLOVE) ×2 IMPLANT
GLOVE SURG SYN 6.5 ES PF (GLOVE) ×3 IMPLANT
GOWN STRL REUS W/ TWL LRG LVL3 (GOWN DISPOSABLE) ×6 IMPLANT
GOWN STRL REUS W/TWL LRG LVL3 (GOWN DISPOSABLE) ×3
NS IRRIG 1000ML POUR BTL (IV SOLUTION) ×3 IMPLANT
PACK C SECTION AR (MISCELLANEOUS) ×3 IMPLANT
PAD OB MATERNITY 4.3X12.25 (PERSONAL CARE ITEMS) ×6 IMPLANT
PAD PREP 24X41 OB/GYN DISP (PERSONAL CARE ITEMS) ×3 IMPLANT
STRAP SAFETY 5IN WIDE (MISCELLANEOUS) ×3 IMPLANT
STRIP CLOSURE SKIN 1/2X4 (GAUZE/BANDAGES/DRESSINGS) IMPLANT
SUT MNCRL 4-0 (SUTURE)
SUT MNCRL 4-0 27XMFL (SUTURE)
SUT PDS AB 1 TP1 96 (SUTURE) IMPLANT
SUT VIC AB 0 CT1 36 (SUTURE) ×12 IMPLANT
SUT VIC AB 2-0 CT1 27 (SUTURE) ×3
SUT VIC AB 2-0 CT1 TAPERPNT 27 (SUTURE) ×6 IMPLANT
SUT VIC AB 3-0 SH 27 (SUTURE) ×1
SUT VIC AB 3-0 SH 27X BRD (SUTURE) ×2 IMPLANT
SUTURE MNCRL 4-0 27XMF (SUTURE) IMPLANT
SWABSTK COMLB BENZOIN TINCTURE (MISCELLANEOUS) IMPLANT
SYR 30ML LL (SYRINGE) ×9 IMPLANT

## 2018-06-06 NOTE — Progress Notes (Signed)
Pt called out at 2345 (on 06-05-18) to report light pink color fluid seen on toilet paper when up to void, in the toilet and constant back pain; RN in room; VS all WNL and FHT is 133 at 0000; both SL sites flushed at 0000; midnight dose of antibiotic given as well at 0000; pt has grimace on her face and says the "back pain is not going away, it started as soon as I stood up to go to the bathroom and I tried laying different positions and it's not helping so I called you"; while RN checking pt and talking with her, pt says "oh, I just felt a little gush, can you check my pad"; RN looked at peri pad and small amount of pink tinged fluid on peri pad; RN asked if pt has ever seen the pink tinged fluid on her peri pad this admission and pt said no; RN called Dr. Elesa MassedWard; Dr. Elesa MassedWard said to get pt to L&D to put on the monitor; RN called [place] for room assignment (obs 4); when RN back to room to discuss this with pt, pt said "I just had a sharp pain in my lower abdomen"; RN discussed with pt going to L&D unit to be on the monitor; RN wheeled pt via wheelchair to obs 4 and gave Kemper Duriesarah apel, RN report

## 2018-06-06 NOTE — Anesthesia Preprocedure Evaluation (Signed)
Anesthesia Evaluation  Patient identified by MRN, date of birth, ID band Patient awake    Reviewed: Allergy & Precautions, H&P , NPO status , Patient's Chart, lab work & pertinent test results, reviewed documented beta blocker date and time   Airway Mallampati: II  TM Distance: >3 FB Neck ROM: full    Dental no notable dental hx. (+) Teeth Intact   Pulmonary neg pulmonary ROS, Current Smoker,    Pulmonary exam normal breath sounds clear to auscultation       Cardiovascular Exercise Tolerance: Good negative cardio ROS   Rhythm:regular Rate:Normal     Neuro/Psych  Headaches, PSYCHIATRIC DISORDERS Anxiety Depression negative neurological ROS  negative psych ROS   GI/Hepatic negative GI ROS, Neg liver ROS,   Endo/Other  negative endocrine ROSdiabetes  Renal/GU      Musculoskeletal   Abdominal   Peds  Hematology negative hematology ROS (+)   Anesthesia Other Findings   Reproductive/Obstetrics (+) Pregnancy                             Anesthesia Physical Anesthesia Plan  ASA: II and emergent  Anesthesia Plan: Spinal   Post-op Pain Management:    Induction:   PONV Risk Score and Plan:   Airway Management Planned:   Additional Equipment:   Intra-op Plan:   Post-operative Plan:   Informed Consent: I have reviewed the patients History and Physical, chart, labs and discussed the procedure including the risks, benefits and alternatives for the proposed anesthesia with the patient or authorized representative who has indicated his/her understanding and acceptance.     Plan Discussed with:   Anesthesia Plan Comments: (Hx of low back pain and risks of anesthesia reviewed with patient.  She desires to proceed with sab for the above procedures and accepts risks and benefits as discussed. JA)        Anesthesia Quick Evaluation

## 2018-06-06 NOTE — Anesthesia Postprocedure Evaluation (Signed)
Anesthesia Post Note  Patient: Rachel [name]  Procedure(s) Performed: CESAREAN SECTION WITH BILATERAL TUBAL LIGATION (Abdomen)  Patient location during evaluation: Mother Baby Anesthesia Type: Spinal Level of consciousness: awake, awake and alert and oriented Pain management: pain level controlled Vital Signs Assessment: post-procedure vital signs reviewed and stable Respiratory status: spontaneous breathing, nonlabored ventilation and respiratory function stable Cardiovascular status: blood pressure returned to baseline and stable Postop Assessment: no headache and no backache Anesthetic complications: no     Last Vitals:  Vitals:   06/06/18 0718 06/06/18 0719  BP:    Pulse: 72   Resp: 14 20  Temp:    SpO2: 96%     Last Pain:  Vitals:   06/06/18 0707  TempSrc: Oral  PainSc: 0-No pain                 Ginger CarneStephanie Carlos Heber

## 2018-06-06 NOTE — Lactation Note (Signed)
This note was copied from a baby's chart. Lactation Consultation Note  Patient Name: Boy Perrin SmackBrittany Spielberg Today's Date: 06/06/2018     Maternal Data  Symphony medela electric breast pump set up at mom's bedside and mom instructed in use of pump, pumped breasts on premie setting and obtained 10 cc colostrum, [facility] and given to T. [place] RN  Feeding    LATCH Score                   Interventions    Lactation Tools Discussed/Used     Consult Status      Dyann KiefMarsha D Tujuana Kilmartin 06/06/2018, 3:35 PM

## 2018-06-06 NOTE — Anesthesia Post-op Follow-up Note (Signed)
Anesthesia QCDR form completed.        

## 2018-06-06 NOTE — Progress Notes (Signed)
ANTEPARTUM PROGRESS NOTE  Rachel [name] is a 37 y.o. [id number] at [redacted]w[redacted]d who is admitted for PPROM.   Estimated Date of Delivery: 08/08/18  Length of Stay:  3 Days. Admitted 06/03/2018  Subjective: Feeling pretty good, does not like the feeling on mag sulfate - feels hungover.  Bored.  Having friends visit and this is lifting her mood.  She says she is trying to remain calm and stay positive.    She reports:  -active fetal movement -no vaginal bleeding -no contractions -no tenderness to palpation of uterus -no fevers/chills  Vitals:  BP 105/66 (BP Location: Left Arm)   Pulse 72   Temp 98.2 F (36.8 C) (Oral)   Resp 20   Ht 5\' 5"  (1.651 m)   Wt 85.7 kg   SpO2 99%   BMI 31.45 kg/m  Physical Examination: General:   alert, cooperative, appears stated age and no distress  Skin:  normal and no rash or abnormalities  Neurologic:    Alert & oriented x 3  Lungs:   clear to auscultation bilaterally  Heart:   regular rate and rhythm, S1, S2 normal, no murmur, click, rub or gallop  Abdomen:  soft, non-tender; bowel sounds normal; no masses,  no organomegaly  Pelvis:  Exam deferred.  FHT:  125 BPM  Presentations: breech  Cervix:  Deferred  Extremities: : non-tender, symmetric, no edema bilaterally.  DTRs: +2    Fetal monitoring: FHR: 125 bpm, Variability: moderate, Accelerations: 15x15s and 10x10s present, Decelerations: occasional shallow variable Uterine activity: toco quiet  Results for orders placed or performed during the hospital encounter of 06/03/18 (from the past 48 hour(s))  Magnesium     Status: Abnormal   Collection Time: 06/04/18  3:04 AM  Result Value Ref Range   Magnesium 4.9 (H) 1.7 - 2.4 mg/dL    Comment: Performed at [facility], [address] [place], [place] [address]  CBC     Status: Abnormal   Collection Time: 06/06/18  1:46 AM  Result Value Ref Range   WBC 7.4 3.6 - 11.0 K/uL   RBC 3.75 (L) 3.80 - 5.20 MIL/uL   Hemoglobin 10.1 (L) 12.0  - 16.0 g/dL   HCT 95.6 (L) 21.3 - 08.6 %   MCV 80.9 80.0 - 100.0 fL   MCH 26.8 26.0 - 34.0 pg   MCHC 33.2 32.0 - 36.0 g/dL   RDW 57.8 46.9 - 62.9 %   Platelets 185 150 - 440 K/uL    Comment: Performed at [facility], [address], [place], [place] [address]   Current scheduled medications . cephALEXin  500 mg Oral Q6H  . docusate sodium  100 mg Oral Daily  . oxyCODONE-acetaminophen  1 tablet Oral QHS  . prenatal multivitamin  1 tablet Oral Q1200  . sertraline  150 mg Oral Daily  . sodium chloride flush  10 mL Intravenous Q8H    I have reviewed the patient's current medications.  ASSESSMENT: Patient Active Problem List   Diagnosis Date Noted  . Preterm premature rupture of membranes in third trimester 06/03/2018  . Long term current use of opiate analgesic 07/31/2016  . 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 Chronic shoulder pain (Location of Secondary source of pain) (Left) 07/31/2016  . Chronic lower extremity pain (Location of Tertiary source of pain) (Bilateral) (R>L) 07/31/2016  . Chronic low back pain (Location of Primary Source of Pain) (Bilateral) (R>L) 12/16/2015  . Supervision of normal pregnancy 08/25/2015  .  Dysthymia 03/06/2015    PLAN:  PPROM:  IUP  s/p 2 doses of betamethasone for fetal lung maturation  Occasional variable deceleration, but overall reassuring fetal monitoring   Magnesium sulfate to be completed at 1630.  Will reassess and likely transfer to floor then.  Hep-loc, daily NSTs, q2h temp and fetal heart rate, reg diet, bathroom ok otherwise bedrest, wheelchair ride daily 15mins.  Growth ultrasound showed 43%ile growth and expected low fluid  Will base interventions on maternal or fetal status  NICU consult   Neuroprotection   Magnesium sulfate Started 06/03/18 at 1630.  Running x 48hrs during steroid administration.  Adequate urine output, reflexes WNL.   Antibiotics for latency  S/p Azithromycin 1g    Cefazolin 1g q8h IV x 48 hours, first dose administered 06/03/18 at 1540 - last dose today, then:  Cephalexin 500mg  QID x 5 days once IV antibiotics are completed  GBS negative, GCCT negative per in-office swabs  Mode of delivery: cesarean due to history of cesarean and breech  Goal of 34 weeks if no compromise of maternal or fetal status  Remain inpatient until delivery   ----- Ranae Plumberhelsea Chayce Rullo, MD Attending Obstetrician and Gynecologist [facility], Department of OB/GYN [facility]

## 2018-06-06 NOTE — Progress Notes (Signed)
It is documented at least 3 times in her prenatal chart that patient desired a tubal ligation at time of scheduled cesarean.  In our office we typically discuss this and sign consents if necessary at 30 weeks.  Due to the nature of her visit at 30 weeks, this was not discussed and she was admitted instead.    The urgency surrounding her change in disposition and quick actions to get her set up for a cesarean due to concern for abruption and/or ascending infection, the request for tubal ligation was overlooked when completing the consent form.    I asked the patient during the surgery if she still wanted her tubes tied.  She said yes.  I asked the circulating nurse to make sure I could proceed ethically/legally with non-sedated verbal consent from the patient.  I was told the answer was yes.  I asked again prior to procedure, that not knowing how Greig Castillandrew (baby boy) would fare, does she still want her tubes tied, and she said yes.    Thus, I proceeded with a bilateral tubal ligation.   Due to the emergent nature and prematurity of the neonate, the consent will be signed after the surgery.   ----- Ranae Plumberhelsea Zubin Pontillo, MD Attending Obstetrician and Gynecologist [facility], Department of OB/GYN [facility]

## 2018-06-06 NOTE — Anesthesia Post-op Follow-up Note (Signed)
  Anesthesia Pain Follow-up Note  Patient: Rachel [name]  Day #: 1  Date of Follow-up: 06/06/2018 Time: 8:38 AM  Last Vitals:  Vitals:   06/06/18 0718 06/06/18 0719  BP:    Pulse: 72   Resp: 14 20  Temp:    SpO2: 96%     Level of Consciousness: alert  Pain: mild   Side Effects:None  Catheter Site Exam:clean, dry, no drainage     Plan: D/C from anesthesia care at surgeon's request  [facility] Kolbe [place]

## 2018-06-06 NOTE — Anesthesia Procedure Notes (Signed)
Spinal  Patient location during procedure: OR Staffing Performed: anesthesiologist  Preanesthetic Checklist Completed: patient identified, site marked, surgical consent, pre-op evaluation, timeout performed, IV checked, risks and benefits discussed and monitors and equipment checked Spinal Block Patient position: sitting Prep: Betadine Patient monitoring: heart rate, continuous pulse ox, blood pressure and cardiac monitor Approach: midline Location: L5-S1 Injection technique: single-shot Needle Needle type: Whitacre and Introducer  Needle gauge: 24 G Needle length: 9 cm Assessment Sensory level: T6 Additional Notes Negative paresthesia. Negative blood return. Positive free-flowing CSF. Expiration date of kit checked and confirmed. Patient tolerated procedure well, without complications.

## 2018-06-06 NOTE — Transfer of Care (Signed)
Immediate Anesthesia Transfer of Care Note  Patient: Rachel [name]  Procedure(s) Performed: CESAREAN SECTION WITH BILATERAL TUBAL LIGATION (Abdomen)  Patient Location: Mother/Baby  Anesthesia Type:Spinal  Level of Consciousness: awake, alert  and oriented  Airway & Oxygen Therapy: Patient Spontanous Breathing  Post-op Assessment: Post -op Vital signs reviewed and stable  Post vital signs: stable  Last Vitals:  Vitals Value Taken Time  BP    Temp    Pulse    Resp    SpO2      Last Pain:  Vitals:   06/06/18 0013  TempSrc: Oral  PainSc:       Patients Stated Pain Goal: 0 (06/04/18 1926)  Complications: No apparent anesthesia complications

## 2018-06-06 NOTE — Op Note (Signed)
Cesarean Section Procedure Note  06/06/2018  Patient:  Rachel [name]  37 y.o. female at 7550w0d.  No LMP recorded. Preoperative diagnosis:  PPROM, bleeding and pain, previous cesarean, breech Postoperative diagnosis: same, Live born female requiring transfer to NICU  PROCEDURE:  Procedure(s): CESAREAN SECTION WITH BILATERAL TUBAL LIGATION   Surgeon:  Surgeon(s) and Role:    * Myalynn Lingle, Elenora Fenderhelsea C, MD - Primary Anesthesia:  spinal I/O: Total I/O In: -  Out: 1050 [Urine:600; Blood:450] Specimens:  Cord Gas - arterial and venous, placenta, portion of right tube, portion of left tube Complications: None Apparent Disposition:  VS stable to PACU  Findings:  Mild bicornuate or arcuate uterus, normal appearing tubes and ovaries bilaterally. Live born female, tight nuchal cord x2 with tight body cord x1.  Complete Breech. Birth Weight: 3 lb 12.3 oz (1710 g) APGAR: 6, 10 Arterial pH: 7.26 CO2: 64. BE: 2.6 Venous pH: 7.21, O2 61, CO2 68  Newborn Delivery   Birth date/time:  06/06/2018 03:06:00 Delivery type:  C-Section, Low Transverse Trial of labor:  No C-section categorization:  Repeat     Indication for procedure: 37 y.o. female at 1550w0d who presented with PPROM at 30+4.  She is s/p steroids, magnesium sulfate, and IV antibiotics for latency.  At 30+ 6 she complained of back pain, followed by persistent dark-pink tinged fluid, and uterine tenderness, concerning for abruption and/or chorioamnionitis.  She has previously had a cesarean, was planning on repeat with BTL, and fetus was breech.  Procedure Details   The risks, benefits, complications, treatment options, and expected outcomes were discussed with the patient. Informed consent was obtained. The patient was taken to Operating Room, identified as Rachel [name] and the procedure verified as a cesarean delivery.  DURING procedure, it was remembered that the patient had requested a tubal ligation for permanent sterilization in  the office, confirmed at more than one visit, and expressed this desire once again.  It was confirmed that she had not received any sedatives or narcotics, and was certain she wanted her tubes tied no matter what happened with the health or outcome of her premature infant.   After administration of anesthesia, the patient was prepped and draped in the usual sterile manner, including a vaginal prep. A surgical time out was performed, with the pediatric team present. After confirming adequate anesthesia, a Pfannenstiel incision was made and carried down through the subcutaneous tissue to the fascia. Fascial incision was made and extended transversely. The fascia was separated from the underlying rectus tissue superiorly and inferiorly. The rectus muscles were divided in the midline. The peritoneum was identified and entered. The rectus muscles were taut and rigid, and would not stretch laterally. The peritoneum was further divided but did not allow a larger orifice.  The muscles were then divided with the bovie bilaterally at each apex of the fascial attachment to provide more room to access the abdominal cavity; but remained very tight.  A low transverse uterine incision was made. The feet were grasped and delivered through the hysterotomy.  The back was then rotated in attempt to release a tight body cord.  Right anterior shoulder was swept and delivered and then body rotated so Left anterior shoulder was delivered.  Tight nuchal cord x 2 was exposed.  Slight head entrapment was encountered through both the hysterotomy and the rectus muscles, but eventually delivered through.  The umbilical cord was unwrapped. The plan was for an attempt for delayed cord clamping but due to the  condition of the cord, this was deferred. The umbilical cord was doubly clamped and cut, and the baby was handed off to the awaitng pediatrician.  A section of cord was removed for collection of umbilical cord gas evaluation, arterial and  venous. The placenta was removed intact and appeared normal. The uterus was delivered from the abdominal cavity and cleared of clots, membranes, and debris. The uterus, tubes and ovaries appeared normal. The uterine incision was closed with running locking sutures of 0 Vicryl, and then a second, imbricating stitch was placed. Hemostasis was observed.   The attention was turned to the bilateral fallopian tubes.  The tubes were traced to their fimbriated ends, and grasped at the middle of the isthmus.  The mesosalpinx was opened, and suture ligation was placed at the proximal and distal end of the tube.  The portion of tube between the suture was divided and handed to nursing as portion of left tube and portion of right tube.  These sites were hemostatic.  The abdominal cavity was evacuated of extraneous fluid. The uterus was returned to the abdominal cavity and again the incision was inspected for hemostasis, which was confirmed.  The paracolic gutters were cleared, and the tubal ligation sutures were observed to be intact.  The fascia was then reapproximated with running suture of vicryl. 80cc of Long- and short-acting bupivicaine was injected circumferentially into the fascia.  After a change of gloves, the subcutaneous tissue was irrigated and reapproximated with 3-0 vicryl. The skin was closed with 4-0 Monocryl and 20cc of long- and short-acting bupivacaine injected into the skin and subcutaneous tissues.  The incision was covered with surgical glue.     Instrument, sponge, and needle counts were correct prior the abdominal closure and at the conclusion of the case.   I was present and performed this procedure in its entirety.  VTE: SCDs Perioperative antibiotics: Ancef 2g and Azithromycin 500mg   ----- Ranae Plumber, MD Attending Obstetrician and Gynecologist [facility], Department of OB/GYN [facility]

## 2018-06-06 NOTE — OR Nursing (Addendum)
Patient did not have written consent for tubal ligation, previously MD had discussed this with patient in the office. Pt alert and oriented, and verbalized adamantly that she desires tubal ligation at this time. CRNA confirmed that pt had not had any sedative medications at this time. Dianna OR director also contacted per MD request who states that we can proceed with procedure due to verbal consent and age.  Prior to tubal Dr. Elesa MassedWard, asked pt again you are sure that you want this, not knowing what may happen with "[name]"? Patient verbally agreed Yes.At that time MD proceeded with bilateral tubal ligation  Verbal consent signed XB:JYNWGNby:Timo Hartwig Magdalene Mollyobbins RN, Irving BurtonJennifer Bachich as well as Dr. Elesa MassedWard

## 2018-06-06 NOTE — Progress Notes (Signed)
Antepartum note   Patient was transferred to the antepartum floor after receiving mag x 48h, IV abx for latency, and steroids for fetal lung maturity. She was started on PO cephalexin due to PCN allergy.  After uneventful transfer, she began to complain of back pain for which she was given tylenol and noted relief.  The pain returned, and she noted pink discharge/fluid on her pad which did not resolve.  She was brought to triage for NST and for further evaluation.  Amniotic fluid is dark pink tinged, and uniform.  Uterus is diffusely tender, R>L, and remainder of abdomen is benign.   Due to the signs concerning for abruption and/or ascending infection, it is prudent to move to delivery.   NICU team is to meet with patient now to discuss expectations of 31wk delivery.  EFW: 1700g US: breech FHT: 125 mod +accels no decels Temp: 98.2 @ ~00:00  Anesthesia and OR team notified of decision.   Ancef 2g ordered + repeat dose of Azithromycin Stat CBC ordered, last entry was on admission.  ----- Ranae Plumberhelsea Lynda Capistran, MD Attending Obstetrician and Gynecologist [facility], Department of OB/GYN [facility]

## 2018-06-07 LAB — CBC
HEMATOCRIT: 28.8 % — AB (ref 35.0–47.0)
HEMOGLOBIN: 9.7 g/dL — AB (ref 12.0–16.0)
MCH: 27.4 pg (ref 26.0–34.0)
MCHC: 33.6 g/dL (ref 32.0–36.0)
MCV: 81.5 fL (ref 80.0–100.0)
Platelets: 175 10*3/uL (ref 150–440)
RBC: 3.53 MIL/uL — ABNORMAL LOW (ref 3.80–5.20)
RDW: 14.3 % (ref 11.5–14.5)
WBC: 8 10*3/uL (ref 3.6–11.0)

## 2018-06-07 NOTE — Progress Notes (Signed)
Subjective: Postpartum Day 1: Repeat Cesarean Delivery with BTL on 8/30 at 31 wks for placental abruption and PPROM Patient reports incisional pain and tolerating PO.    Objective: Vital signs in last 24 hours: Temp:  [97.8 F (36.6 C)-98.6 F (37 C)] 98.2 F (36.8 C) (08/31 0728) Pulse Rate:  [60-82] 60 (08/31 0728) Resp:  [16-18] 18 (08/31 0728) BP: (84-105)/(62-74) 99/64 (08/31 0728) SpO2:  [97 %-99 %] 99 % (08/31 0728)  Physical Exam:  General: alert and cooperative Lochia: appropriate Uterine Fundus: firm Incision: healing well, no significant drainage, no dehiscence DVT Evaluation: No evidence of DVT seen on physical exam. Negative Homan's sign.  Recent Labs    06/06/18 1759 06/07/18 0500  HGB 9.6* 9.7*  HCT 29.0* 28.8*    Assessment/Plan: Status post Cesarean section. Doing well postoperatively.  Continue current postpartum care. - Acute blood loss anemia : PO iron   Rachel DouglasBethany Thad [name] 06/07/2018, 3:46 PM

## 2018-06-07 NOTE — Progress Notes (Signed)
Ward MD called to check to see if patient has been signed up for medicaid yet. Pt has not. Called AC to find out who does that and she stated that SW does. SW consult put in.

## 2018-06-08 NOTE — Progress Notes (Signed)
Patient sleeping at 0730. RN went back in patient room at 0900 for vitals and morning assessment. Patient still sleeping. Patient mom asked that RN not wake patient and that RN come back. Patient's mom states that she was "hurting all night and needs to sleep." RN and patient's mom agreed to wake patient at 1000 for morning assessment and vitals. RN to bring pain medicine at that time.
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
## 2018-06-08 NOTE — Progress Notes (Signed)
Subjective: Postpartum Day 2: Cesarean Delivery Patient reports no problems . Incisional pain better Objective: Vital signs in last 24 hours: Temp:  [98.3 F (36.8 C)-99.3 F (37.4 C)] 98.3 F (36.8 C) (09/01 0957) Pulse Rate:  [66-79] 79 (09/01 0957) Resp:  [17-18] 18 (09/01 0957) BP: (103-115)/(64-74) 103/67 (09/01 0957) SpO2:  [97 %-99 %] 98 % (09/01 0957)  Physical Exam:  General: alert and cooperative Lochia: appropriate Uterine Fundus: firm Incision: healing well DVT Evaluation: No evidence of DVT seen on physical exam.  Recent Labs    06/06/18 1759 06/07/18 0500  HGB 9.6* 9.7*  HCT 29.0* 28.8*    Assessment/Plan: Status post Cesarean section. Doing well postoperatively.  Continue current care.  Ihor Austin Bard Haupert 06/08/2018, 11:46 AM

## 2018-06-08 NOTE — Lactation Note (Signed)
This note was copied from a baby's chart. Lactation Consultation Note  Patient Name: Rachel [name] [id number] Date: 06/08/2018  Mom has been consistently pumping.  She is getting concerned because was pumping no less than 5 ml and up to 13 ml of colostrum and since yesterday is only getting drops.  Explained normal course of lactation.  Observed mom pumping for this session.  Demonstrated breast massage and hand expression before pumping and got over 10 ml.  Mom verbalizes starting to feel fuller.  Mom did not breast feed first baby.  She reports breast feeding her last baby for 18 month.  He weighed 10 lbs and had lots of breast milk.  Mom has a Medela DEBP at home.  Mother and grandmother of baby had lots of lactation questions that were answered.   Maternal Data    Feeding Feeding Type: Breast Milk  LATCH Score                   Interventions    Lactation Tools Discussed/Used     Consult Status      Louis Meckel 06/08/2018, 2:48 PM

## 2018-06-09 MED ORDER — OXYCODONE HCL 5 MG PO TABS: 5.0000 mg | ORAL_TABLET | ORAL | 0 refills | Status: DC | PRN

## 2018-06-09 NOTE — Progress Notes (Signed)
Patient discharged home. Discharge instructions, prescriptions and follow up appointment given to and reviewed with patient. Patient verbalized understanding. Infant still in SCN. Patient walked to SCN to see infant before leaving.

## 2018-06-09 NOTE — Discharge Instructions (Signed)

## 2018-06-10 LAB — SURGICAL PATHOLOGY

## 2018-06-18 ENCOUNTER — Ambulatory Visit: Payer: Self-pay | Admitting: Family Medicine

## 2018-06-20 ENCOUNTER — Encounter: Payer: Self-pay | Admitting: Family Medicine

## 2018-06-20 ENCOUNTER — Ambulatory Visit (INDEPENDENT_AMBULATORY_CARE_PROVIDER_SITE_OTHER): Payer: Self-pay | Admitting: Family Medicine

## 2018-06-20 VITALS — BP 110/64 | HR 82 | Temp 98.5°F | Ht 65.0 in | Wt 172.4 lb

## 2018-06-20 DIAGNOSIS — O99345 Other mental disorders complicating the puerperium: Secondary | ICD-10-CM

## 2018-06-20 DIAGNOSIS — F341 Dysthymic disorder: Secondary | ICD-10-CM

## 2018-06-20 DIAGNOSIS — G8929 Other chronic pain: Secondary | ICD-10-CM

## 2018-06-20 DIAGNOSIS — F53 Postpartum depression: Secondary | ICD-10-CM

## 2018-06-20 MED ORDER — SERTRALINE HCL 100 MG PO TABS: 200.0000 mg | ORAL_TABLET | Freq: Every day | ORAL | 3 refills | Status: DC

## 2018-06-20 MED ORDER — OXYCODONE HCL 10 MG PO TABS: 10.0000 mg | ORAL_TABLET | Freq: Three times a day (TID) | ORAL | 0 refills | Status: DC

## 2018-06-20 MED ORDER — KETOROLAC TROMETHAMINE 10 MG PO TABS: 10.0000 mg | ORAL_TABLET | Freq: Four times a day (QID) | ORAL | 0 refills | Status: DC | PRN

## 2018-06-20 NOTE — Patient Instructions (Signed)
STAY UNDER 30 MG PER DAY OF OXYCODONE WHILE BREASTFEEDING.

## 2018-06-20 NOTE — Progress Notes (Signed)
Rachel [name] is a 37 y.o. female is here for follow up.  History of Present Illness:   Rachel [name], CMA acting as scribe for Dr. Helane RimaErica Alpha Mysliwiec.   HPI: Patient in for follow up on medication. She would like to go up to the 200mg  on Zoloft she was on in the past.   Emergency section on 06/06/18. Son born at 21 weeks. He is doing well in NICU. She is pumping q two hours to provide breast milk. With postpartum depression. No SI. Healing well from surgery. Eating regularly and drinking plenty of water. Taking Aleve and Oxycodone 5 mg q 6 hours prn pain. She was able to get to 5 mg Oxycodone daily prior to delivery. Newborn without issues related to opioids.   Back pain is severe. She wants to breastfeed for at least 6 months.   Health Maintenance Due  Topic Date Due  . INFLUENZA VACCINE  05/08/2018   Depression screen [facility] 2/9 03/21/2018 01/21/2018 07/31/2016  Decreased Interest 0 0 0  Down, Depressed, Hopeless 0 0 0  PHQ - 2 Score 0 0 0  Altered sleeping 0 0 -  Tired, decreased energy 0 0 -  Change in appetite 0 0 -  Feeling bad or failure about yourself  0 0 -  Trouble concentrating 0 0 -  Moving slowly or fidgety/restless 0 0 -  Suicidal thoughts 0 0 -  PHQ-9 Score 0 0 -  Difficult doing work/chores Not difficult at all Not difficult at all -   PMHx, SurgHx, SocialHx, FamHx, Medications, and Allergies were reviewed in the Visit Navigator and updated as appropriate.   Patient Active Problem List   Diagnosis Date Noted  . Preterm premature rupture of membranes in third trimester 06/03/2018  . Long term current use of opiate analgesic 07/31/2016  . 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 Chronic shoulder pain (Location of Secondary source of pain) (Left) 07/31/2016  . Chronic lower extremity pain (Location of Tertiary source of pain) (Bilateral) (R>L) 07/31/2016  . Chronic low back pain (Location of Primary Source of Pain) (Bilateral) (R>L) 12/16/2015  .  Supervision of normal pregnancy 08/25/2015  . Dysthymia 03/06/2015   Social History   Tobacco Use  . Smoking status: Never Smoker  . Smokeless tobacco: Never Used  Substance Use Topics  . Alcohol use: No  . Drug use: No   Current Medications and Allergies:   .  oxyCODONE (OXY IR/ROXICODONE) 5 MG immediate release tablet, Take 1 tablet (5 mg total) by mouth every 4 (four) hours as needed (pain scale 4-7)., Disp: 30 tablet, Rfl: 0   Allergies  Allergen Reactions  . Penicillins Anaphylaxis  . Sulfa Antibiotics Rash   Review of Systems   Pertinent items are noted in the HPI. Otherwise, ROS is negative.  Vitals:   Vitals:   06/20/18 1419  BP: 110/64  Pulse: 82  Temp: 98.5 F (36.9 C)  TempSrc: Oral  SpO2: 96%  Weight: 172 lb 6.4 oz (78.2 kg)  Height: 5\' 5"  (1.651 m)     Body mass index is 28.69 kg/m.  Physical Exam:   Physical Exam  Constitutional: She appears well-nourished.  HENT:  Head: Normocephalic and atraumatic.  Eyes: Pupils are equal, round, and reactive to light. EOM are normal.  Neck: Normal range of motion. Neck supple.  Cardiovascular: Normal rate, regular rhythm, normal heart sounds and intact distal pulses.  Pulmonary/Chest: Effort normal.  Abdominal: Soft.  Skin: Skin is warm.  Psychiatric:  She has a normal mood and affect. Her behavior is normal.  Nursing note and vitals reviewed.  Assessment and Plan:   [name] was seen today for follow-up.  Diagnoses and all orders for this visit:  Chronic pain Comments: Okay to increase Oxycodone to previous dosage. Guidelines to stay < 30 mg/day if breastfeeding. She will pump just before dose. Orders: -     Oxycodone HCl 10 MG TABS; Take 1 tablet (10 mg total) by mouth 3 (three) times daily. -     ketorolac (TORADOL) 10 MG tablet; Take 1 tablet (10 mg total) by mouth every 6 (six) hours as needed. -     Oxycodone HCl 10 MG TABS; Take 1 tablet (10 mg total) by mouth 3 (three) times daily. -      Oxycodone HCl 10 MG TABS; Take 1 tablet (10 mg total) by mouth 3 (three) times daily.  Dysthymia Comments: Chronic but worsened with situation. Okay Zoloft 200 mg daily.  Orders: -     sertraline (ZOLOFT) 100 MG tablet; Take 2 tablets (200 mg total) by mouth daily.  Postpartum depression Comments: Discussed self-care during this time. Offered therapy and she is interested. Will reach out to Dr. Pascal Lux.    . Reviewed expectations re: course of current medical issues. . Discussed self-management of symptoms. . Outlined signs and symptoms indicating need for more acute intervention. . Patient verbalized understanding and all questions were answered. [name] Health Maintenance issues including appropriate healthy diet, exercise, and smoking avoidance were discussed with patient. . See orders for this visit as documented in the electronic medical record. . Patient received an After Visit Summary.  CMA served as Neurosurgeon during this visit. History, Physical, and Plan performed by medical provider. The above documentation has been reviewed and is accurate and complete. Helane Rima, D.O.   Helane Rima, DO [place] [place] [facility] 06/21/2018

## 2018-06-21 ENCOUNTER — Encounter: Payer: Self-pay | Admitting: Family Medicine

## 2018-06-23 ENCOUNTER — Ambulatory Visit: Payer: Self-pay | Admitting: Family Medicine

## 2018-06-30 ENCOUNTER — Ambulatory Visit: Payer: Self-pay

## 2018-06-30 NOTE — Lactation Note (Signed)
This note was copied from a baby's chart. Lactation Consultation Note  Patient Name: Rachel [name] [id number]'s Date: 06/30/2018 Reason for consult: NICU baby;Late-preterm 34-36.6wks First practice feeding at breast  Maternal Data    Feeding Feeding Type: Breast Fed Length of feed: 15 min Tired easily, milk in 20 mm nipple shield, baby mostly just swallowed this milk and held nipple in mouth LATCH Score Latch: Repeated attempts needed to sustain latch, nipple held in mouth throughout feeding, stimulation needed to elicit sucking reflex.  Audible Swallowing: A few with stimulation  Type of Nipple: Everted at rest and after stimulation  Comfort (Breast/Nipple): Soft / non-tender  Hold (Positioning): Assistance needed to correctly position infant at breast and maintain latch.  LATCH Score: 7  Interventions Interventions: Assisted with latch;Adjust position;Support pillows;Breast feeding basics reviewed(LC assist)  Lactation Tools Discussed/Used Tools: Nipple Shields;26F feeding tube / Syringe   Consult Status Consult Status: PRN May need larger shield, mom will be able to put baby to breast each shift if she is here   Dyann KiefMarsha D Eduard Penkala 06/30/2018, 5:52 PM

## 2018-07-01 ENCOUNTER — Ambulatory Visit: Payer: Self-pay

## 2018-07-01 NOTE — Lactation Note (Signed)
This note was copied from a baby's chart. Lactation Consultation Note  Patient Name: Rachel [name] SmackBrittany Engebretsen ZOXWR'UToday's Date: 07/01/2018 Reason for consult: Follow-up assessment;NICU baby;Late-preterm 34-36.6wks;Infant < 6lbs  RN, OT, and Mom [facility]. We all agree that with any BF, to start with nipple shield on to minimize fatigue and choking during feeds. Mom normally pumps 6 oz from both breast every 2-3 hours. She last pumped 1 hour ago to avoid over full breast. She independently placed baby in football hold on right side correctly with proper alignment. Lips were flanged around shield. He had a few little sucks and then a swallow maybe 4 times per minute, being a bit sleepy in between. The first 5 minutes, his VSS, but last 5 minutes he had 4 brief brady while only holding nipple in mouth, not actively sucking. I had Mom remove him from breast and allow him to suck on pacifier next to the breast instead while he received his tube feeding . I praised Mom for doing such a great job with positioning, being patient with his progress and pumping so well.   Maternal Data    Feeding Feeding Type: Breast Fed  LATCH Score Latch: Repeated attempts needed to sustain latch, nipple held in mouth throughout feeding, stimulation needed to elicit sucking reflex.(with nipple shield)  Audible Swallowing: A few with stimulation  Type of Nipple: Everted at rest and after stimulation  Comfort (Breast/Nipple): Soft / non-tender  Hold (Positioning): No assistance needed to correctly position infant at breast.  LATCH Score: 8  Interventions Interventions: Breast feeding basics reviewed;Assisted with latch;Adjust position;Support pillows;Position options(pumped 1 hour ago; stop BF with brady; paci at breast with T)  Lactation Tools Discussed/Used Tools: Nipple Daisee Centner Nipple shield size: 20   Consult Status Consult Status: PRN    Sunday CornSandra Clark Hayven Croy 07/01/2018, 3:32  PM

## 2018-07-14 ENCOUNTER — Ambulatory Visit: Payer: Self-pay

## 2018-07-14 NOTE — Lactation Note (Signed)
This note was copied from a baby's chart. Lactation Consultation Note  Patient Name: Rachel [name] ZOXWR'U Date: 07/14/2018 Reason for consult: Follow-up assessment;Preterm <34wks Assisted mom with comfortable position with pillow support and nursing stool.  Rachel [name] is waking at 2 1/2 hours with hunger cues.  Mom was not here to feed until 3 hour mark since did not have to pre pump or do pre and post feeding weight.  Could easily hand express to entice him to latch.  Observed mom latch Rachel [name] using nipple shield with minimal assistance in cradle hold.  He began strong rhythmic sucking with audible swallows.  Mom had been pre pumping before putting him to breast.  Rachel [name] is handling regular flow nipple now, so mom is no longer having to pre pump.  He vigorously sucked for over 15 minutes before slowing down sucks and swallows and continued for another 5 minutes with breast massage and stimulation.  Breast was softer after feeding.  Mom reports pumping with Med[employer] at home and getting 4 to 5 oz each breast every 3 to 4 hours.  If she pumps every 1 1/2 to 2 hours gets about 2 oz per breast.  Mom did not breast feed first baby.  Breast fed last baby for 19 months with exclusive breast feeding for 1st year.  Praised mom for her commitment to breast feed and pump for [employer].        Maternal Data Formula Feeding for Exclusion: No Has patient been taught Hand Expression?: Yes Does the patient have breastfeeding experience prior to this delivery?: Yes  Feeding Feeding Type: Breast Fed  LATCH Score Latch: Grasps breast easily, tongue down, lips flanged, rhythmical sucking.  Audible Swallowing: Spontaneous and intermittent  Type of Nipple: Everted at rest and after stimulation  Comfort (Breast/Nipple): Filling, red/small blisters or bruises, mild/mod discomfort  Hold (Positioning): No assistance needed to correctly position infant at breast.  LATCH Score:  9  Interventions Interventions: Breast feeding basics reviewed;Reverse pressure;Breast compression;Breast massage;Support pillows;Position options  Lactation Tools Discussed/Used WIC Program: Yes   Consult Status Consult Status: PRN    Louis Meckel 07/14/2018, 6:18 PM

## 2018-07-18 ENCOUNTER — Encounter: Payer: Self-pay | Admitting: Family Medicine

## 2018-07-18 ENCOUNTER — Ambulatory Visit (INDEPENDENT_AMBULATORY_CARE_PROVIDER_SITE_OTHER): Payer: Self-pay

## 2018-07-18 DIAGNOSIS — Z23 Encounter for immunization: Secondary | ICD-10-CM

## 2018-07-31 ENCOUNTER — Other Ambulatory Visit: Payer: Self-pay

## 2018-08-01 ENCOUNTER — Inpatient Hospital Stay: Admit: 2018-08-01 | Payer: Self-pay | Admitting: Obstetrics & Gynecology

## 2018-08-01 SURGERY — Surgical Case
Anesthesia: Spinal | Laterality: Bilateral

## 2018-08-23 ENCOUNTER — Encounter: Payer: Self-pay | Admitting: Gynecology

## 2018-08-23 ENCOUNTER — Ambulatory Visit
Admission: EM | Admit: 2018-08-23 | Discharge: 2018-08-23 | Disposition: A | Discharge status: Home / Self Care | Payer: Self-pay | Attending: Family Medicine | Admitting: Family Medicine

## 2018-08-23 ENCOUNTER — Other Ambulatory Visit: Payer: Self-pay

## 2018-08-23 DIAGNOSIS — B9789 Other viral agents as the cause of diseases classified elsewhere: Secondary | ICD-10-CM

## 2018-08-23 DIAGNOSIS — J988 Other specified respiratory disorders: Secondary | ICD-10-CM

## 2018-08-23 HISTORY — DX: Dorsalgia, unspecified: M54.9

## 2018-08-23 MED ORDER — IPRATROPIUM BROMIDE 0.06 % NA SOLN: 2.0000 | Freq: Four times a day (QID) | NASAL | 0 refills | Status: DC | PRN

## 2018-08-23 MED ORDER — BENZONATATE 100 MG PO CAPS: 100.0000 mg | ORAL_CAPSULE | Freq: Three times a day (TID) | ORAL | 0 refills | Status: DC | PRN

## 2018-08-23 NOTE — ED Triage Notes (Signed)
Patient c/o cough. Per pain chest hurts when cough and congestion x 3 days.

## 2018-08-23 NOTE — Discharge Instructions (Signed)
This is viral.  Rest. Fluids.  Medications as prescribed.  Take care  Dr. Adriana Simasook

## 2018-08-23 NOTE — ED Provider Notes (Signed)
[facility]    CSN: [id number] Arrival date & time: 08/23/18  1018  History   Chief Complaint Cough, congestion  HPI  37 year old female presents with cough and congestion.  Started initially 3 days ago with sore throat.  Sore throat now resolved.  Current complaints of cough and congestion.  She is used Flonase last night and Motrin without resolution.  No fever.  No chills.  No known exacerbating factors.  Patient does note that her chest hurts when she coughs.  No other associated symptoms. No other complaints.  PMH, Surgical Hx, Family Hx, Social History reviewed and updated as below.  Past Medical History:  Diagnosis Date  . Allergy   . Anxiety   . Back pain   . Depression   . Fever blister 02/12/2016  . Migraines     Patient Active Problem List   Diagnosis Date Noted  . Preterm premature rupture of membranes in third trimester 06/03/2018  . Long term current use of opiate analgesic 07/31/2016  . 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 Chronic shoulder pain (Location of Secondary source of pain) (Left) 07/31/2016  . Chronic lower extremity pain (Location of Tertiary source of pain) (Bilateral) (R>L) 07/31/2016  . Chronic low back pain (Location of Primary Source of Pain) (Bilateral) (R>L) 12/16/2015  . Supervision of normal pregnancy 08/25/2015  . Dysthymia 03/06/2015    Past Surgical History:  Procedure Laterality Date  . CESAREAN SECTION N/A 08/26/2015   Procedure: CESAREAN SECTION;  Surgeon: Hildred LaserAnika Cherry, MD;  Location: [facility] ORS;  Service: Obstetrics;  Laterality: N/A;  . CESAREAN SECTION WITH BILATERAL TUBAL LIGATION  06/06/2018   Procedure: CESAREAN SECTION WITH BILATERAL TUBAL LIGATION;  Surgeon: Ward, Elenora Fenderhelsea C, MD;  Location: [facility] ORS;  Service: Obstetrics;;  . GANGLION CYST EXCISION Right wrist  . GANGLION CYST EXCISION     right hand 07/2014    OB History    Gravida  3   Para  3   Term  2   Preterm  1   AB  0   Living  3      SAB  0   TAB  0   Ectopic  0   Multiple  0   Live Births  3            Home Medications    Prior to Admission medications   Medication Sig Start Date End Date Taking? Authorizing Provider  Oxycodone HCl 10 MG TABS Take 1 tablet (10 mg total) by mouth 3 (three) times daily. 08/20/18  Yes Helane RimaWallace, Erica, DO  sertraline (ZOLOFT) 100 MG tablet Take 2 tablets (200 mg total) by mouth daily. 06/20/18  Yes Helane RimaWallace, Erica, DO  benzonatate (TESSALON) 100 MG capsule Take 1 capsule (100 mg total) by mouth 3 (three) times daily as needed. 08/23/18   Everlene Otherook, Zakaree Mcclenahan G, DO  ipratropium (ATROVENT) 0.06 % nasal spray Place 2 sprays into both nostrils 4 (four) times daily as needed for rhinitis. 08/23/18   Tommie Samsook, Kele Barthelemy G, DO    Family History Family History  Problem Relation Age of Onset  . Heart disease Mother   . Heart disease Father     Social History Social History   Tobacco Use  . Smoking status: Never Smoker  . Smokeless tobacco: Never Used  Substance Use Topics  . Alcohol use: No  . Drug use: No     Allergies   Penicillins and Sulfa antibiotics   Review of Systems Review of Systems  Constitutional: Negative for fever.  HENT: Positive for congestion and sore throat.   Respiratory: Positive for cough.    Physical Exam Triage Vital Signs ED Triage Vitals  Enc Vitals Group     BP 08/23/18 1028 105/79     Pulse Rate 08/23/18 1028 80     Resp 08/23/18 1028 16     Temp 08/23/18 1028 98.2 F (36.8 C)     Temp Source 08/23/18 1028 Oral     SpO2 08/23/18 1028 99 %     Weight 08/23/18 1027 175 lb (79.4 kg)     Height 08/23/18 1027 5\' 5"  (1.651 m)     Head Circumference --      Peak Flow --      Pain Score 08/23/18 1027 6     Pain Loc --      Pain Edu? --      Excl. in GC? --    Updated Vital Signs BP 105/79 (BP Location: Left Arm)   Pulse 80   Temp 98.2 F (36.8 C) (Oral)   Resp 16   Ht 5\' 5"  (1.651 m)   Wt 79.4 kg   LMP 08/06/2018   SpO2 99%   BMI 29.12  kg/m   Visual Acuity Right Eye Distance:   Left Eye Distance:   Bilateral Distance:    Right Eye Near:   Left Eye Near:    Bilateral Near:     Physical Exam  Constitutional: She is oriented to person, place, and time. She appears well-developed. No distress.  HENT:  Head: Normocephalic and atraumatic.  Oropharynx with mild erythema.  Neck: Neck supple.  Cardiovascular: Normal rate and regular rhythm.  Pulmonary/Chest: Effort normal and breath sounds normal. She has no wheezes. She has no rales.  Lymphadenopathy:    She has no cervical adenopathy.  Neurological: She is alert and oriented to person, place, and time.  Psychiatric: She has a normal mood and affect. Her behavior is normal.  Nursing note and vitals reviewed.  UC Treatments / Results  Labs (all labs ordered are listed, but only abnormal results are displayed) Labs Reviewed - No data to display  EKG None  Radiology No results found.  Procedures Procedures (including critical care time)  Medications Ordered in UC Medications - No data to display  Initial Impression / Assessment and Plan / UC Course  I have reviewed the triage vital signs and the nursing notes.  Pertinent labs & imaging results that were available during my care of the patient were reviewed by me and considered in my medical decision making (see chart for details).    37 year old female presents with a viral respiratory infection.  Treating with Tessalon Perles and Atrovent nasal spray.  Final Clinical Impressions(s) / UC Diagnoses   Final diagnoses:  Viral respiratory infection     Discharge Instructions     This is viral.  Rest. Fluids.  Medications as prescribed.  Take care  Dr. Adriana Simas    ED Prescriptions    Medication Sig Dispense Auth. Provider   benzonatate (TESSALON) 100 MG capsule Take 1 capsule (100 mg total) by mouth 3 (three) times daily as needed. 30 capsule Shivali Quackenbush G, DO   ipratropium (ATROVENT) 0.06 %  nasal spray Place 2 sprays into both nostrils 4 (four) times daily as needed for rhinitis. 15 mL Tommie Sams, DO     Controlled Substance Prescriptions [place] Controlled Substance Registry consulted? Not Applicable   Tommie Sams, DO 08/23/18 1053

## 2018-08-25 ENCOUNTER — Telehealth: Payer: Self-pay | Admitting: *Deleted

## 2018-08-25 ENCOUNTER — Encounter: Payer: Self-pay | Admitting: Family Medicine

## 2018-08-25 NOTE — Telephone Encounter (Signed)
Please see message. Pt wants to speak to you.

## 2018-08-25 NOTE — Telephone Encounter (Signed)
Please call patient and see what medication she needs.

## 2018-08-25 NOTE — Telephone Encounter (Signed)
Left message on voicemail to call office.  

## 2018-08-25 NOTE — Telephone Encounter (Signed)
See message regarding medication.

## 2018-08-25 NOTE — Telephone Encounter (Signed)
Copied from CRM [phone]. Topic: General - Other >> Aug 25, 2018  1:56 PM Gerrianne ScalePayne, Angela L wrote: Reason for CRM: pt calling wanting to speak with Dr Earlene PlaterWallace about medicine for lactation she had already spoken with a lactation consultant and wanted the patient to speak with her provider

## 2018-08-25 NOTE — Telephone Encounter (Signed)
Pt called back, asked her what the name of the medication is she is wanting for Lactation,  so Dr. Earlene PlaterWallace can send in. Pt said she does not remember the name she will have to call and let us know. Told pt that is fine just send in My Chart message. Pt verbalized understanding.

## 2018-08-26 MED ORDER — METOCLOPRAMIDE HCL 10 MG PO TABS: 10.0000 mg | ORAL_TABLET | Freq: Two times a day (BID) | ORAL | 0 refills | Status: DC

## 2018-08-27 ENCOUNTER — Telehealth: Payer: Self-pay | Admitting: *Deleted

## 2018-08-27 NOTE — Telephone Encounter (Signed)
Left message on voicemail to call office.  

## 2018-08-27 NOTE — Telephone Encounter (Signed)
Pt called back told her Dr. Earlene PlaterWallace found a place for you to take your son to be evaluated for free and they take Medicaid instead of coming here. Pt verbalized understanding. Info given to pt [employer] phone: [phone], [place]. Pt verbalized understanding.

## 2018-09-03 ENCOUNTER — Other Ambulatory Visit: Payer: Self-pay | Admitting: Family Medicine

## 2018-09-03 DIAGNOSIS — F341 Dysthymic disorder: Secondary | ICD-10-CM

## 2018-09-09 ENCOUNTER — Other Ambulatory Visit: Payer: Self-pay

## 2018-09-09 DIAGNOSIS — G8929 Other chronic pain: Secondary | ICD-10-CM

## 2018-09-09 MED ORDER — OXYCODONE HCL 10 MG PO TABS: ORAL_TABLET | ORAL | 0 refills | Status: DC

## 2018-09-14 ENCOUNTER — Encounter: Payer: Self-pay | Admitting: Family Medicine

## 2018-10-09 ENCOUNTER — Encounter: Payer: Self-pay | Admitting: Family Medicine

## 2018-10-12 NOTE — Progress Notes (Signed)
Rachel [name] is a 38 y.o. female is here for follow up.  History of Present Illness:   HPI:   Indication for chronic opioid: LOW BACK, HIP, SHOULDER PAIN Medication and dose: 10/325 TID # pills per month: 60 Last UDS date: 10/13/2018  Pain contract signed (Y/N): Y Date narcotic database last reviewed (include red flags): 10/13/2018 with no concerns  Hx of anemia. Due for labs.  Weight gain of 20 pounds over the past 3 months. Home with newborn and toddler. Sleep sporadic. Feels that she is eating fairly well. No exercise.   Depression screen [facility] 2/9 03/21/2018 01/21/2018 07/31/2016  Decreased Interest 0 0 0  Down, Depressed, Hopeless 0 0 0  PHQ - 2 Score 0 0 0  Altered sleeping 0 0 -  Tired, decreased energy 0 0 -  Change in appetite 0 0 -  Feeling bad or failure about yourself  0 0 -  Trouble concentrating 0 0 -  Moving slowly or fidgety/restless 0 0 -  Suicidal thoughts 0 0 -  PHQ-9 Score 0 0 -  Difficult doing work/chores Not difficult at all Not difficult at all -   PMHx, SurgHx, SocialHx, FamHx, Medications, and Allergies were reviewed in the Visit Navigator and updated as appropriate.   Patient Active Problem List   Diagnosis Date Noted  . Preterm premature rupture of membranes in third trimester 06/03/2018  . Long term current use of opiate analgesic 07/31/2016  . 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 Chronic shoulder pain (Location of Secondary source of pain) (Left) 07/31/2016  . Chronic lower extremity pain (Location of Tertiary source of pain) (Bilateral) (R>L) 07/31/2016  . Chronic low back pain (Location of Primary Source of Pain) (Bilateral) (R>L) 12/16/2015  . Supervision of normal pregnancy 08/25/2015  . Dysthymia 03/06/2015   Social History   Tobacco Use  . Smoking status: Never Smoker  . Smokeless tobacco: Never Used  Substance Use Topics  . Alcohol use: No  . Drug use: No   Current Medications and Allergies:   .  ipratropium  (ATROVENT) 0.06 % nasal spray, Place 2 sprays into both nostrils 4 (four) times daily as needed for rhinitis., Disp: 15 mL, Rfl: 0 .  Oxycodone HCl 10 MG TABS, QID for pain, Disp: 120 tablet, Rfl: 0 .  sertraline (ZOLOFT) 100 MG tablet, TAKE 2 TABLETS BY MOUTH EVERY DAY, Disp: 180 tablet, Rfl: 2   Allergies  Allergen Reactions  . Penicillins Anaphylaxis  . Sulfa Antibiotics Rash   Review of Systems   Pertinent items are noted in the HPI. Otherwise, a complete ROS is negative.  Vitals:   Vitals:   10/13/18 1016  Pulse: 96  Temp: 98 F (36.7 C)  TempSrc: Oral  SpO2: 98%  Weight: 196 lb 3.2 oz (89 kg)  Height: 5\' 5"  (1.651 m)     Body mass index is 32.65 kg/m.  Physical Exam:   Physical Exam Vitals signs and nursing note reviewed.  HENT:     Head: Normocephalic and atraumatic.  Eyes:     Pupils: Pupils are equal, round, and reactive to light.  Neck:     Musculoskeletal: Normal range of motion and neck supple.  Cardiovascular:     Rate and Rhythm: Normal rate and regular rhythm.     Heart sounds: Normal heart sounds.  Pulmonary:     Effort: Pulmonary effort is normal.  Abdominal:     Palpations: Abdomen is soft.  Skin:  General: Skin is warm.  Psychiatric:        Behavior: Behavior normal.    Assessment and Plan:   [name] was seen today for follow-up.  Diagnoses and all orders for this visit:  Chronic low back pain (Location of Primary Source of Pain) (Bilateral) (R>L) Comments: Doing well on current regimen. UDS today. Orders: -     Oxycodone HCl 10 MG TABS; Take 1 tablet (10 mg total) by mouth 4 (four) times daily. -     Oxycodone HCl 10 MG TABS; Take 1 tablet (10 mg total) by mouth 4 (four) times daily. -     Oxycodone HCl 10 MG TABS; Take 1 tablet (10 mg total) by mouth 4 (four) times daily. -     Pain Mgmt, Profile 8 w/Conf, U  Dysthymia Comments: Doing well. No changes.   Anemia, unspecified type Comments: Anemia postpartum. Will  recheck. Orders: -     CBC with Differential/Platelet -     Comprehensive metabolic panel -     Iron, TIBC and Ferritin Panel  Fatigue, unspecified type -     CBC with Differential/Platelet -     Comprehensive metabolic panel -     TSH  Screening for lipid disorders -     Lipid panel   . Orders and follow up as documented in EpicCare, reviewed diet, exercise and weight control, cardiovascular risk and specific lipid/LDL goals reviewed, reviewed medications and side effects in detail.  . Reviewed expectations re: course of current medical issues. . Outlined signs and symptoms indicating need for more acute intervention. . Patient verbalized understanding and all questions were answered. . Patient received an After Visit Summary.  Helane RimaErica Musette Kisamore, DO [place] 10/13/2018

## 2018-10-13 ENCOUNTER — Encounter: Payer: Self-pay | Admitting: Family Medicine

## 2018-10-13 ENCOUNTER — Ambulatory Visit (INDEPENDENT_AMBULATORY_CARE_PROVIDER_SITE_OTHER): Payer: Self-pay | Admitting: Family Medicine

## 2018-10-13 VITALS — HR 96 | Temp 98.0°F | Ht 65.0 in | Wt 196.2 lb

## 2018-10-13 DIAGNOSIS — R5383 Other fatigue: Secondary | ICD-10-CM

## 2018-10-13 DIAGNOSIS — Z13220 Encounter for screening for lipoid disorders: Secondary | ICD-10-CM

## 2018-10-13 DIAGNOSIS — F341 Dysthymic disorder: Secondary | ICD-10-CM

## 2018-10-13 DIAGNOSIS — M545 Low back pain, unspecified: Secondary | ICD-10-CM

## 2018-10-13 DIAGNOSIS — G8929 Other chronic pain: Secondary | ICD-10-CM

## 2018-10-13 DIAGNOSIS — D649 Anemia, unspecified: Secondary | ICD-10-CM

## 2018-10-13 LAB — COMPREHENSIVE METABOLIC PANEL
ALT: 9 U/L (ref 0–35)
AST: 10 U/L (ref 0–37)
Albumin: 4.3 g/dL (ref 3.5–5.2)
Alkaline Phosphatase: 71 U/L (ref 39–117)
BUN: 14 mg/dL (ref 6–23)
CO2: 27 mEq/L (ref 19–32)
Calcium: 9.2 mg/dL (ref 8.4–10.5)
Chloride: 103 mEq/L (ref 96–112)
Creatinine, Ser: 0.68 mg/dL (ref 0.40–1.20)
GFR: 104.4 mL/min (ref >60.00)
Glucose, Bld: 85 mg/dL (ref 70–99)
Potassium: 4.1 mEq/L (ref 3.5–5.1)
Sodium: 139 mEq/L (ref 135–145)
Total Bilirubin: 0.3 mg/dL (ref 0.2–1.2)
Total Protein: 6.7 g/dL (ref 6.0–8.3)

## 2018-10-13 LAB — CBC WITH DIFFERENTIAL/PLATELET
Basophils Absolute: 0 10*3/uL (ref 0.0–0.1)
Basophils Relative: 0.7 % (ref 0.0–3.0)
Eosinophils Absolute: 0.1 10*3/uL (ref 0.0–0.7)
Eosinophils Relative: 1.3 % (ref 0.0–5.0)
HCT: 38.5 % (ref 36.0–46.0)
Hemoglobin: 12.5 g/dL (ref 12.0–15.0)
Lymphocytes Relative: 35.4 % (ref 12.0–46.0)
Lymphs Abs: 2.3 10*3/uL (ref 0.7–4.0)
MCHC: 32.5 g/dL (ref 30.0–36.0)
MCV: 81.9 fl (ref 78.0–100.0)
Monocytes Absolute: 0.5 10*3/uL (ref 0.1–1.0)
Monocytes Relative: 7.3 % (ref 3.0–12.0)
Neutro Abs: 3.5 10*3/uL (ref 1.4–7.7)
Neutrophils Relative %: 55.3 % (ref 43.0–77.0)
Platelets: 267 10*3/uL (ref 150.0–400.0)
RBC: 4.71 Mil/uL (ref 3.87–5.11)
RDW: 15.6 % — ABNORMAL HIGH (ref 11.5–15.5)
WBC: 6.4 10*3/uL (ref 4.0–10.5)

## 2018-10-13 LAB — LIPID PANEL
Cholesterol: 229 mg/dL — ABNORMAL HIGH (ref 0–200)
HDL: 50.6 mg/dL (ref >39.00)
NonHDL: 178.3
Total CHOL/HDL Ratio: 5
Triglycerides: 205 mg/dL — ABNORMAL HIGH (ref 0.0–149.0)
VLDL: 41 mg/dL — ABNORMAL HIGH (ref 0.0–40.0)

## 2018-10-13 LAB — TSH: TSH: 1.3 u[IU]/mL (ref 0.35–4.50)

## 2018-10-13 LAB — LDL CHOLESTEROL, DIRECT: Direct LDL: 153 mg/dL

## 2018-10-13 MED ORDER — OXYCODONE HCL 10 MG PO TABS: 10.0000 mg | ORAL_TABLET | Freq: Four times a day (QID) | ORAL | 0 refills | Status: DC

## 2018-10-14 MED ORDER — PHENTERMINE HCL 37.5 MG PO TABS: ORAL_TABLET | ORAL | 2 refills | Status: DC

## 2018-10-16 LAB — PAIN MGMT, PROFILE 8 W/CONF, U
6 Acetylmorphine: NEGATIVE ng/mL (ref <10)
Alcohol Metabolites: NEGATIVE ng/mL (ref <500)
Alphahydroxyalprazolam: NEGATIVE ng/mL (ref <25)
Alphahydroxymidazolam: NEGATIVE ng/mL (ref <50)
Alphahydroxytriazolam: NEGATIVE ng/mL (ref <50)
Aminoclonazepam: NEGATIVE ng/mL (ref <25)
Amphetamines: NEGATIVE ng/mL (ref <500)
Benzodiazepines: NEGATIVE ng/mL (ref <100)
Buprenorphine, Urine: NEGATIVE ng/mL (ref <5)
Cocaine Metabolite: NEGATIVE ng/mL (ref <150)
Codeine: NEGATIVE ng/mL (ref <50)
Creatinine: 115.3 mg/dL
Hydrocodone: NEGATIVE ng/mL (ref <50)
Hydromorphone: NEGATIVE ng/mL (ref <50)
Hydroxyethylflurazepam: NEGATIVE ng/mL (ref <50)
Lorazepam: NEGATIVE ng/mL (ref <50)
MDMA: NEGATIVE ng/mL (ref <500)
Marijuana Metabolite: NEGATIVE ng/mL (ref <20)
Morphine: NEGATIVE ng/mL (ref <50)
Nordiazepam: NEGATIVE ng/mL (ref <50)
Norhydrocodone: NEGATIVE ng/mL (ref <50)
Noroxycodone: 6454 ng/mL — ABNORMAL HIGH (ref <50)
Opiates: NEGATIVE ng/mL (ref <100)
Oxazepam: NEGATIVE ng/mL (ref <50)
Oxidant: NEGATIVE ug/mL (ref <200)
Oxycodone: 3435 ng/mL — ABNORMAL HIGH (ref <50)
Oxycodone: POSITIVE ng/mL — AB (ref <100)
Oxymorphone: 1104 ng/mL — ABNORMAL HIGH (ref <50)
Temazepam: NEGATIVE ng/mL (ref <50)
pH: 6.6 (ref 4.5–9.0)

## 2018-10-16 LAB — IRON,TIBC AND FERRITIN PANEL
%SAT: 17 % (calc) (ref 16–45)
Ferritin: 16 ng/mL (ref 16–154)
Iron: 65 ug/dL (ref 40–190)
TIBC: 377 mcg/dL (calc) (ref 250–450)

## 2018-11-05 ENCOUNTER — Other Ambulatory Visit: Payer: Self-pay | Admitting: Family Medicine

## 2018-11-05 ENCOUNTER — Telehealth: Payer: Self-pay | Admitting: Family Medicine

## 2018-11-05 DIAGNOSIS — M545 Low back pain: Principal | ICD-10-CM

## 2018-11-05 DIAGNOSIS — G8929 Other chronic pain: Secondary | ICD-10-CM

## 2018-11-05 NOTE — Telephone Encounter (Signed)
Please advise 

## 2018-11-05 NOTE — Telephone Encounter (Signed)
Copied from CRM [id number]. Topic: General - Other >> Nov 05, 2018  4:01 PM Tamela OddiHarris, Markeya Mincy J wrote: Reason for CRM: Patient called to inform the nurse or doctor that her script for the Oxycodone HCl 10 MG TABS has the wrong date on it.  The pharmacy said she should call the doctor to get it corrected before any refills can be ordered.  Please advise and call patient back at [phone]

## 2018-11-05 NOTE — Telephone Encounter (Signed)
See note

## 2018-11-05 NOTE — Telephone Encounter (Signed)
Can we fill?

## 2018-11-06 MED ORDER — OXYCODONE HCL 10 MG PO TABS: 10.0000 mg | ORAL_TABLET | Freq: Four times a day (QID) | ORAL | 0 refills | Status: DC

## 2018-11-06 NOTE — Telephone Encounter (Signed)
Completed. Sent new Rx today.

## 2018-11-06 NOTE — Telephone Encounter (Signed)
Patient notified of results and verbalized understanding.  

## 2018-11-11 ENCOUNTER — Encounter: Payer: Self-pay | Admitting: Family Medicine

## 2018-11-13 ENCOUNTER — Telehealth: Payer: Medicaid Other | Admitting: Family

## 2018-11-13 DIAGNOSIS — B9689 Other specified bacterial agents as the cause of diseases classified elsewhere: Secondary | ICD-10-CM

## 2018-11-13 DIAGNOSIS — J208 Acute bronchitis due to other specified organisms: Secondary | ICD-10-CM

## 2018-11-13 MED ORDER — BENZONATATE 100 MG PO CAPS: 100.0000 mg | ORAL_CAPSULE | Freq: Three times a day (TID) | ORAL | 0 refills | Status: DC | PRN

## 2018-11-13 MED ORDER — AZITHROMYCIN 250 MG PO TABS: ORAL_TABLET | ORAL | 0 refills | Status: DC

## 2018-11-13 MED ORDER — ALBUTEROL SULFATE HFA 108 (90 BASE) MCG/ACT IN AERS: 2.0000 | INHALATION_SPRAY | Freq: Four times a day (QID) | RESPIRATORY_TRACT | 0 refills | Status: DC | PRN

## 2018-11-13 NOTE — Progress Notes (Signed)
Greater than 5 minutes, yet less than 10 minutes of time have been spent researching, coordinating, and implementing care for this patient today.  Thank you for the details you included in the comment boxes. Those details are very helpful in determining the best course of treatment for you and help us to provide the best care.  We are sorry that you are not feeling well.  Here is how we plan to help!  Based on your presentation I believe you most likely have A cough due to bacteria.  When patients have a fever and a productive cough with a change in color or increased sputum production, we are concerned about bacterial bronchitis.  If left untreated it can progress to pneumonia.  If your symptoms do not improve with your treatment plan it is important that you contact your provider.   I have prescribed Azithromyin 250 mg: two tablets now and then one tablet daily for 4 additonal days    In addition you may use A non-prescription cough medication called Mucinex DM: take 2 tablets every 12 hours. and A prescription cough medication called Tessalon Perles 100mg . You may take 1-2 capsules every 8 hours as needed for your cough.  I have also added an Albuterol inhaler, take 2 puffs every 6 hours as needed for shortness of breath.    From your responses in the eVisit questionnaire you describe inflammation in the upper respiratory tract which is causing a significant cough.  This is commonly called Bronchitis and has four common causes:    Allergies  Viral Infections  Acid Reflux  Bacterial Infection Allergies, viruses and acid reflux are treated by controlling symptoms or eliminating the cause. An example might be a cough caused by taking certain blood pressure medications. You stop the cough by changing the medication. Another example might be a cough caused by acid reflux. Controlling the reflux helps control the cough.  USE OF BRONCHODILATOR ("RESCUE") INHALERS: There is a risk from using your  bronchodilator too frequently.  The risk is that over-reliance on a medication which only relaxes the muscles surrounding the breathing tubes can reduce the effectiveness of medications prescribed to reduce swelling and congestion of the tubes themselves.  Although you feel brief relief from the bronchodilator inhaler, your asthma may actually be worsening with the tubes becoming more swollen and filled with mucus.  This can delay other crucial treatments, such as oral steroid medications. If you need to use a bronchodilator inhaler daily, several times per day, you should discuss this with your provider.  There are probably better treatments that could be used to keep your asthma under control.     HOME CARE . Only take medications as instructed by your medical team. . Complete the entire course of an antibiotic. . Drink plenty of fluids and get plenty of rest. . Avoid close contacts especially the very young and the elderly . Cover your mouth if you cough or cough into your sleeve. . Always remember to wash your hands . A steam or ultrasonic humidifier can help congestion.   GET HELP RIGHT AWAY IF: . You develop worsening fever. . You become short of breath . You cough up blood. . Your symptoms persist after you have completed your treatment plan MAKE SURE YOU   Understand these instructions.  Will watch your condition.  Will get help right away if you are not doing well or get worse.  Your e-visit answers were reviewed by a board certified advanced clinical practitioner  to complete your personal care plan.  Depending on the condition, your plan could have included both over the counter or prescription medications. If there is a problem please reply  once you have received a response from your provider. Your safety is important to [place].  If you have drug allergies check your prescription carefully.    You can use MyChart to ask questions about today's visit, request a non-urgent call  back, or ask for a work or school excuse for 24 hours related to this e-Visit. If it has been greater than 24 hours you will need to follow up with your provider, or enter a new e-Visit to address those concerns. You will get an e-mail in the next two days asking about your experience.  I hope that your e-visit has been valuable and will speed your recovery. Thank you for using e-visits.

## 2018-12-16 ENCOUNTER — Encounter: Payer: Self-pay | Admitting: Family Medicine

## 2018-12-16 DIAGNOSIS — G8929 Other chronic pain: Secondary | ICD-10-CM

## 2018-12-16 DIAGNOSIS — M545 Low back pain, unspecified: Secondary | ICD-10-CM

## 2018-12-25 MED ORDER — OXYCODONE HCL 10 MG PO TABS: 10.0000 mg | ORAL_TABLET | Freq: Four times a day (QID) | ORAL | 0 refills | Status: DC

## 2018-12-25 NOTE — Telephone Encounter (Signed)
[facility] VISIT   Patient agreed to [facility] visit and is aware that copayment and coinsurance may apply. Patient was treated using telemedicine according to accepted telemedicine protocols.  Subjective:   Patient complains of CHRONIC PAIN.  Indication for chronic opioid: CHRONICLOW BACK, HIP, SHOULDER PAIN Medication and dose:10/325 QID # pills per month: 120 Last UDS date:10/13/2018  Pain contract signed (Y/N):Y Date narcotic database last reviewed (include red flags):10/13/2018 with no concerns  Per patient:  All medications are still the same and no concerns.  Patient Active Problem List   Diagnosis Date Noted  . Preterm premature rupture of membranes in third trimester 06/03/2018  . Long term current use of opiate analgesic 07/31/2016  . 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 Chronic shoulder pain (Location of Secondary source of pain) (Left) 07/31/2016  . Chronic lower extremity pain (Location of Tertiary source of pain) (Bilateral) (R>L) 07/31/2016  . Chronic low back pain (Location of Primary Source of Pain) (Bilateral) (R>L) 12/16/2015  . Supervision of normal pregnancy 08/25/2015  . Dysthymia 03/06/2015   Social History   Tobacco Use  . Smoking status: Never Smoker  . Smokeless tobacco: Never Used  Substance Use Topics  . Alcohol use: No    Current Outpatient Medications:  .  albuterol (PROVENTIL HFA;VENTOLIN HFA) 108 (90 Base) MCG/ACT inhaler, Inhale 2 puffs into the lungs every 6 (six) hours as needed for wheezing or shortness of breath., Disp: 1 Inhaler, Rfl: 0 .  [START ON 02/20/2019] Oxycodone HCl 10 MG TABS, Take 1 tablet (10 mg total) by mouth 4 (four) times daily., Disp: 120 tablet, Rfl: 0 .  [START ON 01/23/2019] Oxycodone HCl 10 MG TABS, Take 1 tablet (10 mg total) by mouth 4 (four) times daily., Disp: 120 tablet, Rfl: 0 .  Oxycodone HCl 10 MG TABS, Take 1 tablet (10 mg total) by mouth 4 (four) times daily., Disp: 120 tablet, Rfl: 0 .  phentermine  (ADIPEX-P) 37.5 MG tablet, Take 1/2 to 1 tab q am, Disp: 30 tablet, Rfl: 2 .  sertraline (ZOLOFT) 100 MG tablet, TAKE 2 TABLETS BY MOUTH EVERY DAY, Disp: 180 tablet, Rfl: 2  Allergies  Allergen Reactions  . Penicillins Anaphylaxis  . Sulfa Antibiotics Rash    Assessment and Plan:   Diagnosis: CHRONIC PAIN SYNDROME AND MANAGEMENT. Please see myChart communication and orders below.   Meds ordered this encounter  Medications  . Oxycodone HCl 10 MG TABS    Sig: Take 1 tablet (10 mg total) by mouth 4 (four) times daily.    Dispense:  120 tablet    Refill:  0  . Oxycodone HCl 10 MG TABS    Sig: Take 1 tablet (10 mg total) by mouth 4 (four) times daily.    Dispense:  120 tablet    Refill:  0  . Oxycodone HCl 10 MG TABS    Sig: Take 1 tablet (10 mg total) by mouth 4 (four) times daily.    Dispense:  120 tablet    Refill:  0    Helane Rima, DO 12/25/2018  A total of 10 minutes were spent by me to personally review the patient-generated inquiry, review patient records and data pertinent to assessment of the patient's problem, develop a management plan including generation of prescriptions and/or orders, and on subsequent communication with the patient through secure the MyChart portal service.   There is no separately reported E/M service related to this service in the past 7 days nor does the patient have  an upcoming soonest available appointment for this issue. This work was completed in less than 7 days.   The patient consented to this service today (see patient agreement prior to ongoing communication). Patient counseled regarding the need for in-person exam for certain conditions and was advised to call the office if any changing or worsening symptoms occur.   The codes to be used for the E/M service are: [x]   99421 for 5-10 minutes of time spent on the inquiry. []   99422 for 11-20 minutes. []   [id number] for 21+ minutes.

## 2018-12-28 ENCOUNTER — Encounter: Payer: Self-pay | Admitting: Family Medicine

## 2018-12-28 DIAGNOSIS — B029 Zoster without complications: Secondary | ICD-10-CM

## 2018-12-29 ENCOUNTER — Other Ambulatory Visit: Payer: Self-pay

## 2018-12-29 ENCOUNTER — Telehealth (INDEPENDENT_AMBULATORY_CARE_PROVIDER_SITE_OTHER): Payer: Medicaid Other | Admitting: Family Medicine

## 2018-12-29 DIAGNOSIS — B029 Zoster without complications: Secondary | ICD-10-CM

## 2018-12-29 MED ORDER — VALACYCLOVIR HCL 1 G PO TABS: 1000.0000 mg | ORAL_TABLET | Freq: Three times a day (TID) | ORAL | 0 refills | Status: DC

## 2018-12-29 MED ORDER — LIDOCAINE 4 % EX PTCH: 1.0000 | MEDICATED_PATCH | Freq: Two times a day (BID) | CUTANEOUS | 2 refills | Status: DC

## 2018-12-29 NOTE — Progress Notes (Signed)
Virtual Visit via Video   I connected with Rachel [name] on 12/29/18 at  3:20 PM EDT by a video enabled telemedicine application and verified that I am speaking with the correct person using two identifiers. Location patient: Home Location provider: [facility], Office Persons participating in the virtual visit: [place] as CMA scribe, Helane Rima, DO   I discussed the limitations of evaluation and management by telemedicine and the availability of in person appointments. The patient expressed understanding and agreed to proceed.  Subjective:   HPI: Patient c/o pain in right lower groin that radiates to c-section scar and to right side of back. No rash or lesion. No Hx of the same. Happened acutely, noted after sleep.  ROS: See pertinent positives and negatives per HPI.  Patient Active Problem List   Diagnosis Date Noted  . Preterm premature rupture of membranes in third trimester 06/03/2018  . Long term current use of opiate analgesic 07/31/2016  . 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 Chronic shoulder pain (Location of Secondary source of pain) (Left) 07/31/2016  . Chronic lower extremity pain (Location of Tertiary source of pain) (Bilateral) (R>L) 07/31/2016  . Chronic low back pain (Location of Primary Source of Pain) (Bilateral) (R>L) 12/16/2015  . Supervision of normal pregnancy 08/25/2015  . Dysthymia 03/06/2015    Social History   Tobacco Use  . Smoking status: Never Smoker  . Smokeless tobacco: Never Used  Substance Use Topics  . Alcohol use: No   Current Outpatient Medications:  .  albuterol (PROVENTIL HFA;VENTOLIN HFA) 108 (90 Base) MCG/ACT inhaler, Inhale 2 puffs into the lungs every 6 (six) hours as needed for wheezing or shortness of breath., Disp: 1 Inhaler, Rfl: 0 .  [START ON 02/20/2019] Oxycodone HCl 10 MG TABS, Take 1 tablet (10 mg total) by mouth 4 (four) times daily., Disp: 120 tablet, Rfl: 0 .  [START  ON 01/23/2019] Oxycodone HCl 10 MG TABS, Take 1 tablet (10 mg total) by mouth 4 (four) times daily., Disp: 120 tablet, Rfl: 0 .  Oxycodone HCl 10 MG TABS, Take 1 tablet (10 mg total) by mouth 4 (four) times daily., Disp: 120 tablet, Rfl: 0 .  phentermine (ADIPEX-P) 37.5 MG tablet, Take 1/2 to 1 tab q am, Disp: 30 tablet, Rfl: 2 .  sertraline (ZOLOFT) 100 MG tablet, TAKE 2 TABLETS BY MOUTH EVERY DAY, Disp: 180 tablet, Rfl: 2  Allergies  Allergen Reactions  . Penicillins Anaphylaxis  . Sulfa Antibiotics Rash    Objective:   VITALS: Per patient if applicable. GENERAL: Alert, appears well and in no acute distress. HEENT: Atraumatic, conjunctiva clear, no obvious abnormalities on inspection of external nose and ears. NECK: Normal movements of the head and neck. CARDIOPULMONARY: No increased WOB. Speaking in clear sentences. I:E ratio WNL.  MS: Moves all visible extremities without noticeable abnormality. PSYCH: Pleasant and cooperative, well-groomed. Speech normal rate and rhythm. Affect is appropriate. Insight and judgement are appropriate. Attention is focused, linear, and appropriate.  NEURO: CN grossly intact. Oriented as arrived to appointment on time with no prompting. Moves both UE equally.  SKIN: No obvious lesions, wounds, erythema, or cyanosis noted on face or hands.  Assessment and Plan:   Diagnoses and all orders for this visit:  Herpes zoster without complication -     valACYclovir (VALTREX) 1000 MG tablet; Take 1 tablet (1,000 mg total) by mouth 3 (three) times daily for 7 days. -     Lidocaine 4 %  PTCH; Apply 1 patch topically 2 times daily at 12 noon and 4 pm.    . Reviewed expectations re: course of current medical issues. . Discussed self-management of symptoms. . Outlined signs and symptoms indicating need for more acute intervention. . Patient verbalized understanding and all questions were answered. [name] Health Maintenance issues including appropriate healthy diet,  exercise, and smoking avoidance were discussed with patient. . See orders for this visit as documented in the electronic medical record.  Helane Rima, DO 12/29/2018  Time spent with the patient: 25 minutes, of which >50% was spent in obtaining information about her symptoms, reviewing her previous labs, evaluations, and treatments, counseling her about her condition (please see the discussed topics above), and developing a plan to further investigate it; she had a number of questions which I addressed.

## 2018-12-30 ENCOUNTER — Ambulatory Visit: Payer: Medicaid Other | Admitting: Family Medicine

## 2018-12-30 MED ORDER — OXYCODONE-ACETAMINOPHEN 5-325 MG PO TABS: 1.0000 | ORAL_TABLET | Freq: Four times a day (QID) | ORAL | 0 refills | Status: DC | PRN

## 2018-12-30 MED ORDER — GABAPENTIN 100 MG PO CAPS: ORAL_CAPSULE | ORAL | 3 refills | Status: DC

## 2019-01-12 ENCOUNTER — Ambulatory Visit: Payer: Medicaid Other | Admitting: Family Medicine

## 2019-01-25 ENCOUNTER — Encounter: Payer: Self-pay | Admitting: Family Medicine

## 2019-01-30 ENCOUNTER — Other Ambulatory Visit: Payer: Self-pay | Admitting: Family Medicine

## 2019-01-30 DIAGNOSIS — G8929 Other chronic pain: Secondary | ICD-10-CM

## 2019-01-30 DIAGNOSIS — M545 Low back pain, unspecified: Secondary | ICD-10-CM

## 2019-02-02 MED ORDER — OXYCODONE HCL 10 MG PO TABS: 10.0000 mg | ORAL_TABLET | Freq: Four times a day (QID) | ORAL | 0 refills | Status: DC

## 2019-03-30 ENCOUNTER — Encounter: Payer: Self-pay | Admitting: Family Medicine

## 2019-03-30 NOTE — Telephone Encounter (Signed)
App moved up and changed in office patient will come in early and get xray. If has to bring son she will let me know so they can come in side door.

## 2019-04-01 ENCOUNTER — Encounter: Payer: Self-pay | Admitting: Family Medicine

## 2019-04-01 ENCOUNTER — Other Ambulatory Visit: Payer: Self-pay | Admitting: Family Medicine

## 2019-04-01 ENCOUNTER — Ambulatory Visit: Payer: Medicaid Other | Admitting: Family Medicine

## 2019-04-01 ENCOUNTER — Telehealth: Payer: Self-pay | Admitting: Family Medicine

## 2019-04-01 DIAGNOSIS — G8929 Other chronic pain: Secondary | ICD-10-CM

## 2019-04-01 DIAGNOSIS — M545 Low back pain, unspecified: Secondary | ICD-10-CM

## 2019-04-01 MED ORDER — OXYCODONE HCL 10 MG PO TABS: 10.0000 mg | ORAL_TABLET | Freq: Four times a day (QID) | ORAL | 0 refills | Status: DC

## 2019-04-01 NOTE — Telephone Encounter (Signed)
Pt needs refill on oxyCODONE-acetaminophen (PERCOCET/ROXICET) 5-325 MG tablet. Pts appt was cancelled for today. Please advise.

## 2019-04-01 NOTE — Telephone Encounter (Signed)
Refill sent to pharmacy.   

## 2019-04-01 NOTE — Telephone Encounter (Signed)
Last OV: 10/13/18 Next OV: none scheduled at this time: PMP website checked: last filled on 03/01/19

## 2019-04-01 NOTE — Telephone Encounter (Signed)
We have moved app for f/u but patient needs refill she is seeing another provider for foot pain

## 2019-04-02 ENCOUNTER — Ambulatory Visit: Payer: Medicaid Other | Admitting: Family Medicine

## 2019-04-02 NOTE — Telephone Encounter (Signed)
See open message pt has been called and reviewed information

## 2019-04-02 NOTE — Telephone Encounter (Signed)
See other message. Rachel [name] spoke to the patient on 04/02/19.

## 2019-04-03 ENCOUNTER — Ambulatory Visit (INDEPENDENT_AMBULATORY_CARE_PROVIDER_SITE_OTHER): Payer: Medicaid Other | Admitting: Family Medicine

## 2019-04-03 ENCOUNTER — Other Ambulatory Visit: Payer: Self-pay

## 2019-04-03 ENCOUNTER — Ambulatory Visit: Payer: Medicaid Other | Admitting: Family Medicine

## 2019-04-03 ENCOUNTER — Encounter: Payer: Self-pay | Admitting: Family Medicine

## 2019-04-03 VITALS — Ht 65.0 in | Wt 195.0 lb

## 2019-04-03 DIAGNOSIS — M79604 Pain in right leg: Secondary | ICD-10-CM

## 2019-04-03 DIAGNOSIS — M545 Low back pain, unspecified: Secondary | ICD-10-CM

## 2019-04-03 DIAGNOSIS — M79605 Pain in left leg: Secondary | ICD-10-CM

## 2019-04-03 DIAGNOSIS — Z79891 Long term (current) use of opiate analgesic: Secondary | ICD-10-CM

## 2019-04-03 DIAGNOSIS — G8929 Other chronic pain: Secondary | ICD-10-CM

## 2019-04-03 DIAGNOSIS — M25559 Pain in unspecified hip: Secondary | ICD-10-CM

## 2019-04-03 DIAGNOSIS — M79671 Pain in right foot: Secondary | ICD-10-CM

## 2019-04-03 DIAGNOSIS — M25512 Pain in left shoulder: Secondary | ICD-10-CM

## 2019-04-03 MED ORDER — OXYCODONE HCL 10 MG PO TABS: 10.0000 mg | ORAL_TABLET | Freq: Four times a day (QID) | ORAL | 0 refills | Status: DC

## 2019-04-03 MED ORDER — DOXYCYCLINE HYCLATE 100 MG PO TABS: 100.0000 mg | ORAL_TABLET | Freq: Two times a day (BID) | ORAL | 0 refills | Status: DC

## 2019-04-03 NOTE — Progress Notes (Signed)
Virtual Visit via Video   Due to the COVID-19 pandemic, this visit was completed with telemedicine (audio/video) technology to reduce patient and provider exposure as well as to preserve personal protective equipment.   I connected with Rachel [name] by a video enabled telemedicine application and verified that I am speaking with the correct person using two identifiers. Location patient: Home Location provider: [facility], Office Persons participating in the virtual visit: Rachel [name], Nayan Proch, DO   I discussed the limitations of evaluation and management by telemedicine and the availability of in person appointments. The patient expressed understanding and agreed to proceed.  Care Team   Patient Care Team: Helane RimaWallace, Baldo Hufnagle, DO as PCP - General (Family Medicine)  Subjective:   HPI:   Foot pain x 2 weeks, daily pain that worsens toward the end of the day, becomes red and swollen. Pain is right foot, dorsum, with radiation to toes, and with decreased flex/extention of toes and forefoot[facility] [place] [facility], wonders if dock ladder caused injury. Walk-in clinic xray and labs, including uric acid WNL. No streaking, open lesions, rash, or other red flags. Prednisone prescribed, but not helping. No Hx of gout. No ETOH, diet changes. Patient well-known to me, chronic pain management for years, pleasant and reliable. Has young children, so active.  Pain Medication Refill Medication is being used for the ongoing treatment of:  1. Chronic shoulder pain (Location of Secondary source of pain) (Left)   2. Chronic hip pain (Bilateral) (R>L)   3. Chronic lower extremity pain (Location of Tertiary source of pain) (Bilateral) (R>L)   4. Chronic low back pain (Location of Primary Source of Pain) (Bilateral) (R>L)    Inappropriate side effects (including oversedation/altered mental status)? no Contract in place? yes [place] prescription drug database  checked? yes Are non-opiod therapies optimized? yes  Review of Systems  Constitutional: Negative for chills and fever.  HENT: Negative for hearing loss and tinnitus.   Eyes: Negative for blurred vision and double vision.  Respiratory: Negative for cough and wheezing.   Cardiovascular: Negative for chest pain, palpitations and leg swelling.  Gastrointestinal: Negative for nausea and vomiting.  Genitourinary: Negative for dysuria and urgency.  Skin: Negative for rash.  Neurological: Negative for dizziness and headaches.  Psychiatric/Behavioral: Negative for depression and suicidal ideas.    Patient Active Problem List   Diagnosis Date Noted  . Preterm premature rupture of membranes in third trimester 06/03/2018  . Long term current use of opiate analgesic 07/31/2016  . 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 Chronic shoulder pain (Location of Secondary source of pain) (Left) 07/31/2016  . Chronic lower extremity pain (Location of Tertiary source of pain) (Bilateral) (R>L) 07/31/2016  . Chronic low back pain (Location of Primary Source of Pain) (Bilateral) (R>L) 12/16/2015  . Supervision of normal pregnancy 08/25/2015  . Dysthymia 03/06/2015    Social History   Tobacco Use  . Smoking status: Never Smoker  . Smokeless tobacco: Never Used  Substance Use Topics  . Alcohol use: No   Current Outpatient Medications:  .  gabapentin (NEURONTIN) 100 MG capsule, 1-2 PO TID PRN SHINGLES PAIN., Disp: 60 capsule, Rfl: 3 .  Lidocaine 4 % PTCH, Apply 1 patch topically 2 times daily at 12 noon and 4 pm., Disp: 12 patch, Rfl: 2 .  Oxycodone HCl 10 MG TABS, Take 1 tablet (10 mg total) by mouth 4 (four) times daily., Disp: 120 tablet, Rfl: 0 .  Oxycodone  HCl 10 MG TABS, Take 1 tablet (10 mg total) by mouth 4 (four) times daily., Disp: 120 tablet, Rfl: 0 .  Oxycodone HCl 10 MG TABS, Take 1 tablet (10 mg total) by mouth 4 (four) times daily., Disp: 120 tablet, Rfl: 0 .  phentermine  (ADIPEX-P) 37.5 MG tablet, Take 1/2 to 1 tab q am, Disp: 30 tablet, Rfl: 2 .  sertraline (ZOLOFT) 100 MG tablet, TAKE 2 TABLETS BY MOUTH EVERY DAY, Disp: 180 tablet, Rfl: 2  Allergies  Allergen Reactions  . Penicillins Anaphylaxis  . Sulfa Antibiotics Rash   Objective:   VITALS: Per patient if applicable, see vitals. GENERAL: Alert and in no acute distress. CARDIOPULMONARY: No increased WOB. Speaking in clear sentences.  PSYCH: Pleasant and cooperative. Speech normal rate and rhythm. Affect is appropriate. Insight and judgement are appropriate. Attention is focused, linear, and appropriate.  NEURO: Oriented as arrived to appointment on time with no prompting.   Depression screen [facility] 2/9 03/21/2018 01/21/2018 07/31/2016  Decreased Interest 0 0 0  Down, Depressed, Hopeless 0 0 0  PHQ - 2 Score 0 0 0  Altered sleeping 0 0 -  Tired, decreased energy 0 0 -  Change in appetite 0 0 -  Feeling bad or failure about yourself  0 0 -  Trouble concentrating 0 0 -  Moving slowly or fidgety/restless 0 0 -  Suicidal thoughts 0 0 -  PHQ-9 Score 0 0 -  Difficult doing work/chores Not difficult at all Not difficult at all -   Assessment and Plan:   Rachel [name] was seen today for medication refill and foot pain.  Diagnoses and all orders for this visit:  Right foot pain Comments: Agree that gout high on DDx, but no improvement. Video visit, so difficult to evaluate. Rx Abx for safety and will get to Podiatry ASAP for in-office eval. Orders: -     doxycycline (VIBRA-TABS) 100 MG tablet; Take 1 tablet (100 mg total) by mouth 2 (two) times daily. -     Ambulatory referral to [place] term current use of opiate analgesic  Chronic shoulder pain (Location of Secondary source of pain) (Left) -     Oxycodone HCl 10 MG TABS; Take 1 tablet (10 mg total) by mouth 4 (four) times daily. -     Oxycodone HCl 10 MG TABS; Take 1 tablet (10 mg total) by mouth 4 (four) times daily.  Chronic hip pain  (Bilateral) (R>L) -     Oxycodone HCl 10 MG TABS; Take 1 tablet (10 mg total) by mouth 4 (four) times daily. -     Oxycodone HCl 10 MG TABS; Take 1 tablet (10 mg total) by mouth 4 (four) times daily.  Chronic lower extremity pain (Location of Tertiary source of pain) (Bilateral) (R>L) -     Oxycodone HCl 10 MG TABS; Take 1 tablet (10 mg total) by mouth 4 (four) times daily. -     Oxycodone HCl 10 MG TABS; Take 1 tablet (10 mg total) by mouth 4 (four) times daily.  Chronic low back pain (Location of Primary Source of Pain) (Bilateral) (R>L) -     Oxycodone HCl 10 MG TABS; Take 1 tablet (10 mg total) by mouth 4 (four) times daily. -     Oxycodone HCl 10 MG TABS; Take 1 tablet (10 mg total) by mouth 4 (four) times daily.   [name] COVID-19 Education: The signs and symptoms of COVID-19 were discussed with the patient and how to seek care  for testing if needed. The importance of social distancing was discussed today. . Reviewed expectations re: course of current medical issues. . Discussed self-management of symptoms. . Outlined signs and symptoms indicating need for more acute intervention. . Patient verbalized understanding and all questions were answered. [name]. Health Maintenance issues including appropriate healthy diet, exercise, and smoking avoidance were discussed with patient. . See orders for this visit as documented in the electronic medical record.  Helane RimaErica Titan Karner, DO  Records requested if needed. Time spent: 25 minutes, of which >50% was spent in obtaining information about her symptoms, reviewing her previous labs, evaluations, and treatments, counseling her about her condition (please see the discussed topics above), and developing a plan to further investigate it; she had a number of questions which I addressed.

## 2019-04-05 ENCOUNTER — Encounter: Payer: Self-pay | Admitting: Family Medicine

## 2019-04-05 MED ORDER — OXYCODONE HCL 10 MG PO TABS: 10.0000 mg | ORAL_TABLET | Freq: Four times a day (QID) | ORAL | 0 refills | Status: DC

## 2019-04-07 ENCOUNTER — Ambulatory Visit: Payer: Self-pay | Admitting: Sports Medicine

## 2019-05-25 ENCOUNTER — Other Ambulatory Visit: Payer: Self-pay | Admitting: Family Medicine

## 2019-05-25 DIAGNOSIS — F341 Dysthymic disorder: Secondary | ICD-10-CM

## 2019-05-26 NOTE — Telephone Encounter (Signed)
Last fill 09/03/18  #180/2 Last OV 04/03/19 Next OV 07/03/19

## 2019-06-17 ENCOUNTER — Other Ambulatory Visit: Payer: Self-pay | Admitting: Family Medicine

## 2019-06-17 DIAGNOSIS — M25512 Pain in left shoulder: Secondary | ICD-10-CM

## 2019-06-17 DIAGNOSIS — M545 Low back pain, unspecified: Secondary | ICD-10-CM

## 2019-06-17 DIAGNOSIS — G8929 Other chronic pain: Secondary | ICD-10-CM

## 2019-06-17 DIAGNOSIS — M79604 Pain in right leg: Secondary | ICD-10-CM

## 2019-06-18 NOTE — Telephone Encounter (Signed)
Rx request 

## 2019-06-19 MED ORDER — OXYCODONE HCL 10 MG PO TABS: 10.0000 mg | ORAL_TABLET | Freq: Four times a day (QID) | ORAL | 0 refills | Status: DC

## 2019-06-26 ENCOUNTER — Ambulatory Visit: Payer: Medicaid Other | Admitting: Family Medicine

## 2019-07-02 ENCOUNTER — Encounter: Payer: Self-pay | Admitting: Family Medicine

## 2019-07-02 NOTE — Telephone Encounter (Signed)
Printed for provider

## 2019-07-03 ENCOUNTER — Ambulatory Visit: Payer: Medicaid Other | Admitting: Family Medicine

## 2019-07-05 NOTE — Progress Notes (Deleted)
Virtual Visit via Video   Due to the COVID-19 pandemic, this visit was completed with telemedicine (audio/video) technology to reduce patient and provider exposure as well as to preserve personal protective equipment.   I connected with Rachel [name] by a video enabled telemedicine application and verified that I am speaking with the correct person using two identifiers. Location patient: Home Location provider: [facility], Office Persons participating in the virtual visit: Rachel [name], Thi Sisemore, DO   I discussed the limitations of evaluation and management by telemedicine and the availability of in person appointments. The patient expressed understanding and agreed to proceed.  Care Team   Patient Care Team: Helane Rima, DO as PCP - General (Family Medicine)  Subjective:   HPI:   ROS   Patient Active Problem List   Diagnosis Date Noted  . Preterm premature rupture of membranes in third trimester 06/03/2018  . Long term current use of opiate analgesic 07/31/2016  . 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 Chronic shoulder pain (Location of Secondary source of pain) (Left) 07/31/2016  . Chronic lower extremity pain (Location of Tertiary source of pain) (Bilateral) (R>L) 07/31/2016  . Chronic low back pain (Location of Primary Source of Pain) (Bilateral) (R>L) 12/16/2015  . Supervision of normal pregnancy 08/25/2015  . Dysthymia 03/06/2015    Social History   Tobacco Use  . Smoking status: Never Smoker  . Smokeless tobacco: Never Used  Substance Use Topics  . Alcohol use: No    Current Outpatient Medications:  .  doxycycline (VIBRA-TABS) 100 MG tablet, Take 1 tablet (100 mg total) by mouth 2 (two) times daily., Disp: 20 tablet, Rfl: 0 .  Oxycodone HCl 10 MG TABS, Take 1 tablet (10 mg total) by mouth 4 (four) times daily., Disp: 120 tablet, Rfl: 0 .  Oxycodone HCl 10 MG TABS, Take 1 tablet (10 mg total) by mouth 4 (four) times  daily., Disp: 120 tablet, Rfl: 0 .  Oxycodone HCl 10 MG TABS, Take 1 tablet (10 mg total) by mouth 4 (four) times daily., Disp: 120 tablet, Rfl: 0 .  phentermine (ADIPEX-P) 37.5 MG tablet, Take 1/2 to 1 tab q am, Disp: 30 tablet, Rfl: 2 .  sertraline (ZOLOFT) 100 MG tablet, TAKE 2 TABLETS BY MOUTH EVERY DAY, Disp: 180 tablet, Rfl: 2  Allergies  Allergen Reactions  . Penicillins Anaphylaxis  . Sulfa Antibiotics Rash    Objective:   VITALS: Per patient if applicable, see vitals. GENERAL: Alert, appears well and in no acute distress. HEENT: Atraumatic, conjunctiva clear, no obvious abnormalities on inspection of external nose and ears. NECK: Normal movements of the head and neck. CARDIOPULMONARY: No increased WOB. Speaking in clear sentences. I:E ratio WNL.  MS: Moves all visible extremities without noticeable abnormality. PSYCH: Pleasant and cooperative, well-groomed. Speech normal rate and rhythm. Affect is appropriate. Insight and judgement are appropriate. Attention is focused, linear, and appropriate.  NEURO: CN grossly intact. Oriented as arrived to appointment on time with no prompting. Moves both UE equally.  SKIN: No obvious lesions, wounds, erythema, or cyanosis noted on face or hands.  Depression screen [facility] 2/9 03/21/2018 01/21/2018 07/31/2016  Decreased Interest 0 0 0  Down, Depressed, Hopeless 0 0 0  PHQ - 2 Score 0 0 0  Altered sleeping 0 0 -  Tired, decreased energy 0 0 -  Change in appetite 0 0 -  Feeling bad or failure about yourself  0 0 -  Trouble concentrating 0 0 -  Moving slowly or fidgety/restless 0 0 -  Suicidal thoughts 0 0 -  PHQ-9 Score 0 0 -  Difficult doing work/chores Not difficult at all Not difficult at all -    Assessment and Plan:   There are no diagnoses linked to this encounter.  [name] COVID-19 Education: The signs and symptoms of COVID-19 were discussed with the patient and how to seek care for testing if needed. The importance of social  distancing was discussed today. . Reviewed expectations re: course of current medical issues. . Discussed self-management of symptoms. . Outlined signs and symptoms indicating need for more acute intervention. . Patient verbalized understanding and all questions were answered. [name] Health Maintenance issues including appropriate healthy diet, exercise, and smoking avoidance were discussed with patient. . See orders for this visit as documented in the electronic medical record.  Briscoe Deutscher, DO  Records requested if needed. Time spent: *** minutes, of which >50% was spent in obtaining information about her symptoms, reviewing her previous labs, evaluations, and treatments, counseling her about her condition (please see the discussed topics above), and developing a plan to further investigate it; she had a number of questions which I addressed.

## 2019-07-06 ENCOUNTER — Ambulatory Visit: Payer: Medicaid Other | Admitting: Family Medicine

## 2019-07-08 ENCOUNTER — Encounter: Payer: Self-pay | Admitting: Family Medicine

## 2019-07-09 ENCOUNTER — Encounter: Payer: Self-pay | Admitting: Family Medicine

## 2019-07-09 ENCOUNTER — Other Ambulatory Visit: Payer: Self-pay

## 2019-07-09 ENCOUNTER — Ambulatory Visit (INDEPENDENT_AMBULATORY_CARE_PROVIDER_SITE_OTHER): Payer: Medicaid Other | Admitting: Family Medicine

## 2019-07-09 DIAGNOSIS — M79605 Pain in left leg: Secondary | ICD-10-CM

## 2019-07-09 DIAGNOSIS — G8929 Other chronic pain: Secondary | ICD-10-CM

## 2019-07-09 DIAGNOSIS — M79604 Pain in right leg: Secondary | ICD-10-CM

## 2019-07-09 DIAGNOSIS — M545 Low back pain: Secondary | ICD-10-CM

## 2019-07-09 DIAGNOSIS — M25559 Pain in unspecified hip: Secondary | ICD-10-CM

## 2019-07-09 DIAGNOSIS — M25512 Pain in left shoulder: Secondary | ICD-10-CM

## 2019-07-09 MED ORDER — OXYCODONE HCL 10 MG PO TABS: 10.0000 mg | ORAL_TABLET | Freq: Four times a day (QID) | ORAL | 0 refills | Status: DC

## 2019-07-09 NOTE — Progress Notes (Signed)
Virtual Visit via Video   Due to the COVID-19 pandemic, this visit was completed with telemedicine (audio/video) technology to reduce patient and provider exposure as well as to preserve personal protective equipment.   I connected with [name] by a video enabled telemedicine application and verified that I am speaking with the correct person using two identifiers. Location patient: Home Location provider:  [facility], Office Persons participating in the virtual visit: [place], DO Barnie Mort, CMA acting as scribe for Dr. Helane Rima.   I discussed the limitations of evaluation and management by telemedicine and the availability of in person appointments. The patient expressed understanding and agreed to proceed.  Care Team   Patient Care Team: Helane Rima, DO as PCP - General (Family Medicine)  Subjective:   HPI:   Medication refill:  Medication is being used for the ongoing treatment of: back and shoulder pain  Inappropriate side effects (including oversedation/altered mental status)? None  Patient is currently on Oxycodone 10mg  1 tab four times a day. She will need refills today.  Database reviewed. No concerns. Using other methods for pain control, including stretching, breathing techniques, walking.  Review of Systems  Constitutional: Negative for chills and fever.  HENT: Negative for hearing loss and tinnitus.   Eyes: Negative for blurred vision and double vision.  Respiratory: Negative for cough and wheezing.   Cardiovascular: Negative for chest pain, palpitations and leg swelling.  Gastrointestinal: Negative for nausea and vomiting.  Genitourinary: Negative for dysuria and urgency.  Neurological: Negative for dizziness and headaches.  Psychiatric/Behavioral: Negative for depression and suicidal ideas.     Patient Active Problem List   Diagnosis Date Noted  . Preterm premature rupture of membranes in third  trimester 06/03/2018  . History of macrosomia in infant in prior pregnancy, currently pregnant 02/06/2018  . Supervision of high risk pregnancy in third trimester 01/06/2018  . Long term current use of opiate analgesic 07/31/2016  . 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 Chronic shoulder pain (Location of Secondary source of pain) (Left) 07/31/2016  . Chronic lower extremity pain (Location of Tertiary source of pain) (Bilateral) (R>L) 07/31/2016  . Chronic low back pain (Location of Primary Source of Pain) (Bilateral) (R>L) 12/16/2015  . Dysthymia 03/06/2015    Social History   Tobacco Use  . Smoking status: Never Smoker  . Smokeless tobacco: Never Used  Substance Use Topics  . Alcohol use: No    Current Outpatient Medications:  .  [START ON 09/12/2019] Oxycodone HCl 10 MG TABS, Take 1 tablet (10 mg total) by mouth 4 (four) times daily., Disp: 120 tablet, Rfl: 0 .  [START ON 08/15/2019] Oxycodone HCl 10 MG TABS, Take 1 tablet (10 mg total) by mouth 4 (four) times daily., Disp: 120 tablet, Rfl: 0 .  [START ON 07/18/2019] Oxycodone HCl 10 MG TABS, Take 1 tablet (10 mg total) by mouth 4 (four) times daily., Disp: 120 tablet, Rfl: 0 .  sertraline (ZOLOFT) 100 MG tablet, TAKE 2 TABLETS BY MOUTH EVERY DAY, Disp: 180 tablet, Rfl: 2  Allergies  Allergen Reactions  . Penicillins Anaphylaxis  . Sulfa Antibiotics Rash    Objective:   VITALS: Per patient if applicable, see vitals. GENERAL: Alert, appears well and in no acute distress. HEENT: Atraumatic, conjunctiva clear, no obvious abnormalities on inspection of external nose and ears. NECK: Normal movements of the head and neck. CARDIOPULMONARY: No increased WOB. Speaking in clear sentences. I:E ratio WNL.  MS: Moves  all visible extremities without noticeable abnormality. PSYCH: Pleasant and cooperative, well-groomed. Speech normal rate and rhythm. Affect is appropriate. Insight and judgement are appropriate. Attention is  focused, linear, and appropriate.  NEURO: CN grossly intact. Oriented as arrived to appointment on time with no prompting. Moves both UE equally.  SKIN: No obvious lesions, wounds, erythema, or cyanosis noted on face or hands.  Depression screen [facility] 2/9 07/09/2019 03/21/2018 01/21/2018  Decreased Interest 0 0 0  Down, Depressed, Hopeless 0 0 0  PHQ - 2 Score 0 0 0  Altered sleeping 0 0 0  Tired, decreased energy 0 0 0  Change in appetite 0 0 0  Feeling bad or failure about yourself  0 0 0  Trouble concentrating 0 0 0  Moving slowly or fidgety/restless 0 0 0  Suicidal thoughts 0 0 0  PHQ-9 Score 0 0 0  Difficult doing work/chores Not difficult at all Not difficult at all Not difficult at all    Assessment and Plan:   [place] was seen today for follow-up.  Diagnoses and all orders for this visit:  Chronic low back pain (Location of Primary Source of Pain) (Bilateral) (R>L) Chronic shoulder pain (Location of Secondary source of pain) (Left) Chronic hip pain (Bilateral) (R>L) Chronic low back pain (Location of Primary Source of Pain) (Bilateral) (R>L) Chronic lower extremity pain (Location of Tertiary source of pain) (Bilateral) (R>L) -     Oxycodone HCl 10 MG TABS; Take 1 tablet (10 mg total) by mouth 4 (four) times daily. -     Oxycodone HCl 10 MG TABS; Take 1 tablet (10 mg total) by mouth 4 (four) times daily. -     Oxycodone HCl 10 MG TABS; Take 1 tablet (10 mg total) by mouth 4 (four) times daily.  [name] COVID-19 Education: The signs and symptoms of COVID-19 were discussed with the patient and how to seek care for testing if needed. The importance of social distancing was discussed today. . Reviewed expectations re: course of current medical issues. . Discussed self-management of symptoms. . Outlined signs and symptoms indicating need for more acute intervention. . Patient verbalized understanding and all questions were answered. [name] Health Maintenance issues including appropriate  healthy diet, exercise, and smoking avoidance were discussed with patient. . See orders for this visit as documented in the electronic medical record.  Briscoe Deutscher, DO

## 2019-07-16 ENCOUNTER — Encounter: Payer: Self-pay | Admitting: Family Medicine

## 2019-07-21 MED ORDER — OXYCODONE-ACETAMINOPHEN 10-325 MG PO TABS: 1.0000 | ORAL_TABLET | Freq: Four times a day (QID) | ORAL | 0 refills | Status: AC

## 2019-07-28 ENCOUNTER — Other Ambulatory Visit: Payer: Self-pay | Admitting: Family Medicine

## 2019-07-28 DIAGNOSIS — B029 Zoster without complications: Secondary | ICD-10-CM

## 2019-09-14 ENCOUNTER — Encounter (INDEPENDENT_AMBULATORY_CARE_PROVIDER_SITE_OTHER): Payer: Self-pay | Admitting: Family Medicine

## 2019-09-14 NOTE — Telephone Encounter (Signed)
Please review

## 2019-09-15 NOTE — Telephone Encounter (Signed)
Called pt to schedule, no answer, LVM.  °

## 2019-10-08 ENCOUNTER — Encounter: Payer: Self-pay | Admitting: Family Medicine

## 2019-10-08 ENCOUNTER — Ambulatory Visit (INDEPENDENT_AMBULATORY_CARE_PROVIDER_SITE_OTHER): Payer: Medicaid Other | Admitting: Family Medicine

## 2019-10-08 VITALS — Ht 65.0 in | Wt 195.0 lb

## 2019-10-08 DIAGNOSIS — G8921 Chronic pain due to trauma: Secondary | ICD-10-CM

## 2019-10-08 DIAGNOSIS — F339 Major depressive disorder, recurrent, unspecified: Secondary | ICD-10-CM
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
## 2019-10-10 ENCOUNTER — Encounter: Payer: Self-pay | Admitting: Family Medicine

## 2019-10-15 ENCOUNTER — Other Ambulatory Visit: Payer: Self-pay | Admitting: Family Medicine

## 2019-10-15 MED ORDER — OXYCODONE-ACETAMINOPHEN 10-325 MG PO TABS: 1.0000 | ORAL_TABLET | Freq: Four times a day (QID) | ORAL | 0 refills | Status: DC | PRN

## 2019-10-15 NOTE — Telephone Encounter (Signed)
Last OV: 10/08/19 (doxy) Next OV: 11/11/19 PMP website checked: last filled on 09/14/19 for qty #120.

## 2019-10-30 ENCOUNTER — Encounter: Payer: Self-pay | Admitting: Family Medicine

## 2019-11-11 ENCOUNTER — Other Ambulatory Visit: Payer: Self-pay

## 2019-11-11 ENCOUNTER — Encounter: Payer: Self-pay | Admitting: Family Medicine

## 2019-11-11 ENCOUNTER — Ambulatory Visit (INDEPENDENT_AMBULATORY_CARE_PROVIDER_SITE_OTHER): Payer: Self-pay | Admitting: Family Medicine

## 2019-11-11 VITALS — BP 110/70 | HR 92 | Temp 97.2°F | Ht 65.0 in | Wt 193.6 lb

## 2019-11-11 DIAGNOSIS — Z0000 Encounter for general adult medical examination without abnormal findings: Secondary | ICD-10-CM

## 2019-11-11 DIAGNOSIS — R202 Paresthesia of skin: Secondary | ICD-10-CM

## 2019-11-11 DIAGNOSIS — F1920 Other psychoactive substance dependence, uncomplicated: Secondary | ICD-10-CM

## 2019-11-11 DIAGNOSIS — F341 Dysthymic disorder: Secondary | ICD-10-CM

## 2019-11-11 LAB — LIPID PANEL
Cholesterol: 160 mg/dL (ref 0–200)
HDL: 42 mg/dL (ref >39.00)
LDL Cholesterol: 93 mg/dL (ref 0–99)
NonHDL: 117.91
Total CHOL/HDL Ratio: 4
Triglycerides: 124 mg/dL (ref 0.0–149.0)
VLDL: 24.8 mg/dL (ref 0.0–40.0)

## 2019-11-11 LAB — COMPREHENSIVE METABOLIC PANEL
ALT: 8 U/L (ref 0–35)
AST: 9 U/L (ref 0–37)
Albumin: 4.2 g/dL (ref 3.5–5.2)
Alkaline Phosphatase: 79 U/L (ref 39–117)
BUN: 8 mg/dL (ref 6–23)
CO2: 28 mEq/L (ref 19–32)
Calcium: 9.1 mg/dL (ref 8.4–10.5)
Chloride: 103 mEq/L (ref 96–112)
Creatinine, Ser: 0.67 mg/dL (ref 0.40–1.20)
GFR: 99.32 mL/min (ref >60.00)
Glucose, Bld: 80 mg/dL (ref 70–99)
Potassium: 3.9 mEq/L (ref 3.5–5.1)
Sodium: 139 mEq/L (ref 135–145)
Total Bilirubin: 0.3 mg/dL (ref 0.2–1.2)
Total Protein: 6.4 g/dL (ref 6.0–8.3)

## 2019-11-11 LAB — VITAMIN D 25 HYDROXY (VIT D DEFICIENCY, FRACTURES): VITD: <7 ng/mL — ABNORMAL LOW (ref 30.00–100.00)

## 2019-11-11 LAB — CBC WITH DIFFERENTIAL/PLATELET
Basophils Absolute: 0 10*3/uL (ref 0.0–0.1)
Basophils Relative: 0.5 % (ref 0.0–3.0)
Eosinophils Absolute: 0.1 10*3/uL (ref 0.0–0.7)
Eosinophils Relative: 0.9 % (ref 0.0–5.0)
HCT: 38.6 % (ref 36.0–46.0)
Hemoglobin: 12.7 g/dL (ref 12.0–15.0)
Lymphocytes Relative: 37.8 % (ref 12.0–46.0)
Lymphs Abs: 2.5 10*3/uL (ref 0.7–4.0)
MCHC: 33 g/dL (ref 30.0–36.0)
MCV: 83.6 fl (ref 78.0–100.0)
Monocytes Absolute: 0.5 10*3/uL (ref 0.1–1.0)
Monocytes Relative: 6.9 % (ref 3.0–12.0)
Neutro Abs: 3.6 10*3/uL (ref 1.4–7.7)
Neutrophils Relative %: 53.9 % (ref 43.0–77.0)
Platelets: 230 10*3/uL (ref 150.0–400.0)
RBC: 4.62 Mil/uL (ref 3.87–5.11)
RDW: 14.6 % (ref 11.5–15.5)
WBC: 6.7 10*3/uL (ref 4.0–10.5)

## 2019-11-11 LAB — TSH: TSH: 1.97 u[IU]/mL (ref 0.35–4.50)

## 2019-11-11 LAB — VITAMIN B12: Vitamin B-12: 200 pg/mL — ABNORMAL LOW (ref 211–911)

## 2019-11-11 MED ORDER — SERTRALINE HCL 100 MG PO TABS: 200.0000 mg | ORAL_TABLET | Freq: Every day | ORAL | 2 refills | Status: DC

## 2019-11-11 MED ORDER — OXYCODONE HCL 10 MG PO TABS: 10.0000 mg | ORAL_TABLET | Freq: Four times a day (QID) | ORAL | 0 refills | Status: DC | PRN

## 2019-11-11 NOTE — Patient Instructions (Addendum)
Nerve irritation/inflammation of left arm likely from holding baby and overusing/position.   -try over the counter voltaren gel on wrist and elbow/arm up to four times a day.  -use a thumb spica wrist splint at night. Can order off [facility] or get at [employer].   -checking all labs. Let me know if this stuff is not getting better! -urine drug screen for narcotics.   -call medicaid and get your new card so we can get you into pain management.   Preventive Care 41-86 Years Old, Female Preventive care refers to visits with your health care provider and lifestyle choices that can promote health and wellness. This includes:  A yearly physical exam. This may also be called an annual well check.  Regular dental visits and eye exams.  Immunizations.  Screening for certain conditions.  Healthy lifestyle choices, such as eating a healthy diet, getting regular exercise, not using drugs or products that contain nicotine and tobacco, and limiting alcohol use. What can I expect for my preventive care visit? Physical exam Your health care provider will check your:  Height and weight. This may be used to calculate body mass index (BMI), which tells if you are at a healthy weight.  Heart rate and blood pressure.  Skin for abnormal spots. Counseling Your health care provider may ask you questions about your:  Alcohol, tobacco, and drug use.  Emotional well-being.  Home and relationship well-being.  Sexual activity.  Eating habits.  Work and work Statistician.  Method of birth control.  Menstrual cycle.  Pregnancy history. What immunizations do I need?  Influenza (flu) vaccine  This is recommended every year. Tetanus, diphtheria, and pertussis (Tdap) vaccine  You may need a Td booster every 10 years. Varicella (chickenpox) vaccine  You may need this if you have not been vaccinated. Human papillomavirus (HPV) vaccine  If recommended by your health care provider, you  may need three doses over 6 months. Measles, mumps, and rubella (MMR) vaccine  You may need at least one dose of MMR. You may also need a second dose. Meningococcal conjugate (MenACWY) vaccine  One dose is recommended if you are age 48-21 years and a first-year college student living in a residence hall, or if you have one of several medical conditions. You may also need additional booster doses. Pneumococcal conjugate (PCV13) vaccine  You may need this if you have certain conditions and were not previously vaccinated. Pneumococcal polysaccharide (PPSV23) vaccine  You may need one or two doses if you smoke cigarettes or if you have certain conditions. Hepatitis A vaccine  You may need this if you have certain conditions or if you travel or work in places where you may be exposed to hepatitis A. Hepatitis B vaccine  You may need this if you have certain conditions or if you travel or work in places where you may be exposed to hepatitis B. Haemophilus influenzae type b (Hib) vaccine  You may need this if you have certain conditions. You may receive vaccines as individual doses or as more than one vaccine together in one shot (combination vaccines). Talk with your health care provider about the risks and benefits of combination vaccines. What tests do I need?  Blood tests  Lipid and cholesterol levels. These may be checked every 5 years starting at age 8.  Hepatitis C test.  Hepatitis B test. Screening  Diabetes screening. This is done by checking your blood sugar (glucose) after you have not eaten for a while (fasting).  Sexually  transmitted disease (STD) testing.  BRCA-related cancer screening. This may be done if you have a family history of breast, ovarian, tubal, or peritoneal cancers.  Pelvic exam and Pap test. This may be done every 3 years starting at age 54. Starting at age 55, this may be done every 5 years if you have a Pap test in combination with an HPV test. Talk  with your health care provider about your test results, treatment options, and if necessary, the need for more tests. Follow these instructions at home: Eating and drinking   Eat a diet that includes fresh fruits and vegetables, whole grains, lean protein, and low-fat dairy.  Take vitamin and mineral supplements as recommended by your health care provider.  Do not drink alcohol if: ? Your health care provider tells you not to drink. ? You are pregnant, may be pregnant, or are planning to become pregnant.  If you drink alcohol: ? Limit how much you have to 0-1 drink a day. ? Be aware of how much alcohol is in your drink. In the [place] one drink equals one 12 oz bottle of beer (355 mL), one 5 oz glass of wine (148 mL), or one 1 oz glass of hard liquor (44 mL). Lifestyle  Take daily care of your teeth and gums.  Stay active. Exercise for at least 30 minutes on 5 or more days each week.  Do not use any products that contain nicotine or tobacco, such as cigarettes, e-cigarettes, and chewing tobacco. If you need help quitting, ask your health care provider.  If you are sexually active, practice safe sex. Use a condom or other form of birth control (contraception) in order to prevent pregnancy and STIs (sexually transmitted infections). If you plan to become pregnant, see your health care provider for a preconception visit. What's next?  Visit your health care provider once a year for a well check visit.  Ask your health care provider how often you should have your eyes and teeth checked.  Stay up to date on all vaccines. This information is not intended to replace advice given to you by your health care provider. Make sure you discuss any questions you have with your health care provider. Document Revised: 06/05/2018 Document Reviewed: 06/05/2018 Elsevier Patient Education  2020 [employer].
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
## 2019-11-11 NOTE — Progress Notes (Deleted)
Patient: Rachel [name] MRN: [id number] DOB: 1980-11-01 PCP: Orland Mustard, MD     Subjective:  No chief complaint on file.   HPI: The patient is a 39 y.o. female who presents today for ***  Review of Systems  Constitutional: Negative for activity change, chills and fatigue.  HENT: Negative for ear pain, sinus pain and tinnitus.   Eyes: Positive for visual disturbance. Negative for pain and redness.       Blurry vision at night.  Respiratory: Negative for cough, choking and shortness of breath.   Cardiovascular: Negative for palpitations.  Gastrointestinal: Negative for diarrhea, nausea and vomiting.  Endocrine: Negative for heat intolerance.  Genitourinary: Negative for flank pain, frequency and pelvic pain.  Musculoskeletal: Negative for back pain, joint swelling and neck pain.  Skin: Negative for pallor, rash and wound.  Allergic/Immunologic: Negative for environmental allergies and food allergies.  Neurological: Positive for numbness. Negative for syncope and weakness.  Psychiatric/Behavioral: Positive for suicidal ideas. Negative for behavioral problems. The patient is nervous/anxious.     Allergies Patient is allergic to penicillins and sulfa antibiotics.  Past Medical History Patient  has a past medical history of Allergy, Anxiety, Back pain, Depression, Fever blister (02/12/2016), and Migraines.  Surgical History Patient  has a past surgical history that includes Ganglion cyst excision (Right, wrist); Cesarean section (N/A, 08/26/2015); Ganglion cyst excision; and Cesarean section with bilateral tubal ligation (06/06/2018).  Family History [name]'s family history includes Heart disease in her father and mother.  Social History Patient  reports that she has never smoked. She has never used smokeless tobacco. She reports that she does not drink alcohol or use drugs.    Objective: Vitals:   11/11/19 0835  BP: 110/70  Pulse: 92  Temp: (!) 97.2 F (36.2 C)   SpO2: 96%  Weight: 193 lb 9.6 oz (87.8 kg)  Height: 5\' 5"  (1.651 m)    Body mass index is 32.22 kg/m.  Physical Exam     Assessment/plan:      No follow-ups on file.     @[facility] @ 11/11/2019

## 2019-11-13 ENCOUNTER — Other Ambulatory Visit: Payer: Self-pay | Admitting: Family Medicine

## 2019-11-13 ENCOUNTER — Encounter: Payer: Self-pay | Admitting: Family Medicine

## 2019-11-13 DIAGNOSIS — E538 Deficiency of other specified B group vitamins: Secondary | ICD-10-CM | POA: Insufficient documentation

## 2019-11-13 DIAGNOSIS — E559 Vitamin D deficiency, unspecified: Secondary | ICD-10-CM | POA: Insufficient documentation

## 2019-11-13 LAB — PAIN MGMT, PROFILE 8 W/CONF, U
6 Acetylmorphine: NEGATIVE ng/mL
Alcohol Metabolites: NEGATIVE ng/mL (ref <500)
Amphetamines: NEGATIVE ng/mL
Benzodiazepines: NEGATIVE ng/mL
Buprenorphine, Urine: NEGATIVE ng/mL
Cocaine Metabolite: NEGATIVE ng/mL
Codeine: NEGATIVE ng/mL
Creatinine: 150.1 mg/dL
Hydrocodone: NEGATIVE ng/mL
Hydromorphone: NEGATIVE ng/mL
MDMA: NEGATIVE ng/mL
Marijuana Metabolite: NEGATIVE ng/mL
Morphine: NEGATIVE ng/mL
Norhydrocodone: NEGATIVE ng/mL
Noroxycodone: >10000 ng/mL
Opiates: NEGATIVE ng/mL
Oxidant: NEGATIVE ug/mL
Oxycodone: >10000 ng/mL
Oxycodone: POSITIVE ng/mL
Oxymorphone: 3352 ng/mL
pH: 5.5 (ref 4.5–9.0)

## 2019-11-13 MED ORDER — VITAMIN D (ERGOCALCIFEROL) 1.25 MG (50000 UNIT) PO CAPS: ORAL_CAPSULE | ORAL | 0 refills | Status: AC

## 2019-12-08 ENCOUNTER — Telehealth: Payer: Self-pay

## 2019-12-08 NOTE — Telephone Encounter (Signed)
Notated. 

## 2019-12-08 NOTE — Telephone Encounter (Signed)
Patient calling to switch her appt to virtual appt for tomorrow

## 2019-12-08 NOTE — Telephone Encounter (Signed)
LVM to speak with pt about current symptoms.

## 2019-12-09 ENCOUNTER — Ambulatory Visit (INDEPENDENT_AMBULATORY_CARE_PROVIDER_SITE_OTHER): Payer: Medicaid Other | Admitting: Family Medicine

## 2019-12-09 ENCOUNTER — Encounter: Payer: Self-pay | Admitting: Family Medicine

## 2019-12-09 VITALS — Ht 65.0 in | Wt 193.0 lb

## 2019-12-09 DIAGNOSIS — R269 Unspecified abnormalities of gait and mobility: Secondary | ICD-10-CM

## 2019-12-09 DIAGNOSIS — E538 Deficiency of other specified B group vitamins: Secondary | ICD-10-CM

## 2019-12-09 DIAGNOSIS — L659 Nonscarring hair loss, unspecified: Secondary | ICD-10-CM

## 2019-12-09 DIAGNOSIS — R202 Paresthesia of skin: Secondary | ICD-10-CM

## 2019-12-09 MED ORDER — OXYCODONE HCL 10 MG PO TABS: 10.0000 mg | ORAL_TABLET | Freq: Four times a day (QID) | ORAL | 0 refills | Status: DC | PRN

## 2019-12-09 MED ORDER — CYANOCOBALAMIN 1000 MCG/ML IJ SOLN: INTRAMUSCULAR | 1 refills | Status: DC

## 2019-12-09 NOTE — Progress Notes (Signed)
Patient: Rachel [name] MRN: [id number] DOB: 1981-02-07 PCP: Orland Mustard, MD     I connected with [name] on 12/09/19 at 1:12pm by a video enabled telemedicine application and verified that I am speaking with the correct person using two identifiers.  Location patient: Home Location provider:  [facility], Office Persons participating in this virtual visit: Rachel [name] and Dr. Artis Flock   I discussed the limitations of evaluation and management by telemedicine and the availability of in person appointments. The patient expressed understanding and agreed to proceed.   Subjective:  Chief Complaint  Patient presents with  . Tingling    Left side; Feels like pens and needles.  . Dizziness    Pt says that she is swimmy headed. And she hasn't felt good.  . Alopecia    Gradually for a month.     HPI: The patient is a 39 y.o. female who presents today for multiple complaints. I saw her a month ago and she had complaints of left sided tingling but it only happened 1-2x. She states this has progressively gotten more frequent and will last up to one minute. It goes away on it's own. It's only in her fingers and toes. She states positions don't matter. She can be walking/standing/sitting. It is happening about 2-3x/day. No weakness, has not dropped anything. No excessive fatigue.   She also has more hair loss and has a bald patch now in the back of her head. She is still having lots of hair coming out as well. Her youngest baby is 34.19 years old. Clumps are coming out. Labs were checked last month and were normal. (tsh).   She is also feeling off balance. She states she is not getting with positional changes, she states she just feels off balanced like she is on a cruise ship. At times she feels like she has to sit down. She has no focal deficits, no weakness. She states her vision can be "funky." she also had one episode of double vision. She hasn't had her eyes checked.  She is not more tired than normal.    Review of Systems  Constitutional: Positive for fatigue. Negative for chills, fever and unexpected weight change.  Eyes: Positive for visual disturbance.  Respiratory: Negative for cough, shortness of breath and wheezing.   Cardiovascular: Negative for chest pain, palpitations and leg swelling.  Skin:       Hair loss, small bald patch in back   Neurological: Positive for light-headedness. Negative for dizziness, facial asymmetry, speech difficulty, weakness and numbness.       Tingling on left side in hands/feet   Psychiatric/Behavioral: Positive for decreased concentration. Negative for behavioral problems, dysphoric mood and suicidal ideas. The patient is not nervous/anxious.    No fh of MS in first degree relative. Does have an aunt with MS   Allergies Patient is allergic to penicillins and sulfa antibiotics.  Past Medical History Patient  has a past medical history of Allergy, Anxiety, Back pain, Depression, Fever blister (02/12/2016), and Migraines.  Surgical History Patient  has a past surgical history that includes Ganglion cyst excision (Right, wrist); Cesarean section (N/A, 08/26/2015); Ganglion cyst excision; and Cesarean section with bilateral tubal ligation (06/06/2018).  Family History [name]'s family history includes Heart disease in her father and mother.  Social History Patient  reports that she has never smoked. She has never used smokeless tobacco. She reports that she does not drink alcohol or use drugs.    Objective: Vitals:  12/09/19 1305  Weight: 193 lb (87.5 kg)  Height: 5\' 5"  (1.651 m)    Body mass index is 32.12 kg/m.  Physical Exam Vitals reviewed.  Constitutional:      General: She is not in acute distress.    Appearance: Normal appearance. She is obese. She is not ill-appearing.  HENT:     Head: Normocephalic and atraumatic.  Pulmonary:     Effort: Pulmonary effort is normal.  Neurological:      General: No focal deficit present.     Mental Status: She is alert and oriented to person, place, and time.     Cranial Nerves: No cranial nerve deficit.     Comments: Normal diadochokinesis of fingers   Psychiatric:        Mood and Affect: Mood normal.        Behavior: Behavior normal.        Assessment/plan: 1. B12 deficiency A lot of her symptoms may all be due to B12 deficiency. Will add on more b12 deficiency labs and start her on [place] replacements. She was not able to start this last month as she lives 40 minutes from our clinic. Will send medication to pharmacy and nurse will show her how to do this to herself and will do daily x 7 days then weekly x 4 weeks then monthly. Will recheck in 1-2 months.  - Intrinsic Factor Antibodies; Future - Methylmalonic acid, serum; Future - Homocysteine; Future - Folate; Future  2. Tingling in extremities Likely secondary to #1, but does have some things that warrant MRI of brain to rule out MS or other neurologic issue. Start B12 replacement, MRI brain, check ANA.  - ANA, IFA Comprehensive Panel; Future - MR Brain W Wo Contrast; Future - Basic metabolic panel; Future  3. Gait disturbance  - MR Brain W Wo Contrast; Future  4. Patchy loss of hair Does have a small bald spot in back. Checking ANA to make sure of no lupus, replacing b12 and telogen effluvium is still on differential as well.     Return if symptoms worsen or fail to improve.  Records requested if needed. Time spent with patient: 35 minutes, of which >50% was spent in obtaining information about her symptoms, reviweing her previous labs, evaluations, and treatments, counseling her about her conditions (please see discussed topics above), and developing a plan to further investigate it; she had a number of questions which I addressed.    Orma Flaming, MD [place]  12/09/2019

## 2019-12-10 ENCOUNTER — Telehealth: Payer: Self-pay | Admitting: Family Medicine

## 2019-12-10 NOTE — Telephone Encounter (Signed)
Pt is scheduled and aware of appt date/time/self pay.   There is a chance she will have insurance prior to the appt - i told her if that happens, to call me asap so I can get a prior auth so insurance can be filed.  She voiced understanding.

## 2019-12-20 ENCOUNTER — Ambulatory Visit
Admission: RE | Admit: 2019-12-20 | Discharge: 2019-12-20 | Disposition: A | Discharge status: Home / Self Care | Payer: Medicaid Other | Source: Ambulatory Visit | Attending: Family Medicine | Admitting: Family Medicine

## 2019-12-20 ENCOUNTER — Other Ambulatory Visit: Payer: Self-pay

## 2019-12-20 DIAGNOSIS — R202 Paresthesia of skin: Secondary | ICD-10-CM | POA: Insufficient documentation

## 2019-12-20 DIAGNOSIS — R269 Unspecified abnormalities of gait and mobility: Secondary | ICD-10-CM | POA: Insufficient documentation

## 2019-12-20 MED ORDER — GADOBUTROL 1 MMOL/ML IV SOLN
8.0000 mL | Freq: Once | INTRAVENOUS | Status: AC | PRN
  Administered 2019-12-20: 8 mL via INTRAVENOUS

## 2020-01-05 ENCOUNTER — Other Ambulatory Visit: Payer: Self-pay | Admitting: Family Medicine

## 2020-01-05 NOTE — Telephone Encounter (Signed)
LOV:  12/09/2019 No upcoming visits  Approve?

## 2020-01-06 ENCOUNTER — Encounter: Payer: Self-pay | Admitting: Family Medicine

## 2020-01-06 ENCOUNTER — Other Ambulatory Visit: Payer: Self-pay | Admitting: Family Medicine

## 2020-01-06 MED ORDER — OXYCODONE HCL 10 MG PO TABS: 10.0000 mg | ORAL_TABLET | Freq: Four times a day (QID) | ORAL | 0 refills | Status: DC | PRN

## 2020-01-06 NOTE — Telephone Encounter (Signed)
LAST APPOINTMENT DATE: 12/09/2019 NEXT APPOINTMENT DATE:@Visit  date not found  Rx Oxycodone HCI 10mg  LAST REFILL:   QTY:#125 0Rf

## 2020-01-19 ENCOUNTER — Ambulatory Visit (INDEPENDENT_AMBULATORY_CARE_PROVIDER_SITE_OTHER)
Admission: RE | Admit: 2020-01-19 | Discharge: 2020-01-19 | Disposition: A | Discharge status: Home / Self Care | Payer: Self-pay | Source: Ambulatory Visit

## 2020-01-19 ENCOUNTER — Other Ambulatory Visit: Payer: Self-pay

## 2020-01-19 DIAGNOSIS — Z20822 Contact with and (suspected) exposure to covid-19: Secondary | ICD-10-CM

## 2020-01-19 DIAGNOSIS — J069 Acute upper respiratory infection, unspecified: Secondary | ICD-10-CM

## 2020-01-19 MED ORDER — PREDNISONE 20 MG PO TABS: 20.0000 mg | ORAL_TABLET | Freq: Two times a day (BID) | ORAL | 0 refills | Status: AC

## 2020-01-19 MED ORDER — BENZONATATE 100 MG PO CAPS: 100.0000 mg | ORAL_CAPSULE | Freq: Three times a day (TID) | ORAL | 0 refills | Status: DC

## 2020-01-19 NOTE — Discharge Instructions (Signed)
COVID testing ordered.  You may text "COVID" to [phone], call [phone], OR  log on to [email or web address] to make an appointment  In the meantime: You should remain isolated in your home for 10 days from symptom onset AND greater than 72 hours after symptoms resolution (absence of fever without the use of fever-reducing medication and improvement in respiratory symptoms), whichever is longer Get plenty of rest and push fluids You may use OTC zyrtec for nasal congestion, runny nose, and/or sore throat You may use OTC flonase as needed for nasal congestion and runny nose Use medications daily for symptom relief Tessalon and prednisone prescribed.  Take as directed and to completion Use OTC medications like ibuprofen or tylenol as needed fever or pain Call or go to the ED if you have any new or worsening symptoms such as fever, cough, shortness of breath, chest tightness, chest pain, turning blue, changes in mental status, etc..[name]

## 2020-01-19 NOTE — ED Provider Notes (Signed)
[facility] Virtual Visit via Video Note:  Rachel [name]  initiated request for Telemedicine visit with [facility] team. I connected with [name]  on 01/19/2020 at 11:14 AM  for a synchronized telemedicine visit using a video enabled HIPPA compliant telemedicine application. I verified that I am speaking with Rachel [name]  using two identifiers. Rennis Harding, PA-C  was physically located in a [facility] site and Rachel [name] was located at a different location.   The limitations of evaluation and management by telemedicine as well as the availability of in-person appointments were discussed. Patient was informed that she  may incur a bill ( including co-pay) for this virtual visit encounter. Rachel [name]  expressed understanding and gave verbal consent to proceed with virtual visit.   [id number] 01/19/20 Arrival Time: 1105  CC: URI symptoms   SUBJECTIVE: History from: patient.  Rachel [name] is a 39 y.o. female who presents with runny nose, congestion, "deep" cough and body aches x 3 days.  Denies sick exposure or precipitating event.  Denies exposure to COVID.  Has tried flonase without relief.  Symptoms are made worse at night.  Reports previous symptoms in the past with allergies.   Complains of fatigue. Denies fever, chills, sore throat, SOB, wheezing, chest pain, nausea, vomiting, changes in bowel or bladder habits.    Denies smoking.  Reports hx of asthma in early 20's.    ROS: As per HPI.  All other pertinent ROS negative.     Past Medical History:  Diagnosis Date  . Allergy   . Anxiety   . Back pain   . Depression   . Fever blister 02/12/2016  . Migraines    Past Surgical History:  Procedure Laterality Date  . CESAREAN SECTION N/A 08/26/2015   Procedure: CESAREAN SECTION;  Surgeon: Hildred Laser, MD;  Location: [facility] ORS;  Service: Obstetrics;  Laterality: N/A;  . CESAREAN  SECTION WITH BILATERAL TUBAL LIGATION  06/06/2018   Procedure: CESAREAN SECTION WITH BILATERAL TUBAL LIGATION;  Surgeon: Ward, Elenora Fender, MD;  Location: [facility] ORS;  Service: Obstetrics;;  . GANGLION CYST EXCISION Right wrist  . GANGLION CYST EXCISION     right hand 07/2014   Allergies  Allergen Reactions  . Penicillins Anaphylaxis  . Sulfa Antibiotics Rash   No current facility-administered medications on file prior to encounter.   Current Outpatient Medications on File Prior to Encounter  Medication Sig Dispense Refill  . cyanocobalamin (,VITAMIN B-12,) 1000 MCG/ML injection [place] daily x 7 days then weekly x 4 weeks. 10 mL 1  . Oxycodone HCl 10 MG TABS Take 1 tablet (10 mg total) by mouth 4 (four) times daily as needed. 120 tablet 0  . sertraline (ZOLOFT) 100 MG tablet Take 2 tablets (200 mg total) by mouth daily. 180 tablet 2  . Vitamin D, Ergocalciferol, (DRISDOL) 1.25 MG (50000 UNIT) CAPS capsule One capsule by mouth once a week for 12 weeks. Then take 2000IU/day 12 capsule 0    OBJECTIVE:   There were no vitals filed for this visit.  General appearance: alert, appears fatigued, but nontoxic; no distress Eyes: EOMI grossly HENT: normocephalic; atraumatic Neck: supple with FROM Lungs: normal respiratory effort; speaking in full sentences without difficulty Extremities: moves extremities without difficulty Skin: No obvious rashes Neurologic: No facial asymmetries Psychological: alert and cooperative; normal mood and affect  ASSESSMENT & PLAN:  1. Viral URI with cough   2. Suspected COVID-19  virus infection     Meds ordered this encounter  Medications  . predniSONE (DELTASONE) 20 MG tablet    Sig: Take 1 tablet (20 mg total) by mouth 2 (two) times daily with a meal for 5 days.    Dispense:  10 tablet    Refill:  0    Order Specific Question:   Supervising Provider    Answer:   Raylene Everts [[id number]]  . benzonatate (TESSALON) 100 MG capsule    Sig: Take 1  capsule (100 mg total) by mouth every 8 (eight) hours.    Dispense:  21 capsule    Refill:  0    Order Specific Question:   Supervising Provider    Answer:   Raylene Everts [[id number]]   COVID testing ordered.  You may text "COVID" to [phone], call [phone], OR  log on to [email or web address] to make an appointment  In the meantime: You should remain isolated in your home for 10 days from symptom onset AND greater than 72 hours after symptoms resolution (absence of fever without the use of fever-reducing medication and improvement in respiratory symptoms), whichever is longer Get plenty of rest and push fluids You may use OTC zyrtec for nasal congestion, runny nose, and/or sore throat You may use OTC flonase as needed for nasal congestion and runny nose Use medications daily for symptom relief Tessalon and prednisone prescribed.  Take as directed and to completion Use OTC medications like ibuprofen or tylenol as needed fever or pain Call or go to the ED if you have any new or worsening symptoms such as fever, cough, shortness of breath, chest tightness, chest pain, turning blue, changes in mental status, etc...   I discussed the assessment and treatment plan with the patient. The patient was provided an opportunity to ask questions and all were answered. The patient agreed with the plan and demonstrated an understanding of the instructions.   The patient was advised to call back or seek an in-person evaluation if the symptoms worsen or if the condition fails to improve as anticipated.  I provided 7 minutes of non-face-to-face time during this encounter.  [place], PA-C  01/19/2020 11:14 AM          Lestine Box, PA-C 01/19/20 1116

## 2020-02-06 ENCOUNTER — Other Ambulatory Visit: Payer: Self-pay | Admitting: Family Medicine

## 2020-02-08 ENCOUNTER — Encounter: Payer: Self-pay | Admitting: Family Medicine

## 2020-02-08 NOTE — Telephone Encounter (Signed)
Called pt to remind her that she has a referral with Pain Management, and that she can call them to schedule an appointment.  Pt says that she went through the marketplace for insurance coverage. So she is waiting to hear back from the request.

## 2020-02-08 NOTE — Telephone Encounter (Signed)
[place] Database Verified LR: 01-06-2020 Qty: 120 Last office visit: 12-09-2019 Upcoming appointment: No pending appt

## 2020-02-08 NOTE — Telephone Encounter (Signed)
Please touch base with patient. She was establishing care at a pain management office last month and they should be taking over prescription.   Thanks,  Dr. Artis Flock

## 2020-02-09 NOTE — Telephone Encounter (Signed)
You're very welcome! Have a great day as well!

## 2020-02-10 MED ORDER — OXYCODONE HCL 10 MG PO TABS: 10.0000 mg | ORAL_TABLET | Freq: Four times a day (QID) | ORAL | 0 refills | Status: DC | PRN

## 2020-03-08 ENCOUNTER — Other Ambulatory Visit: Payer: Self-pay | Admitting: Family Medicine

## 2020-03-08 ENCOUNTER — Encounter: Payer: Self-pay | Admitting: Family Medicine

## 2020-03-09 MED ORDER — OXYCODONE HCL 10 MG PO TABS: 10.0000 mg | ORAL_TABLET | Freq: Four times a day (QID) | ORAL | 0 refills | Status: DC | PRN

## 2020-03-29 ENCOUNTER — Other Ambulatory Visit: Payer: Self-pay | Admitting: Family Medicine

## 2020-03-29 DIAGNOSIS — F341 Dysthymic disorder: Secondary | ICD-10-CM

## 2020-04-06 ENCOUNTER — Ambulatory Visit (INDEPENDENT_AMBULATORY_CARE_PROVIDER_SITE_OTHER): Payer: Self-pay | Admitting: Family Medicine

## 2020-04-06 ENCOUNTER — Other Ambulatory Visit: Payer: Self-pay

## 2020-04-06 ENCOUNTER — Encounter: Payer: Self-pay | Admitting: Family Medicine

## 2020-04-06 VITALS — BP 98/60 | HR 104 | Temp 98.0°F | Ht 65.0 in | Wt 192.8 lb

## 2020-04-06 DIAGNOSIS — G8929 Other chronic pain: Secondary | ICD-10-CM

## 2020-04-06 DIAGNOSIS — M545 Low back pain: Secondary | ICD-10-CM

## 2020-04-06 DIAGNOSIS — E538 Deficiency of other specified B group vitamins: Secondary | ICD-10-CM

## 2020-04-06 MED ORDER — OXYCODONE HCL 10 MG PO TABS: 10.0000 mg | ORAL_TABLET | Freq: Four times a day (QID) | ORAL | 0 refills | Status: DC | PRN

## 2020-04-06 NOTE — Patient Instructions (Signed)
Now that you are done with the b12 injections, lets check level today and then you can start oral b12/daily.   Call around to pain management and see if they will take self pay as well.   Dr. Artis Flock

## 2020-04-06 NOTE — Progress Notes (Signed)
Patient: Rachel [name] MRN: [id number] DOB: 21-May-1981 PCP: Orland Mustard, MD     Subjective:  Chief Complaint  Patient presents with  . Back Pain  . Shoulder Pain    HPI: The patient is a 39 y.o. female who presents today for shoulder pain and back pain. She is requesting a refill for Oxycodone. I have placed a referral to pain management back in 09/2019 and she states she wants to self pay, but they are not letting her do this. She is still on medicaid for family planning and is trying to get insurance and she is trying to to get medicaid to deny her so she can then get her insurance.   b12 deficiency: due for recheck. finished injections.   Review of Systems  Constitutional: Negative for fever.  Respiratory: Negative for cough and shortness of breath.   Cardiovascular: Negative for chest pain, palpitations and leg swelling.  Gastrointestinal: Negative for abdominal pain, nausea and vomiting.  Musculoskeletal: Positive for arthralgias and back pain. Negative for myalgias.  Skin: Negative for rash.    Allergies Patient is allergic to penicillins and sulfa antibiotics.  Past Medical History Patient  has a past medical history of Allergy, Anxiety, Back pain, Depression, Fever blister (02/12/2016), and Migraines.  Surgical History Patient  has a past surgical history that includes Ganglion cyst excision (Right, wrist); Cesarean section (N/A, 08/26/2015); Ganglion cyst excision; and Cesarean section with bilateral tubal ligation (06/06/2018).  Family History [name]'s family history includes Heart disease in her father and mother.  Social History Patient  reports that she has never smoked. She has never used smokeless tobacco. She reports that she does not drink alcohol and does not use drugs.    Objective: Vitals:   04/06/20 1439  BP: 98/60  Pulse: (!) 104  Temp: 98 F (36.7 C)  TempSrc: Temporal  Weight: 192 lb 12.8 oz (87.5 kg)  Height: 5\' 5"  (1.651 m)     Body mass index is 32.08 kg/m.  Physical Exam Vitals reviewed.  Constitutional:      Appearance: Normal appearance. She is obese.  HENT:     Head: Normocephalic and atraumatic.  Cardiovascular:     Rate and Rhythm: Normal rate and regular rhythm.     Heart sounds: Normal heart sounds.  Pulmonary:     Effort: Pulmonary effort is normal.     Breath sounds: Normal breath sounds.  Abdominal:     General: Bowel sounds are normal.     Palpations: Abdomen is soft.  Neurological:     General: No focal deficit present.     Mental Status: She is alert and oriented to person, place, and time.  Psychiatric:        Mood and Affect: Mood normal.        Behavior: Behavior normal.        Assessment/plan: 1. Chronic low back pain (Location of Primary Source of Pain) (Bilateral) (R>L) Refilled her pain medication. Still trying to get in with pain management. Recommended she call around and see if anyone will accept self pay. pmp reviewed nad filling correctly. uds utd.   2. B12 deficiency Recheck today and if back to normal will try oral replacement. Went over this with her.  - Vitamin B12   This visit occurred during the SARS-CoV-2 public health emergency.  Safety protocols were in place, including screening questions prior to the visit, additional usage of staff PPE, and extensive cleaning of exam room while observing appropriate contact time as  indicated for disinfecting solutions.     Return in about 3 months (around 07/07/2020) for pain pills/b12 f/u .   Orland Mustard, MD [facility]   04/06/2020

## 2020-04-07 ENCOUNTER — Encounter: Payer: Self-pay | Admitting: Family Medicine

## 2020-04-07 LAB — VITAMIN B12: Vitamin B-12: 191 pg/mL — ABNORMAL LOW (ref 211–911)

## 2020-04-08 MED ORDER — CYANOCOBALAMIN 1000 MCG/ML IJ SOLN: INTRAMUSCULAR | 0 refills | Status: AC

## 2020-04-08 MED ORDER — "SYRINGE 25G X 1"" 3 ML MISC": 1.0000 | 1 refills | Status: AC

## 2020-05-04 ENCOUNTER — Other Ambulatory Visit: Payer: Self-pay | Admitting: Family Medicine

## 2020-05-05 MED ORDER — OXYCODONE HCL 10 MG PO TABS: 10.0000 mg | ORAL_TABLET | Freq: Four times a day (QID) | ORAL | 0 refills | Status: DC | PRN

## 2020-05-05 NOTE — Telephone Encounter (Signed)
This will probably have to wait until Dr Artis Flock gets back - do not think medicaid will cover narcotic rx from separate prescribers.  Rachel [name]. Jimmey Ralph, MD 05/05/2020 8:31 AM

## 2020-05-05 NOTE — Telephone Encounter (Signed)
Pt called office. She states that she will be out of medicine soon. She does not file with insurance and pays out of pocket. Had same issue in June and was able to get under Dr. Mardelle Matte. She will be leaving to go out of town tomorrow for funeral

## 2020-05-31 ENCOUNTER — Encounter: Payer: Self-pay | Admitting: Family Medicine

## 2020-06-01 MED ORDER — OXYCODONE HCL 10 MG PO TABS: 10.0000 mg | ORAL_TABLET | Freq: Four times a day (QID) | ORAL | 0 refills | Status: DC | PRN

## 2020-06-15 ENCOUNTER — Encounter: Payer: Self-pay | Admitting: Family Medicine

## 2020-06-15 ENCOUNTER — Telehealth (INDEPENDENT_AMBULATORY_CARE_PROVIDER_SITE_OTHER): Payer: Medicaid Other | Admitting: Family Medicine

## 2020-06-15 VITALS — Temp 99.3°F | Ht 65.0 in | Wt 192.0 lb

## 2020-06-15 DIAGNOSIS — U071 COVID-19: Secondary | ICD-10-CM

## 2020-06-15 NOTE — Progress Notes (Signed)
Patient: Rachel [name] MRN: [id number] DOB: 10-24-1980 PCP: Orland Mustard, MD     I connected with [name] on 06/15/20 at 11:22am by a video enabled telemedicine application and verified that I am speaking with the correct person using two identifiers.  Location patient: Home Location provider: [facility], Office Persons participating in this virtual visit: [name] Friel and Dr. Artis Flock   I discussed the limitations of evaluation and management by telemedicine and the availability of in person appointments. The patient expressed understanding and agreed to proceed.   Subjective:  Chief Complaint  Patient presents with  . Covid Exposure    Pt says that she travelled to the beach, and when she came back she started feeling bad.  . Cough  . Generalized Body Aches    HPI: The patient is a 39 y.o. female who presents today for Covid symptoms. She says that she traveled to the beach, and when she came back she began to feel bad. Symptoms started on Sunday night with a really bad headache that has not gone away. As the night went on she started to have nausea and vomiting into Monday. She had some congestion and cough that started Monday night. Her whole body is achy and feels "like a train wreck." No fevers, but has had chills. Nothing over 100.0. she has a very mild cough.  she will get short of breath if she has to get up and do stuff. She was at beach and only place she thinks she could have gotten it from was at a gas station. No one else in her family is sick. She has been Nature conservation officer. No hx of asthma/lung disease. Does not smoke. No tachycardia, swollen calves.   She got tested yesterday for covid. Was told it would be 48 hours to get results. She is still waiting for this.   Review of Systems  HENT: Positive for sinus pressure, sinus pain and sore throat.   Respiratory: Positive for cough and shortness of breath.   Cardiovascular: Negative for chest pain  and palpitations.  Musculoskeletal: Positive for back pain.  Neurological: Positive for headaches.    Allergies Patient is allergic to penicillins and sulfa antibiotics.  Past Medical History Patient  has a past medical history of Allergy, Anxiety, Back pain, Depression, Fever blister (02/12/2016), and Migraines.  Surgical History Patient  has a past surgical history that includes Ganglion cyst excision (Right, wrist); Cesarean section (N/A, 08/26/2015); Ganglion cyst excision; and Cesarean section with bilateral tubal ligation (06/06/2018).  Family History [name]'s family history includes Heart disease in her father and mother.  Social History Patient  reports that she has never smoked. She has never used smokeless tobacco. She reports that she does not drink alcohol and does not use drugs.    Objective: Vitals:   06/15/20 1112  Temp: 99.3 F (37.4 C)  TempSrc: Temporal  Weight: 192 lb (87.1 kg)  Height: 5\' 5"  (1.651 m)    Body mass index is 31.95 kg/m.  Physical Exam Vitals reviewed.  Constitutional:      General: She is not in acute distress.    Appearance: Normal appearance. She is obese. She is not ill-appearing.  HENT:     Head: Normocephalic and atraumatic.  Pulmonary:     Effort: Pulmonary effort is normal. No respiratory distress.     Breath sounds: No stridor.  Neurological:     General: No focal deficit present.     Mental Status: She is alert and oriented  to person, place, and time.  Psychiatric:        Mood and Affect: Mood normal.        Behavior: Behavior normal.        Assessment/plan: 1. COVID-19 Sounds typical of covid 19. Test pending. She appears very mild at this point and is day 4 into illness. Will start with conservative therapy since mild illness, but number for infusion given as her BMI would qualify her if needed. Recommended she get a pulse ox as well with goal >93-94%.   -starting her on treatment nutraceutical bundle including zinc  sulfate, vit D, vit C, quercetin and melatonin 5-15+ days depending on symptoms.  -she will start 50-50 H202/saline nebulizer Bid-TID (outside) -ASA 81mg  (no contraindication) x5-15 days -discussed hold all NSAIDs, would just do tylenol since on ASA.  -gargle mouthwash TID -outside as much as possible. -continue quarantine from family and guidelines discussed.  -if any worsening symptoms she is to call infusion and let me know so we can initiate further treatment.  -strict precautions given.    Return if symptoms worsen or fail to improve.   , MD [facility]  06/15/2020

## 2020-06-15 NOTE — Patient Instructions (Signed)
-  starting her on treatment nutraceutical bundle including zinc sulfate 220mg /day, vit D3: 5000IU/day, vit C: 1000mg  three times a day, quercetin 500mg  twice a day and melatonin 10mg  nightly 5-15+ days depending on symptoms.  -aspirin 81mg /day  -he will start 50-50 H202/saline nebulizer Bid-TID (outside)  (3ml saline and 63ml hydrogen peroxide 3%) -gargle mouthwash TID -outside as much as possible.   -you seem mild so would do above, also recommend pulse ox and monitor to keep oxygen >93-94%. If feel like symptoms are worsening, please call infusion number to get this set up (must do within 10 days from symptom onset).   -also please email me!   Okay hope you feel better! Dr. 

## 2020-06-22 IMAGING — MR MR HEAD WO/W CM
13 series · 48 of 48 positions shown · IV contrast (8ml Gadavist)
Comparison: None.

CLINICAL DATA: Vitamin B12 deficiency with hair loss and tingling
in the extremities.

EXAM:
MRI HEAD WITHOUT AND WITH CONTRAST
TECHNIQUE: Multiplanar, multiecho pulse sequences of the brain and surrounding
structures were obtained without and with intravenous contrast.
CONTRAST:  8mL GADAVIST GADOBUTROL 1 MMOL/ML IV SOLN

[Series 5: ax dwi_tracew · axial · 3.0mm · 0.60mm/px · z∈[-49,+112]mm · 3 of 50 slices shown]
[im 1/50]
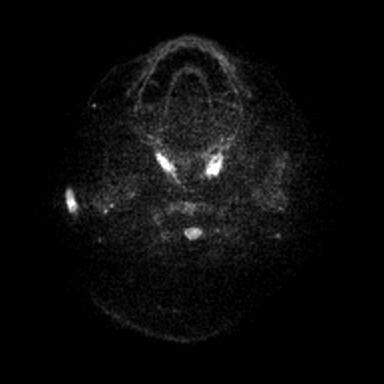
[im 25/50]
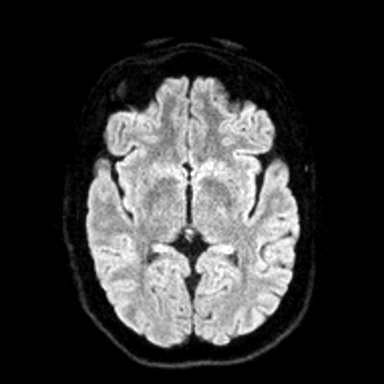
[im 50/50]
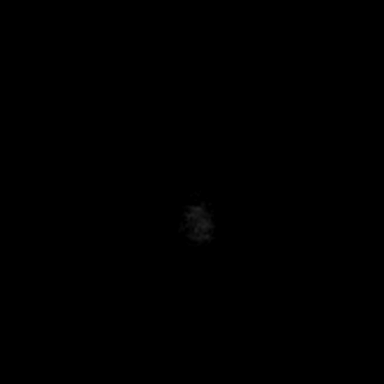

[Series 6: ax dwi_adc · axial · 3.0mm · 0.60mm/px · z∈[-49,+109]mm · 3 of 49 slices shown]
[im 1/49]
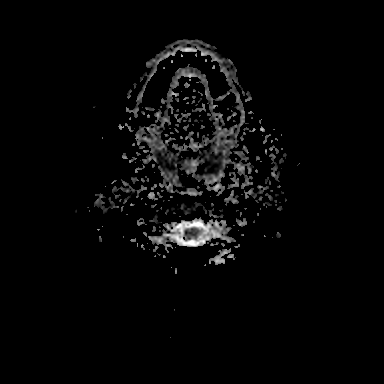
[im 25/49]
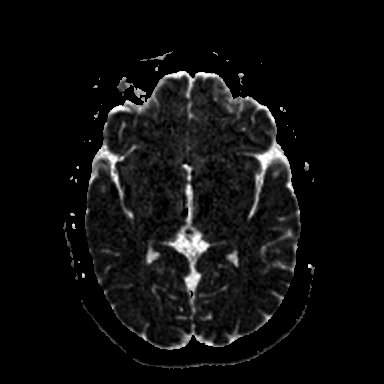
[im 49/49]
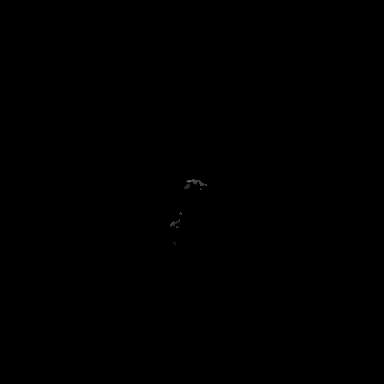

[Series 7: cor dwi_tracew · coronal · 5.0mm · 0.68mm/px · 3 of 40 slices shown]
[im 1/40]
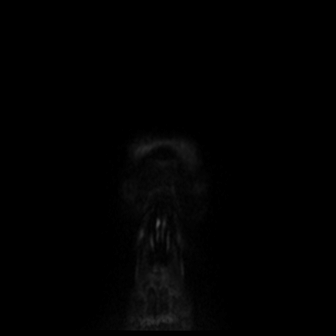
[im 20/40]
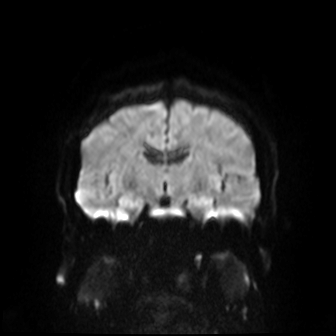
[im 40/40]
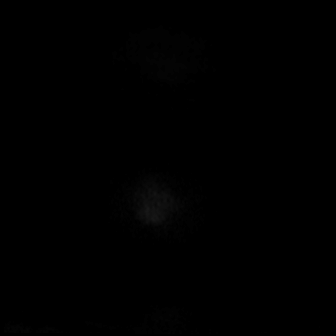

[Series 8: cor dwi_adc · coronal · 5.0mm · 0.68mm/px · 3 of 40 slices shown]
[im 1/40]
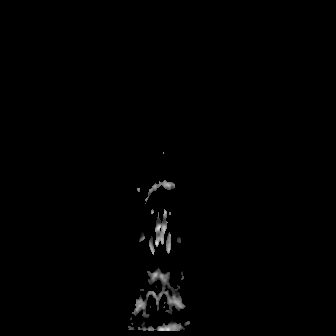
[im 20/40]
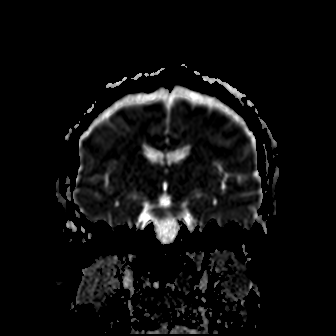
[im 40/40]
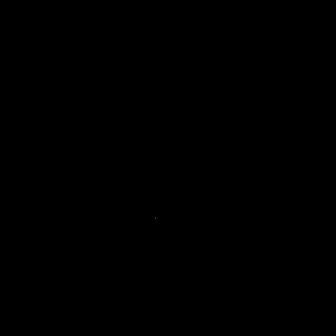

[Series 9: T1 · sagittal · 5.0mm · 0.60mm/px · 1 of 23 slices shown (1 of 2)]
[im 1/23]
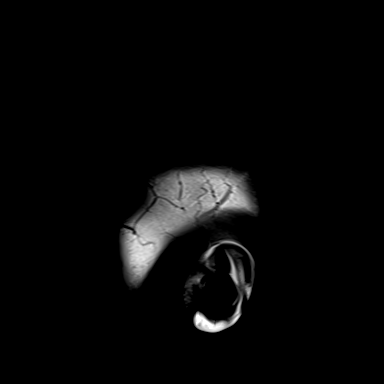

[Series 10: T2 · axial · 5.0mm · 0.55mm/px · z∈[-46,+115]mm · 2 of 28 slices shown]
[im 1/28]
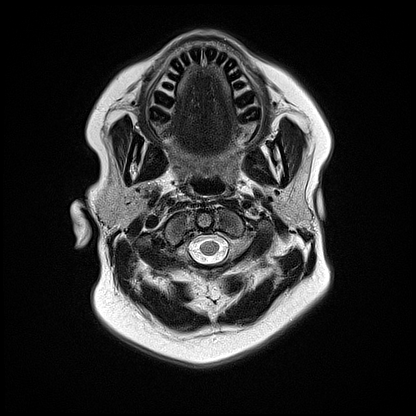
[im 28/28]
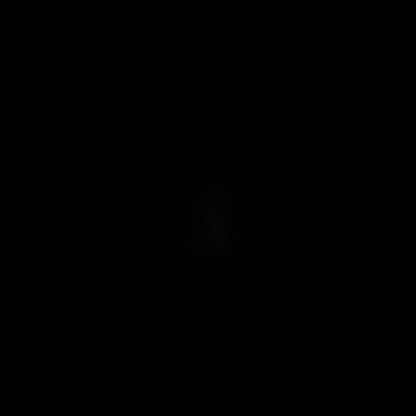

[Series 12: pha_images · axial · 3.0mm · 0.90mm/px · z∈[-42,+107]mm · 3 of 51 slices shown]
[im 1/51]
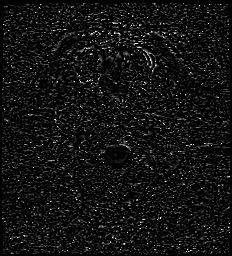
[im 26/51]
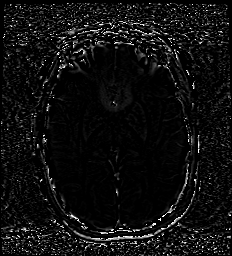
[im 51/51]
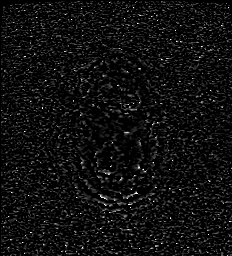

[Series 13: swi_images · axial · 3.0mm · 0.90mm/px · z∈[-42,+110]mm · 3 of 52 slices shown]
[im 1/52]
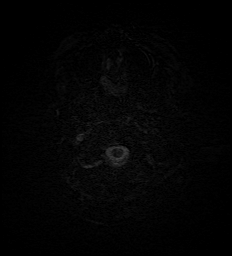
[im 26/52]
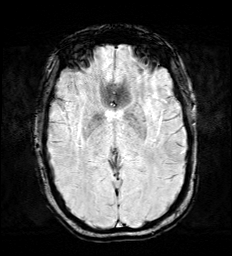
[im 52/52]
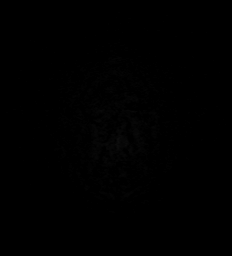

[Series 15: FLAIR · axial · 3.0mm · 0.55mm/px · z∈[-46,+115]mm · 3 of 55 slices shown]
[im 1/55]
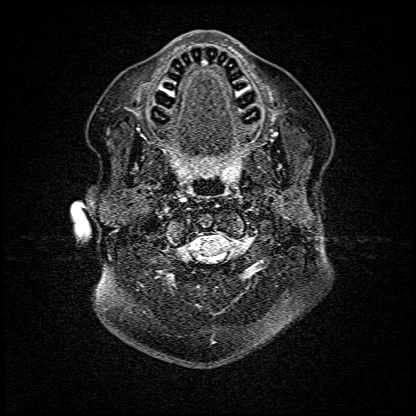
[im 28/55]
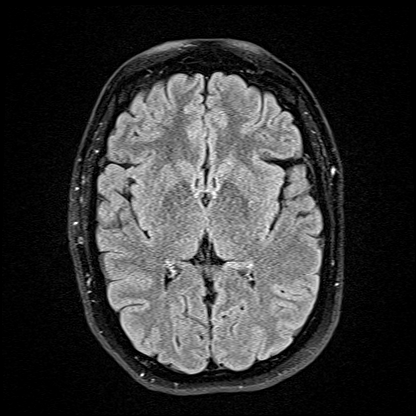
[im 55/55]
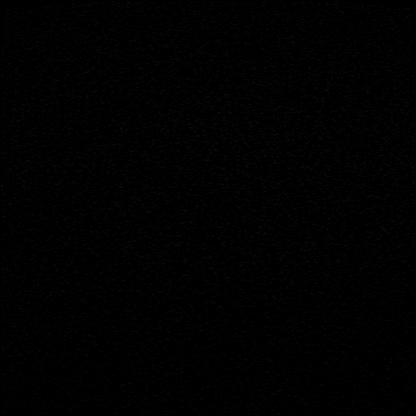

[Series 16: T1 · axial · 1.0mm · 0.94mm/px · z∈[-43,+112]mm · 10 of 157 slices shown (2 of 2)]
[im 1/157]
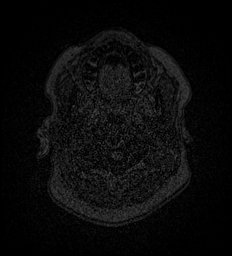
[im 18/157]
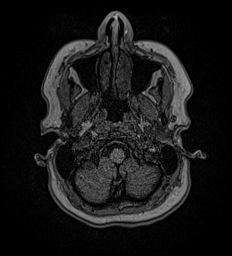
[im 35/157]
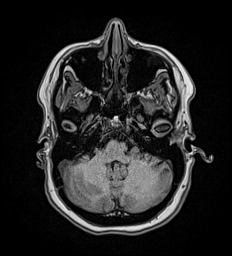
[im 53/157]
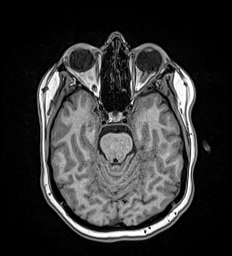
[im 70/157]
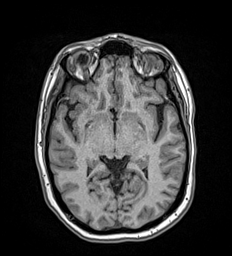
[im 87/157]
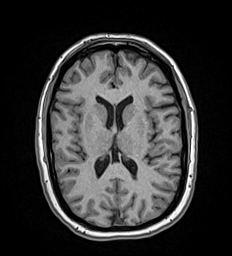
[im 105/157]
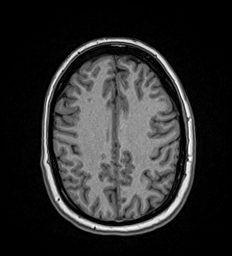
[im 122/157]
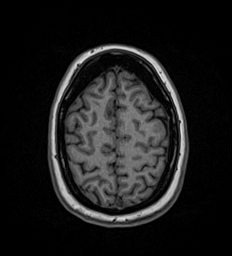
[im 139/157]
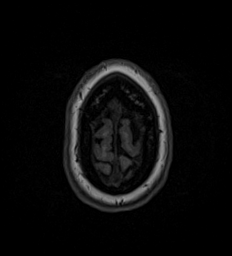
[im 157/157]
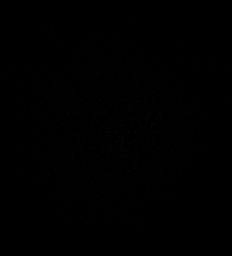

[Series 17: T2 post-contrast · coronal · 5.0mm · 0.60mm/px · 2 of 30 slices shown]
[im 1/30]
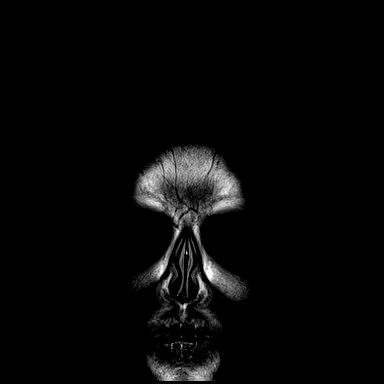
[im 30/30]
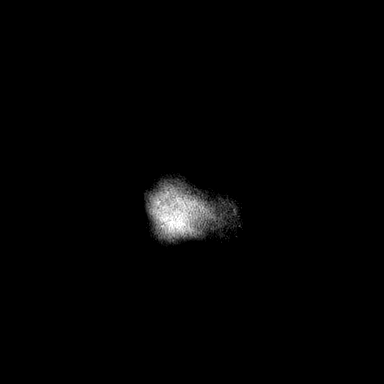

[Series 18: T1 post-contrast · axial · 1.0mm · 0.94mm/px · z∈[-43,+114]mm · 10 of 159 slices shown (1 of 2)]
[im 1/159]
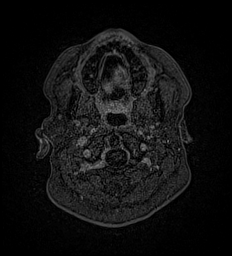
[im 18/159]
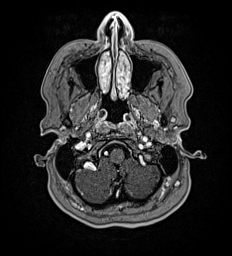
[im 36/159]
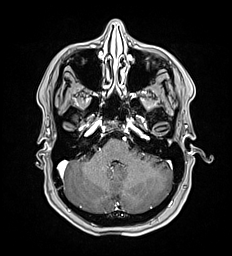
[im 53/159]
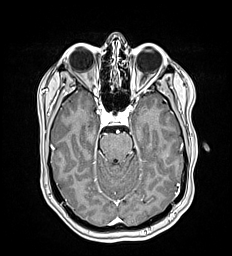
[im 71/159]
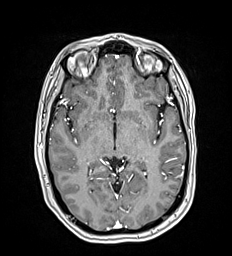
[im 88/159]
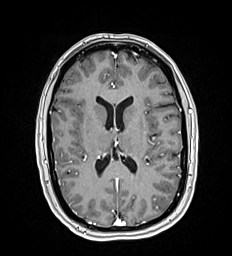
[im 106/159]
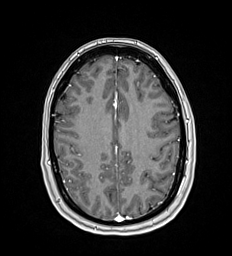
[im 123/159]
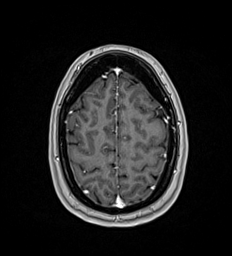
[im 141/159]
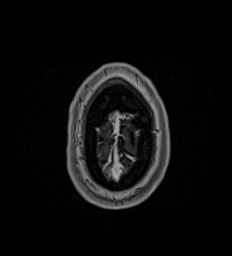
[im 159/159]
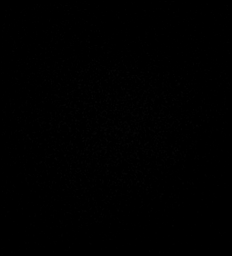

[Series 19: T1 post-contrast · coronal · 5.0mm · 0.72mm/px · 2 of 30 slices shown (2 of 2)]
[im 1/30]
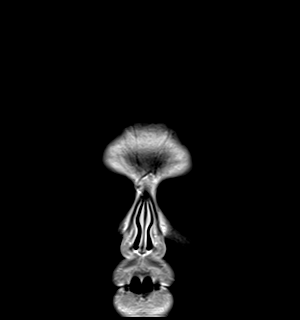
[im 30/30]
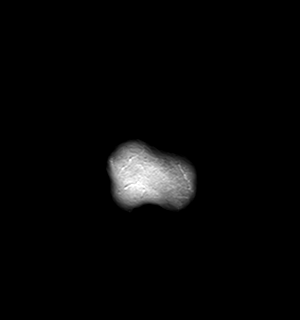

[48 of 48 positions shown; findings below may reference images not displayed]

FINDINGS: BRAIN: No acute infarct, acute hemorrhage or extra-axial collection.
Normal white matter signal for age. Normal volume of brain
parenchyma and CSF spaces. Midline structures are normal.

VASCULAR: Major flow voids are preserved. Susceptibility-sensitive
sequences show no chronic microhemorrhage or superficial siderosis.

SKULL AND UPPER CERVICAL SPINE: Normal calvarium and skull base.
Visualized upper cervical spine and soft tissues are normal.

SINUSES/ORBITS: No fluid levels or advanced mucosal thickening. No
mastoid or middle ear effusion. Normal orbits.
IMPRESSION: Normal brain MRI.

## 2020-07-03 ENCOUNTER — Other Ambulatory Visit: Payer: Self-pay | Admitting: Family Medicine

## 2020-07-04 ENCOUNTER — Encounter: Payer: Self-pay | Admitting: Family Medicine

## 2020-07-04 MED ORDER — OXYCODONE HCL 10 MG PO TABS: 10.0000 mg | ORAL_TABLET | Freq: Four times a day (QID) | ORAL | 0 refills | Status: DC | PRN

## 2020-07-04 NOTE — Telephone Encounter (Signed)
Last refill: 06/01/20 #120, 0 Last OV: 06/15/20 dx. COVID

## 2020-08-01 ENCOUNTER — Other Ambulatory Visit: Payer: Self-pay

## 2020-08-01 ENCOUNTER — Encounter: Payer: Self-pay | Admitting: Family Medicine

## 2020-08-01 ENCOUNTER — Ambulatory Visit (INDEPENDENT_AMBULATORY_CARE_PROVIDER_SITE_OTHER): Payer: Medicaid Other | Admitting: Family Medicine

## 2020-08-01 VITALS — BP 121/83 | HR 104 | Temp 97.4°F | Ht 65.0 in | Wt 198.4 lb

## 2020-08-01 DIAGNOSIS — E538 Deficiency of other specified B group vitamins: Secondary | ICD-10-CM

## 2020-08-01 DIAGNOSIS — Z79899 Other long term (current) drug therapy: Secondary | ICD-10-CM

## 2020-08-01 DIAGNOSIS — Z23 Encounter for immunization: Secondary | ICD-10-CM

## 2020-08-01 MED ORDER — OXYCODONE HCL 10 MG PO TABS: 10.0000 mg | ORAL_TABLET | Freq: Four times a day (QID) | ORAL | 0 refills | Status: DC | PRN

## 2020-08-01 NOTE — Patient Instructions (Signed)
-  checking B12 -annual drug screen -refilled oxy  -f/u in feb. For annual/med check up  -any news on pain referral?   Good seeing you! Dr. Artis Flock

## 2020-08-01 NOTE — Addendum Note (Signed)
Addended by: Orland Mustard on: 08/01/2020 12:15 PM   Modules accepted: Orders

## 2020-08-01 NOTE — Addendum Note (Signed)
Addended by: Laddie Aquas A on: 08/01/2020 01:40 PM   Modules accepted: Orders

## 2020-08-01 NOTE — Progress Notes (Signed)
Patient: Rachel [name] MRN: [id number] DOB: December 03, 1980 PCP: Orland Mustard, MD     Subjective:  Chief Complaint  Patient presents with  . Medication Management  . b12 deficiency    HPI: The patient is a 39 y.o. female who presents today for medication management. No new concerns today. Pt requests flu vaccine.  She is on chronic opioid for chronic pain. She is currently on oxycodone 10mg  4x/day. Routine f/u today for pain medication. She has a drug contract from 2018. Takes medication as prescribed. Pain is controlled. She has a referral to pain management, but has not been able to do this.   b12 deficiency Check today. On oral replacement after we did injections. pernicious anemia ruled out.   Had covid a few months ago. Did fine.    Review of Systems  Constitutional: Negative for chills, fatigue and fever.  HENT: Negative for dental problem, ear pain, hearing loss and trouble swallowing.   Eyes: Negative for visual disturbance.  Respiratory: Negative for cough, chest tightness and shortness of breath.   Cardiovascular: Negative for chest pain, palpitations and leg swelling.  Gastrointestinal: Negative for abdominal pain, blood in stool, diarrhea, nausea and vomiting.  Endocrine: Negative for cold intolerance, polydipsia, polyphagia and polyuria.  Genitourinary: Negative for dysuria, frequency, hematuria and pelvic pain.  Musculoskeletal: Negative for arthralgias.  Skin: Negative for rash.  Neurological: Negative for dizziness and headaches.  Psychiatric/Behavioral: Negative for dysphoric mood and sleep disturbance. The patient is not nervous/anxious.     Allergies Patient is allergic to penicillins and sulfa antibiotics.  Past Medical History Patient  has a past medical history of Allergy, Anxiety, Back pain, Depression, Fever blister (02/12/2016), and Migraines.  Surgical History Patient  has a past surgical history that includes Ganglion cyst excision (Right,  wrist); Cesarean section (N/A, 08/26/2015); Ganglion cyst excision; and Cesarean section with bilateral tubal ligation (06/06/2018).  Family History [name]'s family history includes Heart disease in her father and mother.  Social History Patient  reports that she has never smoked. She has never used smokeless tobacco. She reports that she does not drink alcohol and does not use drugs.    Objective: Vitals:   08/01/20 1012  BP: 121/83  Pulse: (!) 104  Temp: (!) 97.4 F (36.3 C)  TempSrc: Temporal  SpO2: 97%  Weight: 198 lb 6.4 oz (90 kg)  Height: 5\' 5"  (1.651 m)    Body mass index is 33.02 kg/m.  Physical Exam Vitals reviewed.  Constitutional:      Appearance: Normal appearance. She is obese.  HENT:     Head: Normocephalic and atraumatic.  Cardiovascular:     Rate and Rhythm: Normal rate and regular rhythm.     Heart sounds: Normal heart sounds.  Pulmonary:     Effort: Pulmonary effort is normal.     Breath sounds: Normal breath sounds.  Abdominal:     General: Abdomen is flat. Bowel sounds are normal.     Palpations: Abdomen is soft.  Skin:    Capillary Refill: Capillary refill takes less than 2 seconds.  Neurological:     General: No focal deficit present.     Mental Status: She is alert and oriented to person, place, and time.  Psychiatric:        Mood and Affect: Mood normal.        Behavior: Behavior normal.        Assessment/plan: 1. Medication management Drug contract signed in 2018. UDS today for yearly screen. PMP website  checked and filled appropriately. Refill given today. Pain management is too expensive for her. Discussed she is young and I would really like to wean her off medication to something else. She may be more on board with this. Will continue to see though. F/u in 4 months for annual/depression appointment.  - Pain Management Screening Profile (10S)  2. B12 deficiency -check labs today   3. Flu vaccine  This visit occurred during the  SARS-CoV-2 public health emergency.  Safety protocols were in place, including screening questions prior to the visit, additional usage of staff PPE, and extensive cleaning of exam room while observing appropriate contact time as indicated for disinfecting solutions.      Return in about 4 months (around 12/02/2020) for annual .    Orland Mustard, MD [facility] [facility]   08/01/2020

## 2020-08-01 NOTE — Addendum Note (Signed)
Addended by: Janeece Riggers D on: 08/01/2020 11:23 AM   Modules accepted: Orders

## 2020-08-03 LAB — DRUG MONITORING, PANEL 8 WITH CONFIRMATION, URINE
6 Acetylmorphine: NEGATIVE ng/mL (ref <10)
Alcohol Metabolites: NEGATIVE ng/mL
Amphetamines: NEGATIVE ng/mL (ref <500)
Benzodiazepines: NEGATIVE ng/mL (ref <100)
Buprenorphine, Urine: NEGATIVE ng/mL (ref <5)
Cocaine Metabolite: NEGATIVE ng/mL (ref <150)
Codeine: NEGATIVE ng/mL (ref <50)
Creatinine: 136.5 mg/dL
Hydrocodone: NEGATIVE ng/mL (ref <50)
Hydromorphone: NEGATIVE ng/mL (ref <50)
MDMA: NEGATIVE ng/mL (ref <500)
Marijuana Metabolite: NEGATIVE ng/mL (ref <20)
Morphine: NEGATIVE ng/mL (ref <50)
Norhydrocodone: NEGATIVE ng/mL (ref <50)
Noroxycodone: >10000 ng/mL — ABNORMAL HIGH (ref <50)
Opiates: NEGATIVE ng/mL (ref <100)
Oxidant: NEGATIVE ug/mL
Oxycodone: 6315 ng/mL — ABNORMAL HIGH (ref <50)
Oxycodone: POSITIVE ng/mL — AB (ref <100)
Oxymorphone: 3531 ng/mL — ABNORMAL HIGH (ref <50)
pH: 6 (ref 4.5–9.0)

## 2020-08-03 LAB — DM TEMPLATE

## 2020-08-28 ENCOUNTER — Encounter: Payer: Self-pay | Admitting: Family Medicine

## 2020-08-28 ENCOUNTER — Other Ambulatory Visit: Payer: Self-pay | Admitting: Family Medicine

## 2020-08-29 NOTE — Telephone Encounter (Signed)
Pt is requesting Oxycodone 10mg  tab LOV: 08/01/2020 No future appointments scheduled.  Approve?

## 2020-08-29 NOTE — Telephone Encounter (Signed)
Duplicate request:  Pt is requesting Oxycodone 10mg  tab LOV: 08/01/2020 No future scheduled appointments.  Approve?

## 2020-08-30 MED ORDER — OXYCODONE HCL 10 MG PO TABS: 10.0000 mg | ORAL_TABLET | Freq: Four times a day (QID) | ORAL | 0 refills | Status: DC | PRN

## 2020-09-16 ENCOUNTER — Other Ambulatory Visit: Payer: Self-pay | Admitting: Family Medicine

## 2020-09-16 DIAGNOSIS — F341 Dysthymic disorder: Secondary | ICD-10-CM

## 2020-09-19 ENCOUNTER — Encounter: Payer: Self-pay | Admitting: Family Medicine

## 2020-09-28 ENCOUNTER — Other Ambulatory Visit: Payer: Self-pay | Admitting: Family Medicine

## 2020-09-28 NOTE — Telephone Encounter (Signed)
Pt requesting Oxycodone 10 mg LOV:08/01/2020 No future visits scheduled Last refill: 08/30/2020  Approve?

## 2020-09-29 ENCOUNTER — Encounter: Payer: Self-pay | Admitting: Family Medicine

## 2020-09-29 MED ORDER — OXYCODONE HCL 10 MG PO TABS: 10.0000 mg | ORAL_TABLET | Freq: Four times a day (QID) | ORAL | 0 refills | Status: DC | PRN

## 2020-09-29 NOTE — Telephone Encounter (Signed)
Last refill: 08/30/20 #120, 0 Last OV: 08/01/20 dx. Med management

## 2020-10-28 ENCOUNTER — Encounter: Payer: Self-pay | Admitting: Family Medicine

## 2020-10-30 ENCOUNTER — Other Ambulatory Visit: Payer: Self-pay | Admitting: Family Medicine

## 2020-10-31 NOTE — Telephone Encounter (Signed)
Pt is requesting Oxycodone 10 mg tab. LOV: 08/01/2020 No future visits scheduled  Please Advise.

## 2020-11-02 ENCOUNTER — Other Ambulatory Visit: Payer: Self-pay | Admitting: Family Medicine

## 2020-11-03 ENCOUNTER — Encounter: Payer: Self-pay | Admitting: Family Medicine

## 2020-11-03 MED ORDER — OXYCODONE HCL 10 MG PO TABS: 10.0000 mg | ORAL_TABLET | Freq: Four times a day (QID) | ORAL | 0 refills | Status: DC | PRN

## 2020-11-04 ENCOUNTER — Encounter: Payer: Self-pay | Admitting: Family Medicine

## 2020-11-04 ENCOUNTER — Telehealth (INDEPENDENT_AMBULATORY_CARE_PROVIDER_SITE_OTHER): Payer: HRSA Program | Admitting: Family Medicine

## 2020-11-04 VITALS — Ht 65.0 in | Wt 198.4 lb

## 2020-11-04 DIAGNOSIS — U071 COVID-19: Secondary | ICD-10-CM | POA: Diagnosis not present

## 2020-11-04 MED ORDER — BUDESONIDE 1 MG/2ML IN SUSP: 1.0000 mg | Freq: Every day | RESPIRATORY_TRACT | 1 refills | Status: DC

## 2020-11-04 NOTE — Progress Notes (Signed)
Patient: Rachel [name] MRN: [id number] DOB: August 29, 1981 PCP: Orland Mustard, MD     I connected with [name] on 11/04/20 at 1:11pm by a video enabled telemedicine application and verified that I am speaking with the correct person using two identifiers.  Location patient: Home Location provider: [facility], Office Persons participating in this virtual visit: [name] Oshiro and Dr. Artis Flock   I discussed the limitations of evaluation and management by telemedicine and the availability of in person appointments. The patient expressed understanding and agreed to proceed.   Subjective:  Chief Complaint  Patient presents with  . Covid Positive    Symptoms started last Thursday.    HPI: The patient is a 40 y.o. female who presents today for Covid. Her symptoms started last Thursday with bad back pain and from there it progressed with fever to 102, chills, sore throat. She has continued to have back pain, headache and cough and head congestion. Her cough is dry in nature and hurts in her chest when she coughs. She does have a pulse ox and it's 95-97%. She has not had fever for a few days. She is tired. She does not smoke. Had prior covid infection in 9/21. Not vaccinated.    Review of Systems  Constitutional: Positive for fatigue. Negative for chills and fever.  HENT: Positive for congestion. Negative for sore throat.   Respiratory: Positive for cough.   Musculoskeletal: Positive for back pain.  Neurological: Positive for dizziness. Negative for headaches.    Allergies Patient is allergic to penicillins and sulfa antibiotics.  Past Medical History Patient  has a past medical history of Allergy, Anxiety, Back pain, Depression, Fever blister (02/12/2016), and Migraines.  Surgical History Patient  has a past surgical history that includes Ganglion cyst excision (Right, wrist); Cesarean section (N/A, 08/26/2015); Ganglion cyst excision; and Cesarean section with  bilateral tubal ligation (06/06/2018).  Family History [name]'s family history includes Heart disease in her father and mother.  Social History Patient  reports that she has never smoked. She has never used smokeless tobacco. She reports that she does not drink alcohol and does not use drugs.    Objective: Vitals:   11/04/20 1252  SpO2: 97%  Weight: 198 lb 6.6 oz (90 kg)  Height: 5\' 5"  (1.651 m)    Body mass index is 33.02 kg/m.  Physical Exam Vitals reviewed.  Constitutional:      General: She is not in acute distress.    Appearance: Normal appearance. She is not ill-appearing.  HENT:     Head: Normocephalic and atraumatic.  Pulmonary:     Effort: Pulmonary effort is normal.     Comments: Resting comfortably. Taking in full complete sentences with on increased work of breathing.  Neurological:     General: No focal deficit present.     Mental Status: She is alert and oriented to person, place, and time.  Psychiatric:        Mood and Affect: Mood normal.        Behavior: Behavior normal.        Assessment/plan: 1. COVID-19 -9 day of symptoms. Low risk. Prior covid infection in 9/21.  -starting her on treatment nutraceutical bundle including zinc sulfate, vit D, vit C, quercetin and melatonin 5-15+ days depending on symptoms.  -iodine 1% nasal drop 1-2x/day -ivermectin X5 days. Discussed with family that this is still considered experimental; however, multiple studies (over 71 at this point) have been done that have shown significant benefit with it's  use and it's safety profile can not be beat. Side effects discussed. They would like to proceed.  -already on SSRI.  -pulmicort if needed, but satting well and no shortness of breath. She is to let me know if any worsening respiratory symptoms. Will add on anti androgens and oral steroids if needed.  -gargle mouthwash TID -outside as much as possible.  -pulse ox >93-94% -prone sleeping if possible.  -strict precautions  given.      Return if symptoms worsen or fail to improve.     Orland Mustard, MD [facility]  11/04/2020

## 2020-11-04 NOTE — Patient Instructions (Addendum)
Call [facility]: [phone] for ivermectin. Call in 30 minutes so we can get in mail for you.   1) vitamin D3: 5000IU/day 2) vitamin C: 2000-3000mg /day 3) zinc: 100mg /day 4) melatonin 10mg /day 5) aspirin if no contraindication   6) can do cimetidine 400mg  twice a day as well.   1% iodine drop into each nasal cavity daily.    I also sent in pulmicort for you to use a nebulizer if you feel tight in your lungs or oxygen goes below 94%.   Please keep in touch with me if you feel like worsening symptoms.   [phone].

## 2020-12-05 ENCOUNTER — Other Ambulatory Visit: Payer: Self-pay | Admitting: Family Medicine

## 2020-12-05 ENCOUNTER — Encounter: Payer: Self-pay | Admitting: Family Medicine

## 2020-12-05 MED ORDER — OXYCODONE HCL 10 MG PO TABS: 10.0000 mg | ORAL_TABLET | Freq: Four times a day (QID) | ORAL | 0 refills | Status: DC | PRN

## 2020-12-05 NOTE — Telephone Encounter (Signed)
Rx request 

## 2020-12-19 ENCOUNTER — Ambulatory Visit
Admission: EM | Admit: 2020-12-19 | Discharge: 2020-12-19 | Disposition: A | Discharge status: Home / Self Care | Payer: Medicaid Other | Attending: Family Medicine | Admitting: Family Medicine

## 2020-12-19 ENCOUNTER — Encounter: Payer: Self-pay | Admitting: Family Medicine

## 2020-12-19 ENCOUNTER — Other Ambulatory Visit: Payer: Self-pay

## 2020-12-19 DIAGNOSIS — R059 Cough, unspecified: Secondary | ICD-10-CM

## 2020-12-19 MED ORDER — BENZONATATE 100 MG PO CAPS: 100.0000 mg | ORAL_CAPSULE | Freq: Three times a day (TID) | ORAL | 0 refills | Status: DC

## 2020-12-19 MED ORDER — AZITHROMYCIN 250 MG PO TABS: 250.0000 mg | ORAL_TABLET | Freq: Every day | ORAL | 0 refills | Status: DC

## 2020-12-19 MED ORDER — ALBUTEROL SULFATE HFA 108 (90 BASE) MCG/ACT IN AERS: 1.0000 | INHALATION_SPRAY | Freq: Four times a day (QID) | RESPIRATORY_TRACT | 0 refills | Status: DC | PRN

## 2020-12-19 NOTE — Discharge Instructions (Signed)
Medications as prescribed Follow up as needed for continued or worsening symptoms  

## 2020-12-19 NOTE — ED Triage Notes (Signed)
Pt c/o sore throat upon waking yesterday, since resolved. C/o non-productive cough, congestion, body aches, HA, nausea, fever with Tmax 101, runny nose, lightheadedness onset yesterday.  Denies v/d, ear pain.   Last dose tylenol 500mg  at 1415, took oxycodone at 1230 for chronic pain management Had COVID in January 2022 Mild erythema to tonsils and uvula. No h/o strep throat.

## 2020-12-20 NOTE — ED Provider Notes (Signed)
Renaldo Fiddler    CSN: [id number] Arrival date & time: 12/19/20  1526      History   Chief Complaint Chief Complaint  Patient presents with  . Cough  . Nasal Congestion    HPI Rachel [name] is a 40 y.o. female.   Pt is a 40 year old female that presents with sore throat upon waking yesterday, since resolved. C/o non-productive cough, congestion, body aches, HA, nausea, fever with Tmax 101, runny nose, lightheadedness onset yesterday. Denies v/d, ear pain. Last dose tylenol 500mg  at 1415, took oxycodone at 1230 for chronic pain management.  Had COVID in January 2022. Concerned for bronchitis.    Cough   Past Medical History:  Diagnosis Date  . Allergy   . Anxiety   . Back pain   . Depression   . Fever blister 02/12/2016  . Migraines     Patient Active Problem List   Diagnosis Date Noted  . Vitamin D deficiency 11/13/2019  . B12 deficiency 11/13/2019  . Long term current use of opiate analgesic 07/31/2016  . 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 Chronic shoulder pain (Location of Secondary source of pain) (Left) 07/31/2016  . Chronic lower extremity pain (Location of Tertiary source of pain) (Bilateral) (R>L) 07/31/2016  . Chronic low back pain (Location of Primary Source of Pain) (Bilateral) (R>L) 12/16/2015  . Depression, recurrent (HCC) 03/06/2015    Past Surgical History:  Procedure Laterality Date  . CESAREAN SECTION N/A 08/26/2015   Procedure: CESAREAN SECTION;  Surgeon: 08/28/2015, MD;  Location: [facility] ORS;  Service: Obstetrics;  Laterality: N/A;  . CESAREAN SECTION WITH BILATERAL TUBAL LIGATION  06/06/2018   Procedure: CESAREAN SECTION WITH BILATERAL TUBAL LIGATION;  Surgeon: Ward, 06/08/2018, MD;  Location: [facility] ORS;  Service: Obstetrics;;  . GANGLION CYST EXCISION Right wrist  . GANGLION CYST EXCISION     right hand 07/2014    OB History    Gravida  3   Para  3   Term  2   Preterm  1   AB  0   Living  3     SAB   0   IAB  0   Ectopic  0   Multiple  0   Live Births  3            Home Medications    Prior to Admission medications   Medication Sig Start Date End Date Taking? Authorizing Provider  albuterol (VENTOLIN HFA) 108 (90 Base) MCG/ACT inhaler Inhale 1-2 puffs into the lungs every 6 (six) hours as needed for wheezing or shortness of breath. 12/19/20  Yes Belia Febo A, NP  azithromycin (ZITHROMAX) 250 MG tablet Take 1 tablet (250 mg total) by mouth daily. Take first 2 tablets together, then 1 every day until finished. 12/19/20  Yes Jibril Mcminn A, NP  Oxycodone HCl 10 MG TABS Take 1 tablet (10 mg total) by mouth 4 (four) times daily as needed. 12/05/20  Yes 12/07/20, MD  sertraline (ZOLOFT) 100 MG tablet TAKE 2 TABLETS BY MOUTH ONCE DAILY 09/18/20  Yes 14/12/21, MD  benzonatate (TESSALON) 100 MG capsule Take 1 capsule (100 mg total) by mouth every 8 (eight) hours. 12/19/20   Ngoc Daughtridge, 12/21/20 A, NP  budesonide (PULMICORT) 1 MG/2ML nebulizer solution Take 2 mLs (1 mg total) by nebulization daily. 11/04/20   11/06/20, MD  cholecalciferol (VITAMIN D3) 25 MCG (1000 UNIT) tablet Take 1,000 Units by mouth daily.  [provider]  cyanocobalamin (,VITAMIN B-12,) 1000 MCG/ML injection 1 ml ( )  daily x 7 days then weekly x 4 weeks then once/month Patient not taking: No sig reported 04/08/20   Orland Mustard, MD  ibuprofen (ADVIL) 200 MG tablet Take 200 mg by mouth every 6 (six) hours as needed.    [provider]  Syringe/Needle, Disp, (SYRINGE 3CC/25GX1") 25G X 1" 3 ML MISC 1 Device by Does not apply route as directed. 04/08/20   Orland Mustard, MD  Vitamin D, Ergocalciferol, (DRISDOL) 1.25 MG (50000 UNIT) CAPS capsule One capsule by mouth once a week for 12 weeks. Then take 2000IU/day Patient not taking: Reported on 11/04/2020 11/13/19   Orland Mustard, MD    Family History Family History  Problem Relation Age of Onset  . Heart disease Mother   . Heart disease  Father     Social History Social History   Tobacco Use  . Smoking status: Never Smoker  . Smokeless tobacco: Never Used  Substance Use Topics  . Alcohol use: No  . Drug use: No     Allergies   Penicillins and Sulfa antibiotics   Review of Systems Review of Systems  Respiratory: Positive for cough.      Physical Exam Triage Vital Signs ED Triage Vitals  Enc Vitals Group     BP 12/19/20 1533 126/83     Pulse Rate 12/19/20 1533 (!) 113     Resp 12/19/20 1533 18     Temp 12/19/20 1533 98.9 F (37.2 C)     Temp Source 12/19/20 1533 Oral     SpO2 12/19/20 1533 98 %     Weight --      Height --      Head Circumference --      Peak Flow --      Pain Score 12/19/20 1539 7     Pain Loc --      Pain Edu? --      Excl. in GC? --    No data found.  Updated Vital Signs BP 126/83 (BP Location: Left Arm)   Pulse (!) 113   Temp 98.9 F (37.2 C) (Oral)   Resp 18   LMP 11/27/2020   SpO2 98%   Visual Acuity Right Eye Distance:   Left Eye Distance:   Bilateral Distance:    Right Eye Near:   Left Eye Near:    Bilateral Near:     Physical Exam Vitals and nursing note reviewed.  Constitutional:      General: She is not in acute distress.    Appearance: Normal appearance. She is not ill-appearing, toxic-appearing or diaphoretic.  HENT:     Head: Normocephalic.     Right Ear: Tympanic membrane and ear canal normal.     Left Ear: Tympanic membrane and ear canal normal.     Nose: Nose normal.     Mouth/Throat:     Pharynx: Oropharynx is clear.  Eyes:     Conjunctiva/sclera: Conjunctivae normal.  Cardiovascular:     Rate and Rhythm: Normal rate and regular rhythm.  Pulmonary:     Effort: Pulmonary effort is normal.     Comments: Harsh cough. Mild expiratory wheezing.  Musculoskeletal:        General: Normal range of motion.     Cervical back: Normal range of motion.  Skin:    General: Skin is warm and dry.     Findings: No rash.  Neurological:     Mental  Status: She is alert.  Psychiatric:        Mood and Affect: Mood normal.      UC Treatments / Results  Labs (all labs ordered are listed, but only abnormal results are displayed) Labs Reviewed - No data to display  EKG   Radiology No results found.  Procedures Procedures (including critical care time)  Medications Ordered in UC Medications - No data to display  Initial Impression / Assessment and Plan / UC Course  I have reviewed the triage vital signs and the nursing notes.  Pertinent labs & imaging results that were available during my care of the patient were reviewed by me and considered in my medical decision making (see chart for details).     Cough Treating for bronchitis  Medicines as prescribed.  Follow up as needed for continued or worsening symptoms  Final Clinical Impressions(s) / UC Diagnoses   Final diagnoses:  Cough     Discharge Instructions     Medications as prescribed Follow up as needed for continued or worsening symptoms     ED Prescriptions    Medication Sig Dispense Auth. Provider   benzonatate (TESSALON) 100 MG capsule Take 1 capsule (100 mg total) by mouth every 8 (eight) hours. 21 capsule Caroleena Paolini A, NP   azithromycin (ZITHROMAX) 250 MG tablet Take 1 tablet (250 mg total) by mouth daily. Take first 2 tablets together, then 1 every day until finished. 6 tablet Tadarrius Burch A, NP   albuterol (VENTOLIN HFA) 108 (90 Base) MCG/ACT inhaler Inhale 1-2 puffs into the lungs every 6 (six) hours as needed for wheezing or shortness of breath. 1 each Janace Aris, NP     PDMP not reviewed this encounter.   Janace Aris, NP 12/20/20 [phone]

## 2021-01-02 ENCOUNTER — Encounter: Payer: Self-pay | Admitting: Family Medicine

## 2021-01-02 ENCOUNTER — Other Ambulatory Visit: Payer: Self-pay | Admitting: Family Medicine

## 2021-01-04 ENCOUNTER — Other Ambulatory Visit: Payer: Self-pay | Admitting: Family Medicine

## 2021-01-04 MED ORDER — OXYCODONE HCL 5 MG PO TABS: 5.0000 mg | ORAL_TABLET | Freq: Four times a day (QID) | ORAL | 0 refills | Status: DC | PRN

## 2021-01-23 ENCOUNTER — Ambulatory Visit: Payer: Self-pay | Admitting: Family Medicine

## 2021-01-23 ENCOUNTER — Telehealth: Payer: Self-pay | Admitting: Family Medicine

## 2021-01-23 MED ORDER — OXYCODONE HCL 7.5 MG PO TABS
ORAL_TABLET | ORAL | 0 refills | Status: DC
Start: 2021-01-23 — End: 2021-01-23

## 2021-01-23 MED ORDER — OXYCODONE HCL 5 MG PO TABS: ORAL_TABLET | ORAL | 0 refills | Status: DC

## 2021-01-23 NOTE — Telephone Encounter (Signed)
Please call pharmacy and cancel 7.5mg  oxycodone px.   I  Resent a 5mg  px.  Aw

## 2021-01-23 NOTE — Telephone Encounter (Signed)
I spoke with the pharmacy to cancel refill. Refill cancelled.

## 2021-02-02 ENCOUNTER — Other Ambulatory Visit: Payer: Self-pay | Admitting: Family Medicine

## 2021-02-03 ENCOUNTER — Encounter: Payer: Self-pay | Admitting: Family Medicine

## 2021-02-03 MED ORDER — OXYCODONE HCL 5 MG PO TABS: ORAL_TABLET | ORAL | 0 refills | Status: DC

## 2021-02-03 NOTE — Telephone Encounter (Signed)
Please call her and let her know we are tapering her down. This px should have lasted her 2 weeks it's only been a week since we filled it. Is she completely out?   Dr. Artis Flock

## 2021-02-03 NOTE — Telephone Encounter (Signed)
Rx request 

## 2021-02-03 NOTE — Telephone Encounter (Signed)
She says that she will be out soon, it was requested early because sometimes it takes a couple of days to refill. I also made pt aware that Dr. Blair Heys last day seeing patients is 03/07/2021. No other Provider in office is willing to continue to prescribe this medication, and that she will need to establish care in a Pain Management facility. Pt voices understanding, and says that she will schedule an appointment closer to her home.

## 2021-02-08 ENCOUNTER — Other Ambulatory Visit: Payer: Self-pay

## 2021-02-08 ENCOUNTER — Ambulatory Visit (INDEPENDENT_AMBULATORY_CARE_PROVIDER_SITE_OTHER): Payer: Self-pay | Admitting: Family Medicine

## 2021-02-08 ENCOUNTER — Encounter: Payer: Self-pay | Admitting: Family Medicine

## 2021-02-08 VITALS — BP 114/70 | HR 109 | Temp 98.0°F | Ht 65.0 in | Wt 202.4 lb

## 2021-02-08 DIAGNOSIS — G8929 Other chronic pain: Secondary | ICD-10-CM

## 2021-02-08 DIAGNOSIS — F341 Dysthymic disorder: Secondary | ICD-10-CM

## 2021-02-08 DIAGNOSIS — M545 Low back pain, unspecified: Secondary | ICD-10-CM

## 2021-02-08 MED ORDER — SERTRALINE HCL 100 MG PO TABS
2.0000 | ORAL_TABLET | Freq: Every day | ORAL | 2 refills | Status: DC
Start: 2021-02-08 — End: 2021-04-25
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
## 2021-02-08 NOTE — Patient Instructions (Signed)
-  need MRI of your back...   -will keep you at 1.5pills with goal to wean down to every 5 hours.  -hang in there  -really try to exercise and lose weight as well!  So good seeing you! Dr. Artis Flock

## 2021-02-10 ENCOUNTER — Other Ambulatory Visit: Payer: Self-pay | Admitting: Family Medicine

## 2021-02-10 MED ORDER — OXYCODONE HCL 5 MG PO TABS: ORAL_TABLET | ORAL | 0 refills | Status: DC

## 2021-02-10 NOTE — Progress Notes (Signed)
Sending in a month supply for her. Discussed needs to find new provider who will work on weaning her off medication. She is addicted and gets up in the middle of the night to take her pain meds.   -must get MRI of back. Has not gone to pain management despite multiple referrals.   Dr. Artis Flock

## 2021-03-09 ENCOUNTER — Other Ambulatory Visit: Payer: Self-pay | Admitting: Family Medicine

## 2021-03-09 MED ORDER — OXYCODONE HCL 5 MG PO TABS: ORAL_TABLET | ORAL | 0 refills | Status: DC

## 2021-03-09 NOTE — Telephone Encounter (Signed)
30 day supply sent in. She needs to have a TOC visit set up ASAP.  Rachel [name]. Jimmey Ralph, MD 03/09/2021 4:13 PM

## 2021-03-09 NOTE — Telephone Encounter (Signed)
Pt is requesting Oxycodone 5 mg tab @ 3:26 am  LOV: 02/08/2021 No future visits scheduled  Last refill: 02/10/2021  Please Advise.

## 2021-03-09 NOTE — Telephone Encounter (Signed)
Please call pt and schedule TOC visit ASAP per Dr. Jimmey Ralph.

## 2021-03-14 NOTE — Telephone Encounter (Signed)
LVM for patient to call back and schedule a TOC for further refills

## 2021-04-25 ENCOUNTER — Other Ambulatory Visit: Payer: Self-pay

## 2021-04-25 ENCOUNTER — Other Ambulatory Visit: Payer: Self-pay | Admitting: Family Medicine

## 2021-04-25 DIAGNOSIS — F341 Dysthymic disorder: Secondary | ICD-10-CM

## 2021-05-12 ENCOUNTER — Encounter: Payer: Self-pay | Admitting: Family Medicine

## 2021-05-12 ENCOUNTER — Telehealth (INDEPENDENT_AMBULATORY_CARE_PROVIDER_SITE_OTHER): Payer: Self-pay | Admitting: Family Medicine

## 2021-05-12 DIAGNOSIS — J40 Bronchitis, not specified as acute or chronic: Secondary | ICD-10-CM

## 2021-05-12 DIAGNOSIS — G8929 Other chronic pain: Secondary | ICD-10-CM

## 2021-05-12 DIAGNOSIS — M545 Low back pain, unspecified: Secondary | ICD-10-CM

## 2021-05-12 MED ORDER — AZITHROMYCIN 250 MG PO TABS: ORAL_TABLET | ORAL | 0 refills | Status: DC

## 2021-05-12 MED ORDER — PROMETHAZINE-DM 6.25-15 MG/5ML PO SYRP: 5.0000 mL | ORAL_SOLUTION | Freq: Four times a day (QID) | ORAL | 0 refills | Status: DC | PRN

## 2021-05-12 MED ORDER — PREDNISONE 20 MG PO TABS: 40.0000 mg | ORAL_TABLET | Freq: Every day | ORAL | 0 refills | Status: AC

## 2021-05-12 MED ORDER — OXYCODONE HCL 5 MG PO TABS: ORAL_TABLET | ORAL | 0 refills | Status: DC
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
## 2021-06-06 ENCOUNTER — Other Ambulatory Visit: Payer: Self-pay | Admitting: Family Medicine

## 2021-06-06 DIAGNOSIS — G8929 Other chronic pain: Secondary | ICD-10-CM

## 2021-06-06 MED ORDER — OXYCODONE HCL 5 MG PO TABS: ORAL_TABLET | ORAL | 0 refills | Status: DC

## 2021-06-18 ENCOUNTER — Other Ambulatory Visit: Payer: Self-pay | Admitting: Family Medicine

## 2021-06-18 DIAGNOSIS — F341 Dysthymic disorder: Secondary | ICD-10-CM

## 2021-07-05 ENCOUNTER — Other Ambulatory Visit: Payer: Self-pay | Admitting: Family Medicine

## 2021-07-05 DIAGNOSIS — G8929 Other chronic pain: Secondary | ICD-10-CM

## 2021-07-05 MED ORDER — OXYCODONE HCL 5 MG PO TABS: ORAL_TABLET | ORAL | 0 refills | Status: DC

## 2021-07-19 ENCOUNTER — Encounter: Payer: Medicaid Other | Admitting: Family Medicine

## 2021-08-03 ENCOUNTER — Other Ambulatory Visit: Payer: Self-pay | Admitting: Family Medicine

## 2021-08-03 ENCOUNTER — Encounter: Payer: Self-pay | Admitting: Physical Medicine and Rehabilitation

## 2021-08-03 ENCOUNTER — Other Ambulatory Visit: Payer: Self-pay

## 2021-08-03 DIAGNOSIS — G8929 Other chronic pain: Secondary | ICD-10-CM

## 2021-08-03 MED ORDER — OXYCODONE HCL 5 MG PO TABS: ORAL_TABLET | ORAL | 0 refills | Status: DC

## 2021-08-28 ENCOUNTER — Encounter: Payer: Self-pay | Admitting: Family Medicine

## 2021-08-28 ENCOUNTER — Other Ambulatory Visit: Payer: Self-pay | Admitting: Family Medicine

## 2021-08-28 ENCOUNTER — Other Ambulatory Visit: Payer: Self-pay

## 2021-08-28 ENCOUNTER — Telehealth (INDEPENDENT_AMBULATORY_CARE_PROVIDER_SITE_OTHER): Payer: Self-pay | Admitting: Family Medicine

## 2021-08-28 DIAGNOSIS — M545 Low back pain, unspecified: Secondary | ICD-10-CM

## 2021-08-28 DIAGNOSIS — G8929 Other chronic pain: Secondary | ICD-10-CM

## 2021-08-28 DIAGNOSIS — F411 Generalized anxiety disorder: Secondary | ICD-10-CM

## 2021-08-28 DIAGNOSIS — F339 Major depressive disorder, recurrent, unspecified: Secondary | ICD-10-CM

## 2021-08-28 DIAGNOSIS — R41840 Attention and concentration deficit: Secondary | ICD-10-CM | POA: Insufficient documentation

## 2021-08-28 MED ORDER — OXYCODONE HCL 5 MG PO TABS: ORAL_TABLET | ORAL | 0 refills | Status: DC

## 2021-08-28 MED ORDER — BUSPIRONE HCL 7.5 MG PO TABS: 7.5000 mg | ORAL_TABLET | Freq: Two times a day (BID) | ORAL | 2 refills | Status: DC

## 2021-08-28 NOTE — Progress Notes (Signed)
Chief Complaint  Patient presents with   Follow-up    Lack pf motivation    Subjective Rachel [name] presents for f/u anxiety/depression. Due to COVID-19 pandemic, we are interacting via web portal for an electronic face-to-face visit. I verified patient's ID using 2 identifiers. Patient agreed to proceed with visit via this method. Patient is at home, I am at office. Patient and I are present for visit.   Pt is currently being treated with Zoloft 200 mg/d. This has been increased steadily over the past 2 years.  Reports doing OK since treatment.  Depression controlled overall Anxiety has gotten worse over past couple years.  Over past couple years, worsening motivation/focus.  +hx of procrastination and focus issues.  Brother is on ADHD meds.  No thoughts of harming self or others. No self-medication with alcohol, prescription drugs or illicit drugs. Pt is not following with a counselor/psychologist.  Past Medical History:  Diagnosis Date   Allergy    Anxiety    Back pain    Depression    Fever blister 02/12/2016   Migraines    Allergies as of 08/28/2021       Reactions   Penicillins Anaphylaxis   Sulfa Antibiotics Rash        Medication List        Accurate as of August 28, 2021  4:28 PM. If you have any questions, ask your nurse or doctor.          busPIRone 7.5 MG tablet Commonly known as: BUSPAR Take 1 tablet (7.5 mg total) by mouth 2 (two) times daily. Started by: Sharlene Dory, DO   cholecalciferol 25 MCG (1000 UNIT) tablet Commonly known as: VITAMIN D3 Take 1,000 Units by mouth daily.   cyanocobalamin 1000 MCG/ML injection Commonly known as: (VITAMIN B-12) 1 ml ( ) [place] daily x 7 days then weekly x 4 weeks then once/month   ibuprofen 200 MG tablet Commonly known as: ADVIL Take 200 mg by mouth every 6 (six) hours as needed.   oxyCODONE 5 MG immediate release tablet Commonly known as: Oxy IR/ROXICODONE Take 1.5 tablets as  needed every 6 hours for pain. Must last one month Start taking on: September 27, 2021 What changed: These instructions start on September 27, 2021. If you are unsure what to do until then, ask your doctor or other care provider. Changed by: Sharlene Dory, DO   oxyCODONE 5 MG immediate release tablet Commonly known as: Oxy IR/ROXICODONE Take 1.5 tabs every 6 hours as needed for pain. Start taking on: October 02, 2021 What changed: These instructions start on October 02, 2021. If you are unsure what to do until then, ask your doctor or other care provider. Changed by: Sharlene Dory, DO   oxyCODONE 5 MG immediate release tablet Commonly known as: Oxy IR/ROXICODONE Take 1.5 tabs every 6 hours as needed for pain. Start taking on: October 27, 2021 What changed: These instructions start on October 27, 2021. If you are unsure what to do until then, ask your doctor or other care provider. Changed by: Sharlene Dory, DO   sertraline 100 MG tablet Commonly known as: ZOLOFT TAKE 2 TABLETS BY MOUTH EVERY DAY   SYRINGE 3CC/25GX1" 25G X 1" 3 ML Misc 1 Device by Does not apply route as directed.   Vitamin D (Ergocalciferol) 1.25 MG (50000 UNIT) Caps capsule Commonly known as: DRISDOL One capsule by mouth once a week for 12 weeks. Then take 2000IU/day  Exam No conversational dyspnea Age appropriate judgment and insight Nml affect and mood  Assessment and Plan  Depression, recurrent (HCC)  Inattention - Plan: busPIRone (BUSPAR) 7.5 MG tablet  GAD (generalized anxiety disorder) - Plan: busPIRone (BUSPAR) 7.5 MG tablet  Chronic low back pain (Location of Primary Source of Pain) (Bilateral) (R>L) - Plan: oxyCODONE (OXY IR/ROXICODONE) 5 MG immediate release tablet  Chronic, stable. Cont Zoloft 200 mg/d. Chronic, unstable. Refer for ADHD eval. I think BuSpar will help with anxiety and thus this. Cont SSRI as above, add BuSpar 7.5 mg bid.  F/u in 1  mo. The patient voiced understanding and agreement to the plan.  Jilda Roche [place], DO 08/28/21 4:28 PM

## 2021-08-28 NOTE — Addendum Note (Signed)
Addended by: Radene Gunning on: 08/28/2021 04:40 PM   Modules accepted: Orders

## 2021-08-29 ENCOUNTER — Other Ambulatory Visit: Payer: Self-pay | Admitting: Family Medicine

## 2021-08-29 MED ORDER — OXYCODONE HCL 5 MG PO TABS: ORAL_TABLET | ORAL | 0 refills | Status: DC

## 2021-09-07 ENCOUNTER — Other Ambulatory Visit: Payer: Self-pay | Admitting: Family Medicine

## 2021-09-07 DIAGNOSIS — F341 Dysthymic disorder: Secondary | ICD-10-CM

## 2021-09-11 ENCOUNTER — Encounter: Payer: Self-pay | Admitting: Family Medicine

## 2021-09-16 ENCOUNTER — Ambulatory Visit: Admit: 2021-09-16 | Discharge status: Against Medical Advice | Payer: Medicaid Other

## 2021-09-19 ENCOUNTER — Telehealth (INDEPENDENT_AMBULATORY_CARE_PROVIDER_SITE_OTHER): Payer: Self-pay | Admitting: Family Medicine

## 2021-09-19 ENCOUNTER — Encounter: Payer: Self-pay | Admitting: Family Medicine

## 2021-09-19 DIAGNOSIS — J40 Bronchitis, not specified as acute or chronic: Secondary | ICD-10-CM

## 2021-09-19 DIAGNOSIS — G8929 Other chronic pain: Secondary | ICD-10-CM

## 2021-09-19 DIAGNOSIS — M545 Low back pain, unspecified: Secondary | ICD-10-CM

## 2021-09-19 MED ORDER — BENZONATATE 200 MG PO CAPS: 200.0000 mg | ORAL_CAPSULE | Freq: Two times a day (BID) | ORAL | 0 refills | Status: DC | PRN

## 2021-09-19 MED ORDER — AZITHROMYCIN 250 MG PO TABS: ORAL_TABLET | ORAL | 0 refills | Status: DC

## 2021-09-19 MED ORDER — PREDNISONE 20 MG PO TABS: 40.0000 mg | ORAL_TABLET | Freq: Every day | ORAL | 0 refills | Status: AC

## 2021-09-19 MED ORDER — OXYCODONE HCL 5 MG PO TABS: ORAL_TABLET | ORAL | 0 refills | Status: DC

## 2021-09-19 NOTE — Progress Notes (Signed)
Chief Complaint  Patient presents with   Cough    Rachel [name] here for URI complaints. Due to COVID-19 pandemic, we are interacting via web portal for an electronic face-to-face visit. I verified patient's ID using 2 identifiers. Patient agreed to proceed with visit via this method. Patient is at home, I am at office. Patient and I are present for visit.   Duration: 1 week  Associated symptoms: wheezing, shortness of breath, myalgia, and coughing Denies: sinus congestion, sinus pain, rhinorrhea, itchy watery eyes, ear fullness, ear pain, ear drainage, sore throat, and fevers Treatment to date: Tessalon Perles Sick contacts: Yes; son  Past Medical History:  Diagnosis Date   Allergy    Anxiety    Back pain    Depression    Fever blister 02/12/2016   Migraines     Objective No conversational dyspnea Age appropriate judgment and insight Nml affect and mood  Wheezy bronchitis - Plan: azithromycin (ZITHROMAX) 250 MG tablet, predniSONE (DELTASONE) 20 MG tablet, benzonatate (TESSALON) 200 MG capsule  Chronic low back pain (Location of Primary Source of Pain) (Bilateral) (R>L) - Plan: oxyCODONE (OXY IR/ROXICODONE) 5 MG immediate release tablet  5 d pred burst, Zpak if no improvement in 3 d. Tessalon Perles prn.  Will refill chronic pain meds early for this mo only, re-ordered w message to pharm allowing this.  Continue to push fluids, practice good hand hygiene, cover mouth when coughing. F/u prn. If starting to experience fevers, shaking, or shortness of breath, seek immediate care. Pt voiced understanding and agreement to the plan.  Jilda Roche [place], DO 09/19/21 3:25 PM

## 2021-10-20 ENCOUNTER — Encounter: Payer: Self-pay | Admitting: Family Medicine

## 2021-10-20 ENCOUNTER — Other Ambulatory Visit: Payer: Self-pay | Admitting: Family Medicine

## 2021-10-20 ENCOUNTER — Telehealth: Payer: Self-pay | Admitting: Family Medicine

## 2021-10-20 DIAGNOSIS — M545 Low back pain, unspecified: Secondary | ICD-10-CM

## 2021-10-20 DIAGNOSIS — G8929 Other chronic pain: Secondary | ICD-10-CM

## 2021-10-20 NOTE — Telephone Encounter (Signed)
A bit early, place reminder for 5 more days. Ty.

## 2021-10-20 NOTE — Telephone Encounter (Signed)
Requesting: oxycodone 5mg   Contract: None UDS: 08/01/2020 Last Visit: 09/19/2021 Next Visit: None Last Refill: 09/19/2021 #180 and 0RF Pt sig: 1.5 tab q6h prn  Please Advise

## 2021-10-20 NOTE — Telephone Encounter (Signed)
I dont have access nor does Kim as a Merchandiser, retail

## 2021-10-23 NOTE — Telephone Encounter (Signed)
Last OV--09/19/2021 Last RF--09/27/21----#180 no refills

## 2021-10-24 ENCOUNTER — Encounter: Payer: Self-pay | Admitting: Family Medicine

## 2021-11-15 ENCOUNTER — Other Ambulatory Visit: Payer: Self-pay | Admitting: Family Medicine

## 2021-11-15 ENCOUNTER — Telehealth (INDEPENDENT_AMBULATORY_CARE_PROVIDER_SITE_OTHER): Payer: Self-pay | Admitting: Family Medicine

## 2021-11-15 ENCOUNTER — Encounter: Payer: Self-pay | Admitting: Family Medicine

## 2021-11-15 DIAGNOSIS — G8929 Other chronic pain: Secondary | ICD-10-CM

## 2021-11-15 DIAGNOSIS — R41840 Attention and concentration deficit: Secondary | ICD-10-CM

## 2021-11-15 DIAGNOSIS — M545 Low back pain, unspecified: Secondary | ICD-10-CM

## 2021-11-15 DIAGNOSIS — F411 Generalized anxiety disorder: Secondary | ICD-10-CM

## 2021-11-15 DIAGNOSIS — F339 Major depressive disorder, recurrent, unspecified: Secondary | ICD-10-CM

## 2021-11-15 MED ORDER — PREGABALIN 50 MG PO CAPS: 50.0000 mg | ORAL_CAPSULE | Freq: Two times a day (BID) | ORAL | 1 refills | Status: DC

## 2021-11-15 MED ORDER — MIRTAZAPINE 15 MG PO TABS: 15.0000 mg | ORAL_TABLET | Freq: Every day | ORAL | 2 refills | Status: DC

## 2021-11-15 MED ORDER — OXYCODONE HCL 5 MG PO TABS: ORAL_TABLET | ORAL | 0 refills | Status: DC

## 2021-11-15 NOTE — Progress Notes (Signed)
Chief Complaint  Patient presents with   Anxiety    Subjective TERITA HEJL presents for f/u anxiety/depression. Due to COVID-19 pandemic, we are interacting via web portal for an electronic face-to-face visit. I verified patient's ID using 2 identifiers. Patient agreed to proceed with visit via this method. Patient is at home, I am at office. Patient and I are present for visit.   Pt is currently being treated with Zoloft 200 mg/d.  Reports doing OK since treatment. BuSpar made her irritable and "like a zombie" w less motivation and sleepy.  No thoughts of harming self or others. No self-medication with alcohol, prescription drugs or illicit drugs. Pt is following with a counselor/psychologist.  Hx of chronic low back pain on narcotics. Was set up to see pain management but odes not have ins. Hoping to wean off of it. Currently taking oxycodone 7.5 mg QID prn pain. No AE's.   Past Medical History:  Diagnosis Date   Allergy    Anxiety    Back pain    Depression    Fever blister 02/12/2016   Migraines    Allergies as of 11/15/2021       Reactions   Penicillins Anaphylaxis   Sulfa Antibiotics Rash        Medication List        Accurate as of November 15, 2021 12:57 PM. If you have any questions, ask your nurse or doctor.          STOP taking these medications    busPIRone 7.5 MG tablet Commonly known as: BUSPAR Stopped by: Sharlene Dory, DO       TAKE these medications    cholecalciferol 25 MCG (1000 UNIT) tablet Commonly known as: VITAMIN D3 Take 1,000 Units by mouth daily.   cyanocobalamin 1000 MCG/ML injection Commonly known as: (VITAMIN B-12) 1 ml ( ) [place] daily x 7 days then weekly x 4 weeks then once/month   ibuprofen 200 MG tablet Commonly known as: ADVIL Take 200 mg by mouth every 6 (six) hours as needed.   mirtazapine 15 MG tablet Commonly known as: REMERON Take 1 tablet (15 mg total) by mouth at bedtime. Started by:  Sharlene Dory, DO   oxyCODONE 5 MG immediate release tablet Commonly known as: Oxy IR/ROXICODONE TAKE 1 and 1/2 TABLETS BY MOUTH EVERY 8 HOURS AS NEEDED FOR PAIN. What changed: additional instructions Changed by: Sharlene Dory, DO   pregabalin 50 MG capsule Commonly known as: Lyrica Take 1 capsule (50 mg total) by mouth 2 (two) times daily. Started by: Sharlene Dory, DO   sertraline 100 MG tablet Commonly known as: ZOLOFT TAKE 2 TABLETS BY MOUTH EVERY DAY   SYRINGE 3CC/25GX1" 25G X 1" 3 ML Misc 1 Device by Does not apply route as directed.   Vitamin D (Ergocalciferol) 1.25 MG (50000 UNIT) Caps capsule Commonly known as: DRISDOL One capsule by mouth once a week for 12 weeks. Then take 2000IU/day        Exam No conversational dyspnea Age appropriate judgment and insight Nml affect and mood  Assessment and Plan  Depression, recurrent (HCC)  GAD (generalized anxiety disorder) - Plan: mirtazapine (REMERON) 15 MG tablet  Inattention  Chronic low back pain (Location of Primary Source of Pain) (Bilateral) (R>L) - Plan: pregabalin (LYRICA) 50 MG capsule, oxyCODONE (OXY IR/ROXICODONE) 5 MG immediate release tablet  1/2. Chronic, unstable. Stop BuSpar. Cont Zoloft 200 mg/d. Add Remeron 15 mg qhs. F/u in 1 mo. 3. Chronic, unstable.  Add Lyrica 50 mg bid. Decreased freq of oxy from 75 mg qid to tid. F/u in 1 mo. OK to cancel appt w pain for now, hopefully will have ins in 3 mo.  The patient voiced understanding and agreement to the plan.  Jilda Roche [place], DO 11/15/21 12:57 PM

## 2021-11-23 ENCOUNTER — Encounter: Payer: Self-pay | Attending: Physical Medicine and Rehabilitation | Admitting: Physical Medicine and Rehabilitation

## 2021-11-28 ENCOUNTER — Encounter: Payer: Self-pay | Admitting: Family Medicine

## 2021-12-07 ENCOUNTER — Other Ambulatory Visit: Payer: Self-pay | Admitting: Family Medicine

## 2021-12-07 ENCOUNTER — Encounter: Payer: Self-pay | Admitting: Family Medicine

## 2021-12-07 DIAGNOSIS — G8929 Other chronic pain: Secondary | ICD-10-CM

## 2021-12-07 DIAGNOSIS — M545 Low back pain, unspecified: Secondary | ICD-10-CM

## 2021-12-07 MED ORDER — OXYCODONE HCL 5 MG PO TABS: ORAL_TABLET | ORAL | 0 refills | Status: DC

## 2021-12-07 NOTE — Telephone Encounter (Signed)
Pt called regarding pharmacy transfer to alternate pharmacy this month!  ?

## 2021-12-07 NOTE — Telephone Encounter (Signed)
Requesting: oxycodone ?Contract: none ?UDS: none ?Last Visit: 11/15/21 ?Next Visit: none ?Last Refill: 11/15/21 ? ?Please Advise ? ?

## 2021-12-11 ENCOUNTER — Emergency Department
Admission: EM | Admit: 2021-12-11 | Discharge: 2021-12-11 | Disposition: A | Discharge status: Home / Self Care | Payer: Self-pay | Attending: Emergency Medicine | Admitting: Emergency Medicine

## 2021-12-11 ENCOUNTER — Emergency Department: Payer: Self-pay

## 2021-12-11 ENCOUNTER — Encounter: Payer: Self-pay | Admitting: Emergency Medicine

## 2021-12-11 ENCOUNTER — Other Ambulatory Visit: Payer: Self-pay

## 2021-12-11 DIAGNOSIS — M545 Low back pain, unspecified: Secondary | ICD-10-CM | POA: Insufficient documentation

## 2021-12-11 DIAGNOSIS — E871 Hypo-osmolality and hyponatremia: Secondary | ICD-10-CM | POA: Insufficient documentation

## 2021-12-11 DIAGNOSIS — F32A Depression, unspecified: Secondary | ICD-10-CM | POA: Insufficient documentation

## 2021-12-11 DIAGNOSIS — E876 Hypokalemia: Secondary | ICD-10-CM | POA: Insufficient documentation

## 2021-12-11 DIAGNOSIS — B349 Viral infection, unspecified: Secondary | ICD-10-CM | POA: Insufficient documentation

## 2021-12-11 DIAGNOSIS — G8929 Other chronic pain: Secondary | ICD-10-CM | POA: Insufficient documentation

## 2021-12-11 DIAGNOSIS — Z20822 Contact with and (suspected) exposure to covid-19: Secondary | ICD-10-CM | POA: Insufficient documentation

## 2021-12-11 LAB — CBC WITH DIFFERENTIAL/PLATELET
Abs Immature Granulocytes: 0.02 10*3/uL (ref 0.00–0.07)
Basophils Absolute: 0 10*3/uL (ref 0.0–0.1)
Basophils Relative: 0 %
Eosinophils Absolute: 0 10*3/uL (ref 0.0–0.5)
Eosinophils Relative: 1 %
HCT: 38.9 % (ref 36.0–46.0)
Hemoglobin: 12.2 g/dL (ref 12.0–15.0)
Immature Granulocytes: 0 %
Lymphocytes Relative: 21 %
Lymphs Abs: 1 10*3/uL (ref 0.7–4.0)
MCH: 25.8 pg — ABNORMAL LOW (ref 26.0–34.0)
MCHC: 31.4 g/dL (ref 30.0–36.0)
MCV: 82.2 fL (ref 80.0–100.0)
Monocytes Absolute: 0.3 10*3/uL (ref 0.1–1.0)
Monocytes Relative: 6 %
Neutro Abs: 3.6 10*3/uL (ref 1.7–7.7)
Neutrophils Relative %: 72 %
Platelets: 183 10*3/uL (ref 150–400)
RBC: 4.73 MIL/uL (ref 3.87–5.11)
RDW: 15 % (ref 11.5–15.5)
WBC: 5 10*3/uL (ref 4.0–10.5)
nRBC: 0 % (ref 0.0–0.2)

## 2021-12-11 LAB — COMPREHENSIVE METABOLIC PANEL
ALT: 19 U/L (ref 0–44)
AST: 20 U/L (ref 15–41)
Albumin: 3.8 g/dL (ref 3.5–5.0)
Alkaline Phosphatase: 48 U/L (ref 38–126)
Anion gap: 9 (ref 5–15)
BUN: 9 mg/dL (ref 6–20)
CO2: 27 mmol/L (ref 22–32)
Calcium: 8.5 mg/dL — ABNORMAL LOW (ref 8.9–10.3)
Chloride: 98 mmol/L (ref 98–111)
Creatinine, Ser: 0.65 mg/dL (ref 0.44–1.00)
GFR, Estimated: >60 mL/min (ref >60)
Glucose, Bld: 97 mg/dL (ref 70–99)
Potassium: 3.1 mmol/L — ABNORMAL LOW (ref 3.5–5.1)
Sodium: 134 mmol/L — ABNORMAL LOW (ref 135–145)
Total Bilirubin: 0.3 mg/dL (ref 0.3–1.2)
Total Protein: 7.2 g/dL (ref 6.5–8.1)

## 2021-12-11 LAB — URINALYSIS, ROUTINE W REFLEX MICROSCOPIC
Bilirubin Urine: NEGATIVE
Glucose, UA: NEGATIVE mg/dL
Hgb urine dipstick: NEGATIVE
Ketones, ur: NEGATIVE mg/dL
Leukocytes,Ua: NEGATIVE
Nitrite: NEGATIVE
Protein, ur: NEGATIVE mg/dL
Specific Gravity, Urine: 1.011 (ref 1.005–1.030)
pH: 7 (ref 5.0–8.0)

## 2021-12-11 LAB — RESP PANEL BY RT-PCR (FLU A&B, COVID) ARPGX2
Influenza A by PCR: NEGATIVE
Influenza B by PCR: NEGATIVE
SARS Coronavirus 2 by RT PCR: NEGATIVE

## 2021-12-11 LAB — POC URINE PREG, ED: Preg Test, Ur: NEGATIVE

## 2021-12-11 MED ORDER — ONDANSETRON 4 MG PO TBDP
4.0000 mg | ORAL_TABLET | Freq: Once | ORAL | Status: AC
  Administered 2021-12-11: 4 mg via ORAL
  Filled 2021-12-11: qty 1

## 2021-12-11 MED ORDER — ONDANSETRON 4 MG PO TBDP
4.0000 mg | ORAL_TABLET | Freq: Three times a day (TID) | ORAL | 0 refills | Status: AC | PRN
Start: 2021-12-11 — End: 2021-12-14

## 2021-12-11 MED ORDER — ACETAMINOPHEN 500 MG PO TABS
1000.0000 mg | ORAL_TABLET | Freq: Once | ORAL | Status: AC
  Administered 2021-12-11: 1000 mg via ORAL
  Filled 2021-12-11: qty 2

## 2021-12-11 MED ORDER — KETOROLAC TROMETHAMINE 15 MG/ML IJ SOLN
15.0000 mg | Freq: Once | INTRAMUSCULAR | Status: AC
  Administered 2021-12-11: 15 mg via INTRAMUSCULAR
  Filled 2021-12-11: qty 1

## 2021-12-11 MED ORDER — POTASSIUM CHLORIDE CRYS ER 20 MEQ PO TBCR
40.0000 meq | EXTENDED_RELEASE_TABLET | Freq: Once | ORAL | Status: AC
  Administered 2021-12-11: 40 meq via ORAL
  Filled 2021-12-11: qty 2

## 2021-12-11 NOTE — Discharge Instructions (Signed)
You can take Tylenol 1 g every 8 hours and ibuprofen 600 every 6-8 hours with food.  We have given you your first doses here.  I prescribed some Zofran to help with nausea.  Return to the ER if you develop abdominal pain but at this time her abdomen is soft and nontender so we will hold off on any imaging I suspect this is most likely viral in nature ?

## 2021-12-11 NOTE — ED Provider Notes (Signed)
? ?[facility] ?Provider Note ? ? ? Event Date/Time  ? First MD Initiated Contact with Patient 12/11/21 1346   ?  (approximate) ? ? ?History  ? ?Chills ? ? ?HPI ? ?Rachel [name] is a 41 y.o. female with chronic low back pain, depression who comes in with multiple symptoms.  Patient reports having sudden onset of vomiting starting yesterday.  She reports that she has been having difficulty keeping food down.  She reports chills and body aches.  She reports a little bit of left flank pain after the vomiting episodes.  She denies any diarrhea or abdominal discomfort.  She reports a little bit of coughing. ?  ? ? ?Physical Exam  ? ?Triage Vital Signs: ?ED Triage Vitals  ?Enc Vitals Group  ?   BP 12/11/21 1335 110/79  ?   Pulse Rate 12/11/21 1335 (!) 104  ?   Resp 12/11/21 1335 16  ?   Temp 12/11/21 1335 98.8 ?F (37.1 ?C)  ?   Temp Source 12/11/21 1335 Oral  ?   SpO2 12/11/21 1335 100 %  ?   Weight 12/11/21 1219 215 lb 9.8 oz (97.8 kg)  ?   Height 12/11/21 1219 5\' 5"  (1.651 m)  ?   Head Circumference --   ?   Peak Flow --   ?   Pain Score 12/11/21 1219 0  ?   Pain Loc --   ?   Pain Edu? --   ?   Excl. in GC? --   ? ? ?Most recent vital signs: ?Vitals:  ? 12/11/21 1335  ?BP: 110/79  ?Pulse: (!) 104  ?Resp: 16  ?Temp: 98.8 ?F (37.1 ?C)  ?SpO2: 100%  ? ? ? ?General: Awake, no distress.  ?CV:  Good peripheral perfusion ?Resp:  Normal effort.  ?Abd:  No distention. Soft non tender ? ? ? ?ED Results / Procedures / Treatments  ? ?Labs ?(all labs ordered are listed, but only abnormal results are displayed) ?Labs Reviewed  ?URINALYSIS, ROUTINE W REFLEX MICROSCOPIC - Abnormal; Notable for the following components:  ?    Result Value  ? Color, Urine YELLOW (*)   ? APPearance CLEAR (*)   ? All other components within normal limits  ?CBC WITH DIFFERENTIAL/PLATELET - Abnormal; Notable for the following components:  ? MCH 25.8 (*)   ? All other components within normal limits  ?COMPREHENSIVE METABOLIC  PANEL - Abnormal; Notable for the following components:  ? Sodium 134 (*)   ? Potassium 3.1 (*)   ? Calcium 8.5 (*)   ? All other components within normal limits  ?RESP PANEL BY RT-PCR (FLU A&B, COVID) ARPGX2  ?POC URINE PREG, ED  ? ? ? ?EKG ? ?My interpretation of EKG: ? ?Normal sinus rate of 86 without any ST elevation, T wave version V3 and lead III, normal intervals ? ?RADIOLOGY ?I have reviewed the xray personally no PNA ? ?PROCEDURES: ? ?Critical Care performed: No ? ?Procedures ? ? ?MEDICATIONS ORDERED IN ED: ?Medications  ?ketorolac (TORADOL) 15 MG/ML injection 15 mg (has no administration in time range)  ?ondansetron (ZOFRAN-ODT) disintegrating tablet 4 mg (has no administration in time range)  ?acetaminophen (TYLENOL) tablet 1,000 mg (has no administration in time range)  ? ? ? ?IMPRESSION / MDM / ASSESSMENT AND PLAN / ED COURSE  ?I reviewed the triage vital signs and the nursing notes. ?             ?               ? ?  Patient comes in with vomiting, left flank discomfort.  We will get urine evaluate for UTI, red cells to evaluate for kidney stone, COVID, flu.  Her abdomen is soft and nontender low suspicion for appendicitis or SBO.  We will get some labs to evaluate for any dehydration and EKG given the dizziness. ? ?Pregnancy test was negative.  UA without evidence of UTI or RBCs to suggest there being a kidney stone.  CBC is normal.  CMP shows slightly low potassium patient will get some oral repletion.  Slightly low sodium.  Patient's heart rate has normalized without any fluids.  COVID and flu are negative. ? ?Repeat evaluation patient reports feeling better.  Repeat abdominal exam is soft and nontender.  Discussed the reassuring work-up I suspect most likely viral illness.  Patient expressed understanding state that she was feeling better and felt comfortable with discharge home and will return if she develops pain in her abdomen or any other concerns ? ?I discussed the provisional nature of ED  diagnosis, the treatment so far, the ongoing plan of care, follow up appointments and return precautions with the patient and any family or support people present. They expressed understanding and agreed with the plan, discharged home. ? ? ? ? ?FINAL CLINICAL IMPRESSION(S) / ED DIAGNOSES  ? ?Final diagnoses:  ?Viral illness  ? ? ? ?Rx / DC Orders  ? ?ED Discharge Orders   ? ?      Ordered  ?  ondansetron (ZOFRAN-ODT) 4 MG disintegrating tablet  Every 8 hours PRN       ? 12/11/21 1532  ? ?  ?  ? ?  ? ? ? ?Note:  This document was prepared using Dragon voice recognition software and may include unintentional dictation errors. ?  ?Concha Se, MD ?12/11/21 1532 ? ?

## 2021-12-11 NOTE — ED Triage Notes (Signed)
C/O chills, body aches, vomiting x 1 since last night.  Also c/o dizziness. ? ?AAOx3.  Skin warm and dry. NAD.  Ambulates with easy and steady gait. ?

## 2021-12-22 ENCOUNTER — Encounter: Payer: Self-pay | Admitting: Family Medicine

## 2021-12-22 ENCOUNTER — Other Ambulatory Visit: Payer: Self-pay | Admitting: Family Medicine

## 2021-12-22 DIAGNOSIS — M545 Low back pain, unspecified: Secondary | ICD-10-CM

## 2021-12-22 DIAGNOSIS — F341 Dysthymic disorder: Secondary | ICD-10-CM

## 2021-12-22 MED ORDER — SERTRALINE HCL 100 MG PO TABS: 200.0000 mg | ORAL_TABLET | Freq: Every day | ORAL | 1 refills | Status: DC

## 2021-12-22 MED ORDER — OXYCODONE HCL 5 MG PO TABS: ORAL_TABLET | ORAL | 0 refills | Status: DC

## 2022-01-14 ENCOUNTER — Other Ambulatory Visit: Payer: Self-pay | Admitting: Family Medicine

## 2022-01-14 DIAGNOSIS — F341 Dysthymic disorder: Secondary | ICD-10-CM

## 2022-01-27 ENCOUNTER — Other Ambulatory Visit: Payer: Self-pay | Admitting: Family Medicine

## 2022-01-27 DIAGNOSIS — G8929 Other chronic pain: Secondary | ICD-10-CM

## 2022-01-29 ENCOUNTER — Other Ambulatory Visit: Payer: Self-pay | Admitting: Family Medicine

## 2022-01-29 DIAGNOSIS — G8929 Other chronic pain: Secondary | ICD-10-CM

## 2022-01-29 MED ORDER — OXYCODONE HCL 5 MG PO TABS: ORAL_TABLET | ORAL | 0 refills | Status: DC

## 2022-01-29 NOTE — Telephone Encounter (Signed)
Called the patient left message to call back 

## 2022-01-29 NOTE — Telephone Encounter (Signed)
[facility] if the pharmacy she prefers (called to clarify with the patient) ?Called [facility] to confirm all refills canceled at their pharmacy ? ? ?

## 2022-01-29 NOTE — Telephone Encounter (Signed)
Last RF--01/06/2022 ?Last OV--2.8.2023 ?

## 2022-01-29 NOTE — Telephone Encounter (Signed)
[facility] rx plz. Plz hve her remind [place] which pharmacy to send to in future. This happens every refill.   ?

## 2022-01-29 NOTE — Telephone Encounter (Signed)
Since is controlled I cannot deny refill.  ?

## 2022-02-07 ENCOUNTER — Other Ambulatory Visit: Payer: Self-pay | Admitting: Family Medicine

## 2022-02-07 DIAGNOSIS — F341 Dysthymic disorder: Secondary | ICD-10-CM

## 2022-03-15 ENCOUNTER — Encounter: Payer: Self-pay | Admitting: Family Medicine

## 2022-03-15 DIAGNOSIS — F411 Generalized anxiety disorder: Secondary | ICD-10-CM

## 2022-03-15 MED ORDER — MIRTAZAPINE 15 MG PO TABS: 15.0000 mg | ORAL_TABLET | Freq: Every day | ORAL | 2 refills | Status: DC

## 2022-03-22 ENCOUNTER — Encounter: Payer: Self-pay | Admitting: Family Medicine

## 2022-03-22 ENCOUNTER — Other Ambulatory Visit: Payer: Self-pay | Admitting: Family Medicine

## 2022-03-22 MED ORDER — OXYCODONE HCL 5 MG PO TABS: ORAL_TABLET | ORAL | 0 refills | Status: DC

## 2022-03-27 ENCOUNTER — Other Ambulatory Visit: Payer: Self-pay | Admitting: Family Medicine

## 2022-03-27 DIAGNOSIS — G8929 Other chronic pain: Secondary | ICD-10-CM

## 2022-03-27 NOTE — Telephone Encounter (Signed)
Called the patient to inform PCP sent in refills for May and June should be a June refill at the pharmacy.  The patient will call to check.

## 2022-04-19 ENCOUNTER — Other Ambulatory Visit: Payer: Self-pay | Admitting: Family Medicine

## 2022-04-19 DIAGNOSIS — G8929 Other chronic pain: Secondary | ICD-10-CM

## 2022-04-19 NOTE — Telephone Encounter (Signed)
Too soon for refill and she is due for in person visit. Ty.

## 2022-04-26 ENCOUNTER — Telehealth (INDEPENDENT_AMBULATORY_CARE_PROVIDER_SITE_OTHER): Payer: Self-pay | Admitting: Family Medicine

## 2022-04-26 ENCOUNTER — Encounter: Payer: Self-pay | Admitting: Family Medicine

## 2022-04-26 DIAGNOSIS — M545 Low back pain, unspecified: Secondary | ICD-10-CM

## 2022-04-26 DIAGNOSIS — G8929 Other chronic pain: Secondary | ICD-10-CM

## 2022-04-26 DIAGNOSIS — F411 Generalized anxiety disorder: Secondary | ICD-10-CM

## 2022-04-26 MED ORDER — OXYCODONE HCL 10 MG PO TABS
5.0000 mg | ORAL_TABLET | Freq: Three times a day (TID) | ORAL | 0 refills | Status: DC | PRN
Start: 2022-06-25 — End: 2022-07-20

## 2022-04-26 MED ORDER — MIRTAZAPINE 7.5 MG PO TABS: 7.5000 mg | ORAL_TABLET | Freq: Every day | ORAL | 3 refills | Status: DC

## 2022-04-26 MED ORDER — OXYCODONE HCL 10 MG PO TABS: 5.0000 mg | ORAL_TABLET | Freq: Three times a day (TID) | ORAL | 0 refills | Status: DC | PRN

## 2022-04-26 NOTE — Progress Notes (Signed)
Chief Complaint  Patient presents with   Medication Refill    Medication Refills     Subjective Rachel [name] presents for f/u anxiety/depression.  Pt is currently being treated with Zoloft 50 mg daily, mirtazapine 7.5 mg nightly as needed.  Reports doing well since treatment. No thoughts of harming self or others. No self-medication with alcohol, prescription drugs or illicit drugs. Pt is not following with a counselor/psychologist.  History of chronic low back pain.  She is currently taking oxycodone 10 mg 3 times daily.  She has no adverse effects.  She was seeing a pain specialist as she was interested in weaning down on her medication but lost insurance and cannot do this at this time.  By 2024, she will to have health insurance again.  She will let me know and we will start the referral process again.  Past Medical History:  Diagnosis Date   Allergy    Anxiety    Back pain    Depression    Fever blister 02/12/2016   Migraines    Allergies as of 04/26/2022       Reactions   Penicillins Anaphylaxis   Sulfa Antibiotics Rash        Medication List        Accurate as of April 26, 2022  1:23 PM. If you have any questions, ask your nurse or doctor.          STOP taking these medications    pregabalin 50 MG capsule Commonly known as: Lyrica Stopped by: Sharlene Dory, DO       TAKE these medications    cholecalciferol 25 MCG (1000 UNIT) tablet Commonly known as: VITAMIN D3 Take 1,000 Units by mouth daily.   cyanocobalamin 1000 MCG/ML injection Commonly known as: (VITAMIN B-12) 1 ml ( ) [place] daily x 7 days then weekly x 4 weeks then once/month   ibuprofen 200 MG tablet Commonly known as: ADVIL Take 200 mg by mouth every 6 (six) hours as needed.   mirtazapine 7.5 MG tablet Commonly known as: REMERON Take 1 tablet (7.5 mg total) by mouth at bedtime. What changed:  medication strength how much to take Changed by: Sharlene Dory, DO   Oxycodone HCl 10 MG Tabs Take 0.5-1 tablets (5-10 mg total) by mouth 3 (three) times daily as needed (Pain). What changed:  medication strength how much to take how to take this when to take this reasons to take this additional instructions Changed by: Sharlene Dory, DO   Oxycodone HCl 10 MG Tabs Take 0.5-1 tablets (5-10 mg total) by mouth 3 (three) times daily as needed (Pain). Start taking on: May 26, 2022 What changed:  medication strength how much to take how to take this when to take this reasons to take this additional instructions These instructions start on May 26, 2022. If you are unsure what to do until then, ask your doctor or other care provider. Changed by: Sharlene Dory, DO   Oxycodone HCl 10 MG Tabs Take 0.5-1 tablets (5-10 mg total) by mouth 3 (three) times daily as needed (Pain). Start taking on: June 25, 2022 What changed:  medication strength how much to take how to take this when to take this reasons to take this additional instructions These instructions start on June 25, 2022. If you are unsure what to do until then, ask your doctor or other care provider. Changed by: Sharlene Dory, DO   sertraline 100 MG tablet Commonly known as:  ZOLOFT TAKE 2 TABLETS BY MOUTH EVERY DAY   SYRINGE 3CC/25GX1" 25G X 1" 3 ML Misc 1 Device by Does not apply route as directed.   Vitamin D (Ergocalciferol) 1.25 MG (50000 UNIT) Caps capsule Commonly known as: DRISDOL One capsule by mouth once a week for 12 weeks. Then take 2000IU/day        Exam There were no vitals taken for this visit. General:  well developed, well nourished, in no apparent distress Lungs:  No respiratory distress Psych: well oriented with normal range of affect and age-appropriate judgement/insight, alert and oriented x4.  Assessment and Plan  GAD (generalized anxiety disorder) - Plan: mirtazapine (REMERON) 7.5 MG  tablet  Chronic low back pain (Location of Primary Source of Pain) (Bilateral) (R>L) - Plan: Oxycodone HCl 10 MG TABS, Oxycodone HCl 10 MG TABS, Oxycodone HCl 10 MG TABS  Chronic, stable.  Continue mirtazapine 7.5 mg nightly as needed, Zoloft 50 mg daily. Chronic, stable.  Continue oxycodone 5-10 mg 3 times daily as needed.  She has no insurance.  When she does get this, we will set her up with a pain specialist, but unfortunately she cannot afford this at this time. F/u in 6 mo. We will update pain contract and CSC. The patient voiced understanding and agreement to the plan.  Jilda Roche [place], DO 04/26/22 1:23 PM

## 2022-05-16 ENCOUNTER — Encounter (INDEPENDENT_AMBULATORY_CARE_PROVIDER_SITE_OTHER): Payer: Self-pay

## 2022-06-14 IMAGING — CR DG CHEST 2V
1 series · 2 of 2 positions shown · non-contrast
Comparison: None.

CLINICAL DATA: Cough

EXAM:
CHEST - 2 VIEW

[Series 1: dg chest 2 view · 0.14mm/px · 2 of 2 slices shown]
[im 1/2]
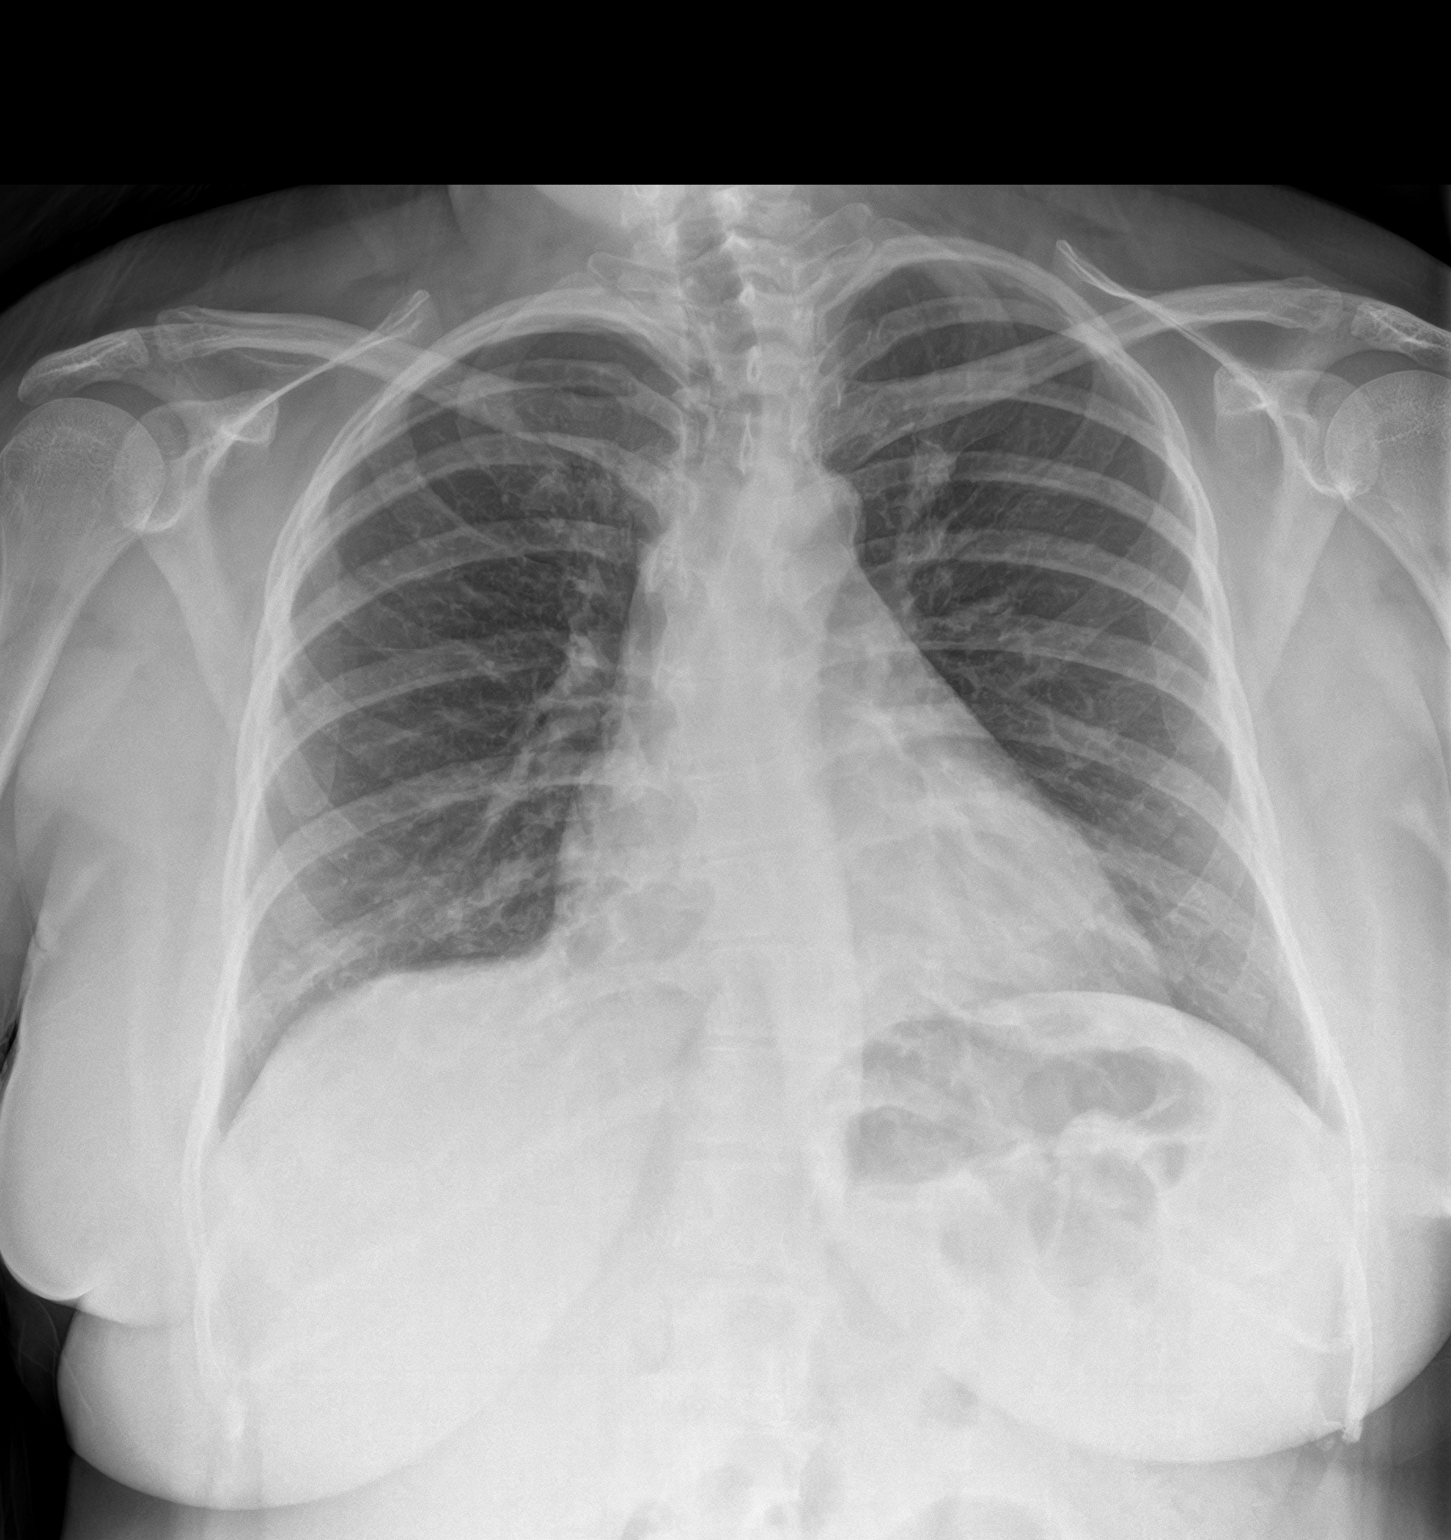
[im 2/2]
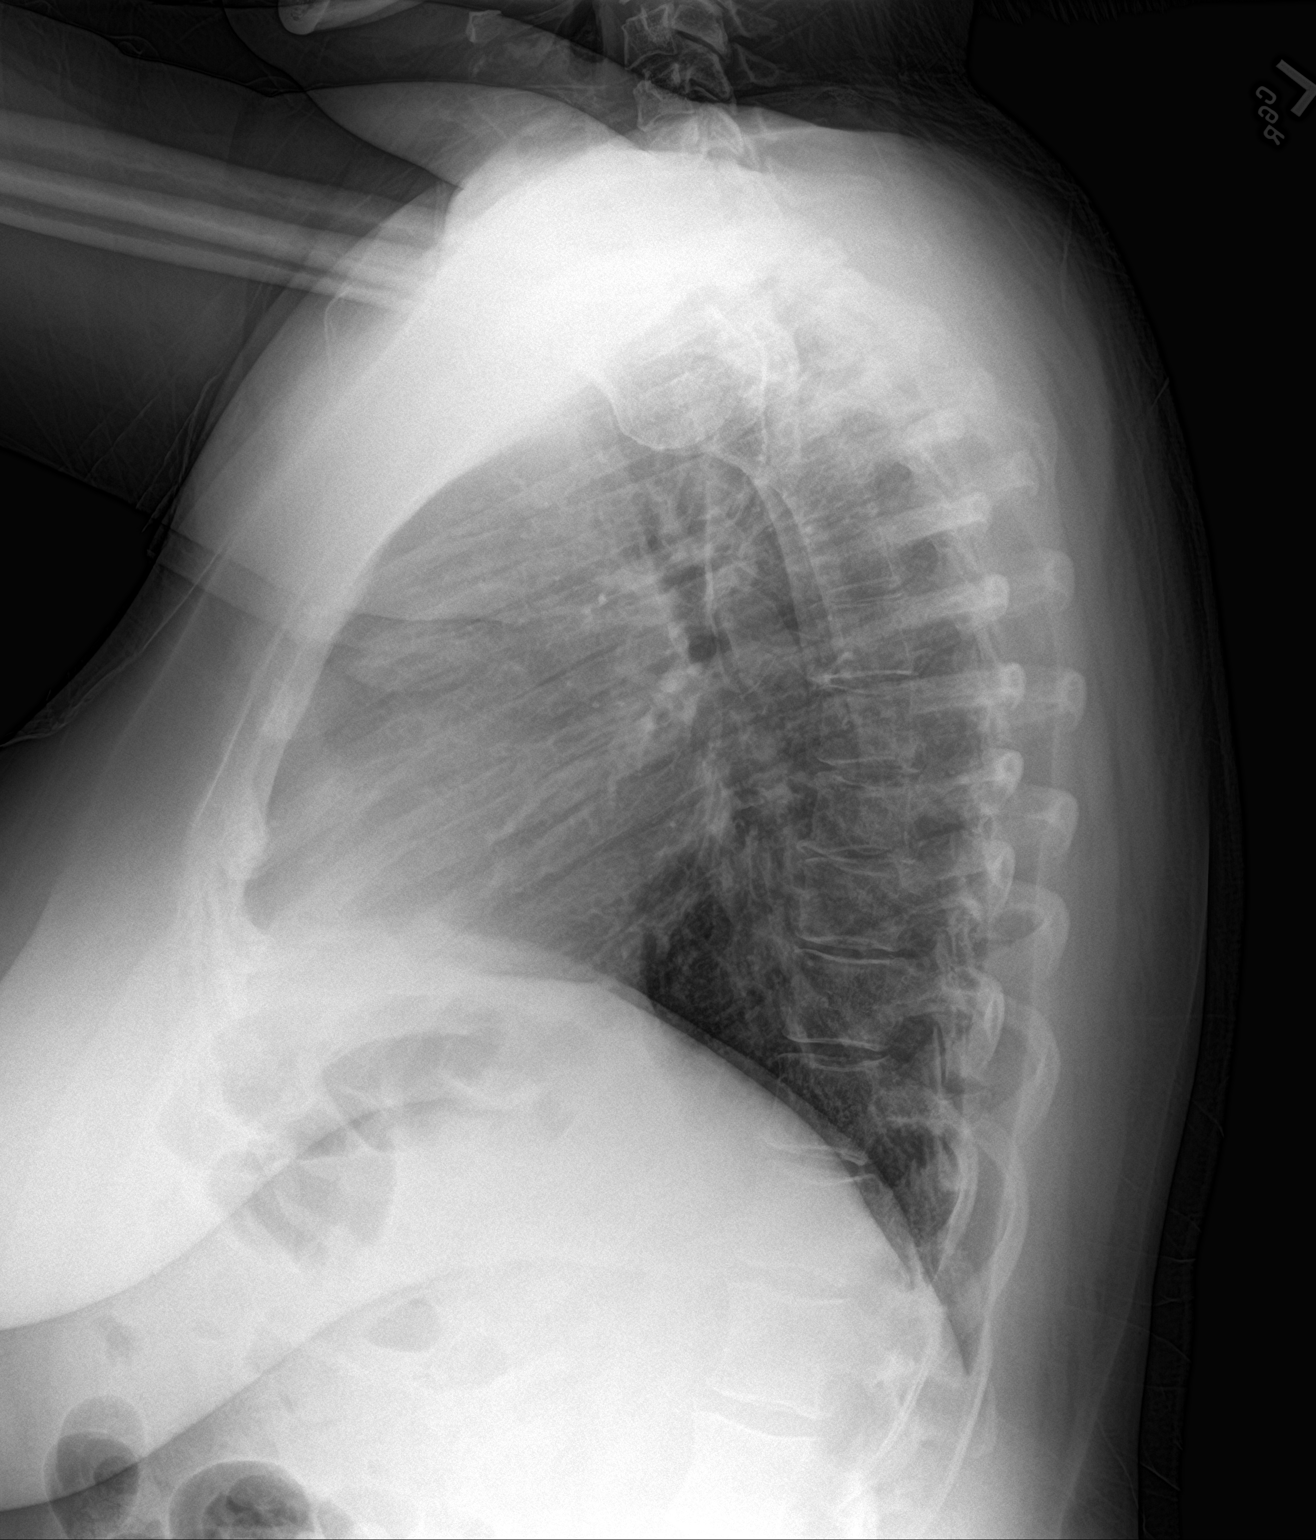

[2 of 2 positions shown; findings below may reference images not displayed]

FINDINGS: The heart size and mediastinal contours are within normal limits.
Both lungs are clear. The visualized skeletal structures are
unremarkable.
IMPRESSION: No evidence of acute cardiopulmonary disease.

## 2022-07-11 ENCOUNTER — Other Ambulatory Visit: Payer: Self-pay | Admitting: Family Medicine

## 2022-07-11 ENCOUNTER — Encounter: Payer: Self-pay | Admitting: Family Medicine

## 2022-07-11 DIAGNOSIS — G8929 Other chronic pain: Secondary | ICD-10-CM

## 2022-07-11 MED ORDER — OXYCODONE HCL 10 MG PO TABS: 5.0000 mg | ORAL_TABLET | Freq: Three times a day (TID) | ORAL | 0 refills | Status: DC | PRN

## 2022-07-20 ENCOUNTER — Encounter: Payer: Self-pay | Admitting: Family Medicine

## 2022-07-20 ENCOUNTER — Telehealth (INDEPENDENT_AMBULATORY_CARE_PROVIDER_SITE_OTHER): Payer: Self-pay | Admitting: Family Medicine

## 2022-07-20 DIAGNOSIS — R41840 Attention and concentration deficit: Secondary | ICD-10-CM

## 2022-07-20 DIAGNOSIS — M545 Low back pain, unspecified: Secondary | ICD-10-CM

## 2022-07-20 DIAGNOSIS — G8929 Other chronic pain: Secondary | ICD-10-CM

## 2022-07-20 MED ORDER — OXYCODONE HCL 10 MG PO TABS: 5.0000 mg | ORAL_TABLET | Freq: Three times a day (TID) | ORAL | 0 refills | Status: DC | PRN

## 2022-07-20 MED ORDER — AMPHETAMINE-DEXTROAMPHET ER 20 MG PO CP24: 20.0000 mg | ORAL_CAPSULE | Freq: Every day | ORAL | 0 refills | Status: DC

## 2022-07-20 NOTE — Progress Notes (Signed)
Chief Complaint  Patient presents with   Follow-up    medication    Subjective: Patient is a 41 y.o. female here for inattention. Due to COVID-19 pandemic, we are interacting via web portal for an electronic face-to-face visit. I verified patient's ID using 2 identifiers. Patient agreed to proceed with visit via this method. Patient is at home, I am at office. Patient and I are present for visit.   Several decades of decreased concentration, questionable hx in elementary school. She has issues with procrastination as well. She is starting a new job in 3 d and would like something to help. Her brother was dx'd w ADHD.   Past Medical History:  Diagnosis Date   Allergy    Anxiety    Back pain    Depression    Fever blister 02/12/2016   Migraines     Objective: No conversational dyspnea Age appropriate judgment and insight Nml affect and mood   Assessment and Plan: Inattention - Plan: amphetamine-dextroamphetamine (ADDERALL XR) 20 MG 24 hr capsule, Ambulatory referral to Psychology  Chronic low back pain (Location of Primary Source of Pain) (Bilateral) (R>L) - Plan: Oxycodone HCl 10 MG TABS  Chronic, uncontrolled. Probably ADHD given famhx and her personal hx. Will start Adderall XR 20 mg/d. Refer to [facility] to formal eval. She starts a new job Mon, will have ins after 90 d so will try to sched this in mid Jan. I will see her in 1 mo for recheck and to give UDS/sign CSC. Chronic, stable. Cont oxy for now, when she has ins, can get her in with a pain clinic.  The patient voiced understanding and agreement to the plan.  [place], DO 07/20/22  8:48 AM

## 2022-08-20 ENCOUNTER — Other Ambulatory Visit: Payer: Self-pay | Admitting: Family Medicine

## 2022-08-20 DIAGNOSIS — R41840 Attention and concentration deficit: Secondary | ICD-10-CM

## 2022-08-20 MED ORDER — AMPHETAMINE-DEXTROAMPHET ER 20 MG PO CP24: 20.0000 mg | ORAL_CAPSULE | Freq: Every day | ORAL | 0 refills | Status: DC

## 2022-08-20 NOTE — Telephone Encounter (Signed)
Last Refill 07/20/2022 Last OV  07/20/2022

## 2022-08-20 NOTE — Telephone Encounter (Signed)
Called informed of PCP instructions. Scheduled in the office appt for this coming Wednesday 08/22/22 at 11:45 AM. She did want to ask that she is out and would like to know if PCP can send in enough to cover her until her appt.

## 2022-08-20 NOTE — Telephone Encounter (Signed)
Called left message prescription sent in

## 2022-08-22 ENCOUNTER — Encounter: Payer: Self-pay | Admitting: Family Medicine

## 2022-08-22 ENCOUNTER — Ambulatory Visit (INDEPENDENT_AMBULATORY_CARE_PROVIDER_SITE_OTHER): Payer: Self-pay | Admitting: Family Medicine

## 2022-08-22 ENCOUNTER — Telehealth: Payer: Self-pay

## 2022-08-22 ENCOUNTER — Other Ambulatory Visit (INDEPENDENT_AMBULATORY_CARE_PROVIDER_SITE_OTHER): Payer: Self-pay | Admitting: Family Medicine

## 2022-08-22 VITALS — BP 108/70 | HR 114 | Temp 97.7°F | Ht 65.0 in | Wt 211.2 lb

## 2022-08-22 DIAGNOSIS — Z79899 Other long term (current) drug therapy: Secondary | ICD-10-CM

## 2022-08-22 DIAGNOSIS — Z23 Encounter for immunization: Secondary | ICD-10-CM

## 2022-08-22 DIAGNOSIS — R41840 Attention and concentration deficit: Secondary | ICD-10-CM

## 2022-08-22 DIAGNOSIS — M545 Low back pain, unspecified: Secondary | ICD-10-CM

## 2022-08-22 DIAGNOSIS — G8929 Other chronic pain: Secondary | ICD-10-CM

## 2022-08-22 MED ORDER — OXYCODONE HCL 5 MG PO TABS: 5.0000 mg | ORAL_TABLET | Freq: Three times a day (TID) | ORAL | 0 refills | Status: DC | PRN

## 2022-08-22 MED ORDER — AMPHETAMINE-DEXTROAMPHETAMINE 10 MG PO TABS: ORAL_TABLET | ORAL | 0 refills | Status: DC

## 2022-08-22 NOTE — Telephone Encounter (Addendum)
I have received a call from Sam from [facility] and informed us that the pt has picked up Oxycodone several times this month.   On Nov 1st she picked up Oxy 10 mg at [facility] for #90  On Nov 2nd she [facility]  Today an RX was sent in for Oxy 5 mg take 2 tabs daily with #180  Adderall 10 mg ER she just got #30 on Nov 3rd Adderall 20mg  ER she got [facility] 08/20/22  Now [facility] [phone]) is questioning the rx of oxy and addrerall that was sent to them today.   Please assist.

## 2022-08-22 NOTE — Telephone Encounter (Signed)
Can we ask her why she picked up a 90 tab supply within 2 days of each other?

## 2022-08-22 NOTE — Progress Notes (Signed)
Chief Complaint  Patient presents with   Follow-up    Medication     Rachel [name] is 41 y.o. female here for ADHD follow up.  Patient is currently on Adderall XR 20 mg/d and compliance is excellent. Symptoms are controlled until 4 PM.  Side effects include: none. Patient believes their dose should be increased. Denies tics, weight loss, difficulties with sleep, self-medication, alcohol/drug abuse, chest pain, or palpitations.   Past Medical History:  Diagnosis Date   Allergy    Anxiety    Back pain    Depression    Fever blister 02/12/2016   Migraines     BP 108/70 (BP Location: Left Arm, Patient Position: Sitting, Cuff Size: Normal)   Pulse (!) 114   Temp 97.7 F (36.5 C) (Oral)   Ht 5\' 5"  (1.651 m)   Wt 211 lb 4 oz (95.8 kg)   SpO2 97%   BMI 35.15 kg/m  Gen- awake, alert, appearing stated age Heart- RRR Lungs- CTAB, no accessory muscle use Neuro- no facial tics Psych- age appropriate judgment and insight, normal mood and affect  Inattention - Plan: amphetamine-dextroamphetamine (ADDERALL) 10 MG tablet  Chronic low back pain without sciatica, unspecified back pain laterality  Encounter for long-term (current) use of high-risk medication - Plan: Drug Monitoring Panel [id number] , Urine  Chronic, not fully controlled.  Continue Adderall XR 20 mg in the morning, and 10 mg Adderall in the afternoon.  If she is not doing well with this, I will see her in 1 month.  Otherwise I will have her f/u in 6 mo for CPE.  UDS and CSC updated today. Pt voiced understanding and agreement to the plan.  [id number] Dozier, DO 08/22/22 11:58 AM

## 2022-08-22 NOTE — Patient Instructions (Signed)
No news is good news with your urine results.   Let us know if you need a refill.   Let us know if you need anything.

## 2022-08-22 NOTE — Telephone Encounter (Signed)
PMP website shows she picked up 2 90 d supplies within 1 d of each other. In light of this, we will cancel the Dec rx and leave the Jan rx. Make sure the pharmacy does not fill this in before Dec 31. Adderall rx's are OK and without discrepancy to my prescribing.

## 2022-08-24 NOTE — Telephone Encounter (Signed)
Taken care of by PCP.

## 2022-08-25 LAB — DRUG MONITORING PANEL 376104, URINE
Amphetamine: >15000 ng/mL — ABNORMAL HIGH (ref <250)
Amphetamines: POSITIVE ng/mL — AB (ref <500)
Barbiturates: NEGATIVE ng/mL (ref <300)
Benzodiazepines: NEGATIVE ng/mL (ref <100)
Cocaine Metabolite: NEGATIVE ng/mL (ref <150)
Codeine: NEGATIVE ng/mL (ref <50)
Desmethyltramadol: NEGATIVE ng/mL (ref <100)
Hydrocodone: NEGATIVE ng/mL (ref <50)
Hydromorphone: NEGATIVE ng/mL (ref <50)
Methamphetamine: NEGATIVE ng/mL (ref <250)
Morphine: NEGATIVE ng/mL (ref <50)
Norhydrocodone: NEGATIVE ng/mL (ref <50)
Noroxycodone: >10000 ng/mL — ABNORMAL HIGH (ref <50)
Opiates: NEGATIVE ng/mL (ref <100)
Oxycodone: 7012 ng/mL — ABNORMAL HIGH (ref <50)
Oxycodone: POSITIVE ng/mL — AB (ref <100)
Oxymorphone: 1121 ng/mL — ABNORMAL HIGH (ref <50)
Tramadol: NEGATIVE ng/mL (ref <100)

## 2022-08-25 LAB — DM TEMPLATE

## 2022-09-05 ENCOUNTER — Encounter: Payer: Self-pay | Admitting: Family Medicine

## 2022-09-17 ENCOUNTER — Other Ambulatory Visit: Payer: Self-pay | Admitting: Family Medicine

## 2022-09-17 DIAGNOSIS — R41840 Attention and concentration deficit: Secondary | ICD-10-CM

## 2022-09-17 NOTE — Telephone Encounter (Signed)
Requesting: Adderall 10mg  and Adderall XR 20mg  Contract: None UDS: 08/22/22 Last Visit: 08/22/22 Next Visit: None Last Refill: 08/20/22 #30 and 0RF   Please Advise

## 2022-09-18 ENCOUNTER — Other Ambulatory Visit: Payer: Self-pay | Admitting: Family Medicine

## 2022-09-18 DIAGNOSIS — F341 Dysthymic disorder: Secondary | ICD-10-CM

## 2022-09-18 MED ORDER — AMPHETAMINE-DEXTROAMPHETAMINE 10 MG PO TABS: ORAL_TABLET | ORAL | 0 refills | Status: DC

## 2022-09-18 MED ORDER — AMPHETAMINE-DEXTROAMPHET ER 20 MG PO CP24: 20.0000 mg | ORAL_CAPSULE | Freq: Every day | ORAL | 0 refills | Status: DC

## 2022-10-04 ENCOUNTER — Encounter: Payer: Self-pay | Admitting: Family Medicine

## 2022-10-04 ENCOUNTER — Other Ambulatory Visit: Payer: Self-pay | Admitting: Family Medicine

## 2022-10-04 DIAGNOSIS — M545 Low back pain, unspecified: Secondary | ICD-10-CM

## 2022-10-04 NOTE — Telephone Encounter (Signed)
Requesting: oxycodone 5mg  (she also has 10mg  on med list?) Contract: None UDS: 08/22/22 Last Visit: 08/22/22 Next Visit: None Last Refill:  09/09/22 #180 and 0RF  I see you have it pended to send on 10/09/22 but early refill request- Pt going out of town.   Please Advise

## 2022-10-05 MED ORDER — OXYCODONE HCL 5 MG PO TABS: 5.0000 mg | ORAL_TABLET | Freq: Three times a day (TID) | ORAL | 0 refills | Status: DC | PRN

## 2022-10-05 NOTE — Telephone Encounter (Signed)
Early refill OK'd. Next due for refill should be requested/filled around 11/06/22.

## 2022-10-17 ENCOUNTER — Other Ambulatory Visit: Payer: Self-pay | Admitting: Family Medicine

## 2022-10-17 DIAGNOSIS — F411 Generalized anxiety disorder: Secondary | ICD-10-CM

## 2022-10-20 ENCOUNTER — Ambulatory Visit
Admission: EM | Admit: 2022-10-20 | Discharge: 2022-10-20 | Disposition: A | Discharge status: Home / Self Care | Payer: Medicaid Other | Attending: Family Medicine | Admitting: Family Medicine

## 2022-10-20 ENCOUNTER — Ambulatory Visit (INDEPENDENT_AMBULATORY_CARE_PROVIDER_SITE_OTHER): Payer: Medicaid Other

## 2022-10-20 ENCOUNTER — Encounter: Payer: Self-pay | Admitting: Family Medicine

## 2022-10-20 DIAGNOSIS — J40 Bronchitis, not specified as acute or chronic: Secondary | ICD-10-CM | POA: Insufficient documentation

## 2022-10-20 DIAGNOSIS — Z1152 Encounter for screening for COVID-19: Secondary | ICD-10-CM | POA: Insufficient documentation

## 2022-10-20 DIAGNOSIS — R509 Fever, unspecified: Secondary | ICD-10-CM | POA: Diagnosis not present

## 2022-10-20 DIAGNOSIS — J441 Chronic obstructive pulmonary disease with (acute) exacerbation: Secondary | ICD-10-CM | POA: Diagnosis not present

## 2022-10-20 DIAGNOSIS — R059 Cough, unspecified: Secondary | ICD-10-CM | POA: Diagnosis not present

## 2022-10-20 DIAGNOSIS — J4531 Mild persistent asthma with (acute) exacerbation: Secondary | ICD-10-CM

## 2022-10-20 LAB — RESP PANEL BY RT-PCR (RSV, FLU A&B, COVID)  RVPGX2
Influenza A by PCR: NEGATIVE
Influenza B by PCR: NEGATIVE
Resp Syncytial Virus by PCR: NEGATIVE
SARS Coronavirus 2 by RT PCR: NEGATIVE

## 2022-10-20 MED ORDER — PREDNISONE 20 MG PO TABS: 40.0000 mg | ORAL_TABLET | Freq: Every day | ORAL | 0 refills | Status: AC

## 2022-10-20 MED ORDER — PROMETHAZINE-DM 6.25-15 MG/5ML PO SYRP: 5.0000 mL | ORAL_SOLUTION | Freq: Four times a day (QID) | ORAL | 0 refills | Status: DC | PRN

## 2022-10-20 MED ORDER — ALBUTEROL SULFATE HFA 108 (90 BASE) MCG/ACT IN AERS: 2.0000 | INHALATION_SPRAY | RESPIRATORY_TRACT | 0 refills | Status: AC | PRN

## 2022-10-20 NOTE — Discharge Instructions (Signed)
Your chest x-ray did not show any pneumonia.  You do not have COVID, flu, RSV.  I suspect you have a viral bronchitis that is making your asthma worse.  A steroid course was sent to your pharmacy.  Stop by the pharmacy to pick up your prescriptions.

## 2022-10-20 NOTE — ED Triage Notes (Signed)
Pt c/o cough,bodyaches,fevers,chills & wheezing x1 wk. Hx of asthma, has been using inhaler w/o relief.

## 2022-10-20 NOTE — ED Provider Notes (Signed)
[facility]    CSN: [id number] Arrival date & time: 10/20/22  1435      History   Chief Complaint Chief Complaint  Patient presents with   Cough    Cough and wheezing - Entered by patient    HPI Rachel [name] is a 42 y.o. female.   HPI   Rachel presents for cough and wheezing since ***. Feels like her cough and wheezing has gotten worse.  She has asthma and has been using it more frequently. Has chest tightness and shortness of breath. Endorses body aches. Denies rhinorrhea and nasal congestion.  Fever as high as 101 F. Took Motrin and Tylenol.  Last taken around 1230 PM.  Has been having nausea but no vomiting or diarrhea. No belly pain. No recent sick contacts but have 3 kids in school right now.     Past Medical History:  Diagnosis Date   Allergy    Anxiety    Back pain    Depression    Fever blister 02/12/2016   Migraines     Patient Active Problem List   Diagnosis Date Noted   GAD (generalized anxiety disorder) 11/15/2021   Inattention 08/28/2021   Vitamin D deficiency 11/13/2019   B12 deficiency 11/13/2019   Long term current use of opiate analgesic 07/31/2016   Chronic hip pain (Bilateral) (R>L) 07/31/2016   Chronic shoulder pain (Location of Secondary source of pain) (Left) 07/31/2016   Chronic lower extremity pain (Location of Tertiary source of pain) (Bilateral) (R>L) 07/31/2016   Chronic low back pain (Location of Primary Source of Pain) (Bilateral) (R>L) 12/16/2015   Depression, recurrent ([place]) 03/06/2015    Past Surgical History:  Procedure Laterality Date   CESAREAN SECTION N/A 08/26/2015   Procedure: CESAREAN SECTION;  Surgeon: Rubie Maid, MD;  Location: [facility];  Service: Obstetrics;  Laterality: N/A;   CESAREAN SECTION WITH BILATERAL TUBAL LIGATION  06/06/2018   Procedure: CESAREAN SECTION WITH BILATERAL TUBAL LIGATION;  Surgeon: Ward, Honor Loh, MD;  Location: [facility];  Service: Obstetrics;;   GANGLION CYST EXCISION  Right wrist   GANGLION CYST EXCISION     right hand 07/2014    OB History     Gravida  3   Para  3   Term  2   Preterm  1   AB  0   Living  3      SAB  0   IAB  0   Ectopic  0   Multiple  0   Live Births  3            Home Medications    Prior to Admission medications   Medication Sig Start Date End Date Taking? Authorizing Provider  amphetamine-dextroamphetamine (ADDERALL XR) 20 MG 24 hr capsule Take 1 capsule (20 mg total) by mouth daily. 09/18/22  Yes Shelda Pal, DO  amphetamine-dextroamphetamine (ADDERALL) 10 MG tablet Take 1 tab daily at lunch. 09/18/22  Yes Shelda Pal, DO  cholecalciferol (VITAMIN D3) 25 MCG (1000 UNIT) tablet Take 1,000 Units by mouth daily.   Yes [provider]  cyanocobalamin (,VITAMIN B-12,) 1000 MCG/ML injection 1 ml (102mcg) [place] daily x 7 days then weekly x 4 weeks then once/month 04/08/20  Yes Orma Flaming, MD  ibuprofen (ADVIL) 200 MG tablet Take 200 mg by mouth every 6 (six) hours as needed.   Yes [provider]  mirtazapine (REMERON) 7.5 MG tablet TAKE ONE TABLET BY MOUTH AT BEDTIME 10/17/22  Yes  Shelda Pal, DO  oxyCODONE (OXY IR/ROXICODONE) 5 MG immediate release tablet Take 1-2 tablets (5-10 mg total) by mouth every 8 (eight) hours as needed for severe pain. 10/09/22  Yes Shelda Pal, DO  oxyCODONE (OXY IR/ROXICODONE) 5 MG immediate release tablet Take 1-2 tablets (5-10 mg total) by mouth 3 (three) times daily as needed for severe pain. 10/05/22  Yes Shelda Pal, DO  Oxycodone HCl 10 MG TABS Take 0.5-1 tablets (5-10 mg total) by mouth 3 (three) times daily as needed (Pain). 08/10/22  Yes Shelda Pal, DO  sertraline (ZOLOFT) 100 MG tablet Take 2 tablets (200 mg total) by mouth daily. 09/18/22  Yes Wendling, Crosby Oyster, DO  Syringe/Needle, Disp, (SYRINGE 3CC/25GX1") 25G X 1" 3 ML MISC 1 Device by Does not apply route as directed. 04/08/20  Yes  Orma Flaming, MD  Vitamin D, Ergocalciferol, (DRISDOL) 1.25 MG (50000 UNIT) CAPS capsule One capsule by mouth once a week for 12 weeks. Then take 2000IU/day 11/13/19  Yes Orma Flaming, MD    Family History Family History  Problem Relation Age of Onset   Heart disease Mother    Heart disease Father     Social History Social History   Tobacco Use   Smoking status: Never   Smokeless tobacco: Never  Substance Use Topics   Alcohol use: No   Drug use: No     Allergies   Penicillins and Sulfa antibiotics   Review of Systems Review of Systems: negative unless otherwise stated in HPI.      Physical Exam Triage Vital Signs ED Triage Vitals  Enc Vitals Group     BP 10/20/22 1444 107/76     Pulse Rate 10/20/22 1444 (!) 102     Resp 10/20/22 1444 16     Temp 10/20/22 1444 98.2 F (36.8 C)     Temp Source 10/20/22 1444 Oral     SpO2 10/20/22 1444 95 %     Weight 10/20/22 1443 200 lb (90.7 kg)     Height 10/20/22 1443 5\' 5"  (1.651 m)     Head Circumference --      Peak Flow --      Pain Score 10/20/22 1442 6     Pain Loc --      Pain Edu? --      Excl. in [place]? --    No data found.  Updated Vital Signs BP 107/76 (BP Location: Left Arm)   Pulse (!) 102   Temp 98.2 F (36.8 C) (Oral)   Resp 16   Ht 5\' 5"  (1.651 m)   Wt 90.7 kg   LMP 10/06/2022 (Approximate)   SpO2 95%   BMI 33.28 kg/m   Visual Acuity Right Eye Distance:   Left Eye Distance:   Bilateral Distance:    Right Eye Near:   Left Eye Near:    Bilateral Near:     Physical Exam GEN:     alert, non-toxic appearing female in no distress    HENT:  mucus membranes moist, oropharyngeal without erythema, lesions or exudate, no tonsillar hypertrophy, moderate erythematous edematous turbinates, clear nasal discharge, bilateral TM normal EYES:   pupils equal and reactive, no scleral injection or discharge NECK:  normal ROM, no meningismus   RESP:  no increased work of breathing, coarse breathe sounds with  expiratory wheezing CVS:   regular rhythm, tachycardic Skin:   warm and dry    UC Treatments / Results  Labs (all labs ordered are  listed, but only abnormal results are displayed) Labs Reviewed  RESP PANEL BY RT-PCR (RSV, FLU A&B, COVID)  [id number]    EKG   Radiology No results found.  Procedures Procedures (including critical care time)  Medications Ordered in UC Medications - No data to display  Initial Impression / Assessment and Plan / UC Course  I have reviewed the triage vital signs and the nursing notes.  Pertinent labs & imaging results that were available during my care of the patient were reviewed by me and considered in my medical decision making (see chart for details).       Pt is a 42 y.o. female with asthma who presents for 1 week of respiratory symptoms. [name] is afebrile here though has had recent antipyretics. Satting adequately on room air. Overall pt is ill but non-toxic appearing, well hydrated, without respiratory distress. Pulmonary exam remarkable coarse breathe sounds with expiratory wheezing.  COVID and influenza testing obtained ***and was negative. ***Pt to quarantine until COVID test results or longer if positive.  I will call patient with test results, if positive. History consistent with ***viral respiratory illness. Discussed symptomatic treatment.  Explained lack of efficacy of antibiotics in viral disease.  Typical duration of symptoms discussed.   Return and ED precautions given and voiced understanding. Discussed MDM, treatment plan and plan for follow-up with patient/guardian*** who agrees with plan.     Final Clinical Impressions(s) / UC Diagnoses   Final diagnoses:  None   Discharge Instructions   None    ED Prescriptions   None    PDMP not reviewed this encounter.

## 2022-11-01 ENCOUNTER — Encounter: Payer: Self-pay | Admitting: Family Medicine

## 2022-11-01 ENCOUNTER — Other Ambulatory Visit: Payer: Self-pay | Admitting: Family Medicine

## 2022-11-01 MED ORDER — OXYCODONE HCL 5 MG PO TABS: 5.0000 mg | ORAL_TABLET | Freq: Three times a day (TID) | ORAL | 0 refills | Status: DC | PRN

## 2022-11-01 NOTE — Telephone Encounter (Signed)
Requesting: oxycodone 5mg   Contract: None UDS:08/22/22 Last Visit: 08/22/22 Next Visit: None Last Refill: 10/05/22 #180 and 0RF   Please Advise

## 2022-12-26 ENCOUNTER — Other Ambulatory Visit: Payer: Self-pay | Admitting: Family Medicine

## 2022-12-26 MED ORDER — OXYCODONE HCL 5 MG PO TABS: 5.0000 mg | ORAL_TABLET | Freq: Three times a day (TID) | ORAL | 0 refills | Status: DC | PRN

## 2022-12-26 NOTE — Telephone Encounter (Signed)
Requesting: oxycodone 5mg   Contract: 11/05/2016 UDS: 08/22/22 Last Visit: 08/22/22 Next Visit: None Last Refill:11/01/22 #180 and 0RF (x2)  Please Advise

## 2022-12-28 ENCOUNTER — Encounter: Payer: Self-pay | Admitting: Family Medicine

## 2022-12-28 ENCOUNTER — Other Ambulatory Visit: Payer: Self-pay | Admitting: Family Medicine

## 2022-12-28 MED ORDER — OXYCODONE HCL 5 MG PO TABS: 5.0000 mg | ORAL_TABLET | Freq: Three times a day (TID) | ORAL | 0 refills | Status: DC | PRN

## 2023-01-31 ENCOUNTER — Other Ambulatory Visit: Payer: Self-pay | Admitting: Family Medicine

## 2023-01-31 DIAGNOSIS — F341 Dysthymic disorder: Secondary | ICD-10-CM

## 2023-02-05 ENCOUNTER — Telehealth: Payer: Medicaid Other | Admitting: Physician Assistant

## 2023-02-05 DIAGNOSIS — B029 Zoster without complications: Secondary | ICD-10-CM | POA: Diagnosis not present

## 2023-02-05 MED ORDER — VALACYCLOVIR HCL 1 G PO TABS: 1000.0000 mg | ORAL_TABLET | Freq: Three times a day (TID) | ORAL | 0 refills | Status: AC

## 2023-02-05 NOTE — Progress Notes (Signed)
E-visit for Shingles   We are sorry that you are not feeling well. Here is how we plan to help!  Based on what you shared with me it looks like you have shingles.  Shingles or herpes zoster, is a common infection of the nerves.  It is a painful rash caused by the herpes zoster virus.  This is the same virus that causes chickenpox.  After a person has chickenpox, the virus remains inactive in the nerve cells.  Years later, the virus can become active again and travel to the skin.  It typically will appear on one side of the face or body.  Burning or shooting pain, tingling, or itching are early signs of the infection.  Blisters typically scab over in 7 to 10 days and clear up within 2-4 weeks. Shingles is only contagious to people that have never had the chickenpox, the chickenpox vaccine, or anyone who has a compromised immune system.  You should avoid contact with these type of people until your blisters scab over.  I have prescribed Valacyclovir 1g three times daily for 7 days    HOME CARE: Apply ice packs (wrapped in a thin towel), cool compresses, or soak in cool bath to help reduce pain. Use calamine lotion to calm itchy skin. Avoid scratching the rash. Avoid direct sunlight.  GET HELP RIGHT AWAY IF: Symptoms that don't away after treatment. A rash or blisters near your eye. Increased drainage, fever, or rash after treatment. Severe pain that doesn't go away.   MAKE SURE YOU   Understand these instructions. Will watch your condition. Will get help right away if you are not doing well or get worse.  Thank you for choosing an e-visit.  Your e-visit answers were reviewed by a board certified advanced clinical practitioner to complete your personal care plan. Depending upon the condition, your plan could have included both over the counter or prescription medications.  Please review your pharmacy choice. Make sure the pharmacy is open so you can pick up prescription now. If there  is a problem, you may contact your provider through MyChart messaging and have the prescription routed to another pharmacy.  Your safety is important to us. If you have drug allergies check your prescription carefully.   For the next 24 hours you can use MyChart to ask questions about today's visit, request a non-urgent call back, or ask for a work or school excuse. You will get an email in the next two days asking about your experience. I hope that your e-visit has been valuable and will speed your recovery.   I have spent 5 minutes in review of e-visit questionnaire, review and updating patient chart, medical decision making and response to patient.   Eliani Leclere M Tryston Gilliam, PA-C  

## 2023-02-18 ENCOUNTER — Other Ambulatory Visit: Payer: Self-pay | Admitting: Family Medicine

## 2023-02-18 MED ORDER — OXYCODONE HCL 5 MG PO TABS: 5.0000 mg | ORAL_TABLET | Freq: Three times a day (TID) | ORAL | 0 refills | Status: DC | PRN

## 2023-02-18 NOTE — Telephone Encounter (Signed)
She needs appt, it has been 6 mo.

## 2023-02-27 ENCOUNTER — Other Ambulatory Visit: Payer: Self-pay | Admitting: Family Medicine

## 2023-03-01 ENCOUNTER — Encounter: Payer: Self-pay | Admitting: Family Medicine

## 2023-03-01 ENCOUNTER — Other Ambulatory Visit: Payer: Self-pay | Admitting: Family Medicine

## 2023-03-05 MED ORDER — OXYCODONE HCL 5 MG PO TABS: 5.0000 mg | ORAL_TABLET | Freq: Three times a day (TID) | ORAL | 0 refills | Status: DC | PRN

## 2023-03-08 ENCOUNTER — Telehealth (INDEPENDENT_AMBULATORY_CARE_PROVIDER_SITE_OTHER): Payer: Medicaid Other | Admitting: Family Medicine

## 2023-03-08 ENCOUNTER — Encounter: Payer: Self-pay | Admitting: Family Medicine

## 2023-03-08 DIAGNOSIS — F411 Generalized anxiety disorder: Secondary | ICD-10-CM | POA: Diagnosis not present

## 2023-03-08 DIAGNOSIS — M545 Low back pain, unspecified: Secondary | ICD-10-CM | POA: Diagnosis not present

## 2023-03-08 DIAGNOSIS — G8929 Other chronic pain: Secondary | ICD-10-CM

## 2023-03-08 MED ORDER — DULOXETINE HCL 60 MG PO CPEP: 60.0000 mg | ORAL_CAPSULE | Freq: Every day | ORAL | 3 refills | Status: DC

## 2023-03-08 MED ORDER — OXYCODONE HCL 10 MG PO TABS: 5.0000 mg | ORAL_TABLET | Freq: Three times a day (TID) | ORAL | 0 refills | Status: DC | PRN

## 2023-03-08 NOTE — Progress Notes (Signed)
Chief Complaint  Patient presents with   Medication Refill   Anxiety    Subjective: Patient is a 42 y.o. female here for f/u. We are interacting via web portal for an electronic face-to-face visit. I verified patient's ID using 2 identifiers. Patient agreed to proceed with visit via this method. Patient is at home, I am at office. Patient and I are present for visit.   Patient has a history of chronic low back pain on chronic opiates.  She started a job recently and will have insurance in early July.  She would like to see his pain specialist and hopefully wean off of her oxycodone.  She is currently taking 7.5 mg 3 times daily.  Because she started a new job, she is standing more frequently and has noticed more pain.  No other obvious trigger, injury, or change in activity.  No new neurologic signs or symptoms.  No bowel/bladder incontinence.  She reports compliance with medication and no adverse effects.  Patient has a history of generalized anxiety.  She is currently taking Zoloft 200 mg daily in addition to Remeron 7.5 mg nightly as needed.  She reports compliance and no adverse effects.  No homicidal or suicidal ideation.  No self-medication.  She is not following with a therapist.    Past Medical History:  Diagnosis Date   Allergy    Anxiety    Back pain    Depression    Fever blister 02/12/2016   Migraines     Objective: No conversational dyspnea Age appropriate judgment and insight Nml affect and mood  Assessment and Plan: Chronic low back pain without sciatica, unspecified back pain laterality  GAD (generalized anxiety disorder)  Chronic, stable for now.  Continue oxycodone 5 to 10 mg 3 times daily as needed.  She will send me a message when her insurance kicks in and we will place referral to the pain management team. Chronic, somewhat unstable.  We will change Zoloft to Cymbalta 60 mg daily.  Hopefully this helps with her pain as well.  Continue Remeron 7.5 mg nightly as  needed.  Follow-up in 6 weeks to recheck this change. The patient voiced understanding and agreement to the plan.  Jilda Roche Chapin, DO 03/08/23  4:11 PM

## 2023-03-22 ENCOUNTER — Other Ambulatory Visit: Payer: Self-pay | Admitting: Family Medicine

## 2023-03-22 DIAGNOSIS — F411 Generalized anxiety disorder: Secondary | ICD-10-CM

## 2023-03-22 DIAGNOSIS — G8929 Other chronic pain: Secondary | ICD-10-CM

## 2023-04-25 ENCOUNTER — Encounter: Payer: Self-pay | Admitting: Family Medicine

## 2023-04-26 ENCOUNTER — Other Ambulatory Visit: Payer: Self-pay | Admitting: Family Medicine

## 2023-04-26 ENCOUNTER — Encounter: Payer: Self-pay | Admitting: Family Medicine

## 2023-04-26 DIAGNOSIS — G8929 Other chronic pain: Secondary | ICD-10-CM

## 2023-04-26 MED ORDER — OXYCODONE HCL 10 MG PO TABS: 5.0000 mg | ORAL_TABLET | Freq: Three times a day (TID) | ORAL | 0 refills | Status: DC | PRN

## 2023-04-30 ENCOUNTER — Other Ambulatory Visit: Payer: Self-pay | Admitting: Family Medicine

## 2023-04-30 DIAGNOSIS — F341 Dysthymic disorder: Secondary | ICD-10-CM

## 2023-05-01 ENCOUNTER — Other Ambulatory Visit: Payer: Self-pay | Admitting: Family Medicine

## 2023-05-01 ENCOUNTER — Encounter: Payer: Self-pay | Admitting: Family Medicine

## 2023-05-01 DIAGNOSIS — F341 Dysthymic disorder: Secondary | ICD-10-CM

## 2023-05-02 ENCOUNTER — Other Ambulatory Visit: Payer: Self-pay | Admitting: Family Medicine

## 2023-05-02 DIAGNOSIS — F341 Dysthymic disorder: Secondary | ICD-10-CM

## 2023-05-02 MED ORDER — SERTRALINE HCL 100 MG PO TABS: 100.0000 mg | ORAL_TABLET | Freq: Two times a day (BID) | ORAL | 3 refills | Status: DC

## 2023-05-02 NOTE — Telephone Encounter (Signed)
Strength of zoloft?

## 2023-05-17 ENCOUNTER — Other Ambulatory Visit: Payer: Self-pay

## 2023-05-17 ENCOUNTER — Emergency Department: Payer: Medicaid Other

## 2023-05-17 ENCOUNTER — Emergency Department
Admission: EM | Admit: 2023-05-17 | Discharge: 2023-05-17 | Disposition: A | Discharge status: Home / Self Care | Payer: Medicaid Other | Attending: Emergency Medicine | Admitting: Emergency Medicine

## 2023-05-17 DIAGNOSIS — N390 Urinary tract infection, site not specified: Secondary | ICD-10-CM | POA: Diagnosis not present

## 2023-05-17 DIAGNOSIS — R109 Unspecified abdominal pain: Secondary | ICD-10-CM | POA: Diagnosis not present

## 2023-05-17 DIAGNOSIS — R1031 Right lower quadrant pain: Secondary | ICD-10-CM | POA: Diagnosis present

## 2023-05-17 LAB — URINALYSIS, ROUTINE W REFLEX MICROSCOPIC
Bilirubin Urine: NEGATIVE
Glucose, UA: 50 mg/dL — AB
Ketones, ur: NEGATIVE mg/dL
Nitrite: NEGATIVE
Protein, ur: 100 mg/dL — AB
RBC / HPF: >50 RBC/hpf (ref 0–5)
Specific Gravity, Urine: 1.015 (ref 1.005–1.030)
WBC, UA: >50 WBC/hpf (ref 0–5)
pH: 5 (ref 5.0–8.0)

## 2023-05-17 LAB — BASIC METABOLIC PANEL
Anion gap: 8 (ref 5–15)
BUN: 11 mg/dL (ref 6–20)
CO2: 24 mmol/L (ref 22–32)
Calcium: 9.1 mg/dL (ref 8.9–10.3)
Chloride: 106 mmol/L (ref 98–111)
Creatinine, Ser: 0.66 mg/dL (ref 0.44–1.00)
GFR, Estimated: >60 mL/min (ref >60)
Glucose, Bld: 116 mg/dL — ABNORMAL HIGH (ref 70–99)
Potassium: 3.8 mmol/L (ref 3.5–5.1)
Sodium: 138 mmol/L (ref 135–145)

## 2023-05-17 LAB — CBC
HCT: 38.8 % (ref 36.0–46.0)
Hemoglobin: 12.2 g/dL (ref 12.0–15.0)
MCH: 26.9 pg (ref 26.0–34.0)
MCHC: 31.4 g/dL (ref 30.0–36.0)
MCV: 85.7 fL (ref 80.0–100.0)
Platelets: 223 10*3/uL (ref 150–400)
RBC: 4.53 MIL/uL (ref 3.87–5.11)
RDW: 13.8 % (ref 11.5–15.5)
WBC: 7 10*3/uL (ref 4.0–10.5)
nRBC: 0 % (ref 0.0–0.2)

## 2023-05-17 LAB — PREGNANCY, URINE: Preg Test, Ur: NEGATIVE

## 2023-05-17 MED ORDER — ONDANSETRON HCL 4 MG/2ML IJ SOLN
4.0000 mg | Freq: Once | INTRAMUSCULAR | Status: AC
  Administered 2023-05-17: 4 mg via INTRAVENOUS
  Filled 2023-05-17: qty 2

## 2023-05-17 MED ORDER — KETOROLAC TROMETHAMINE 15 MG/ML IJ SOLN
15.0000 mg | Freq: Once | INTRAMUSCULAR | Status: AC
  Administered 2023-05-17: 15 mg via INTRAVENOUS
  Filled 2023-05-17: qty 1

## 2023-05-17 MED ORDER — CEFDINIR 300 MG PO CAPS: 300.0000 mg | ORAL_CAPSULE | Freq: Two times a day (BID) | ORAL | 0 refills | Status: DC

## 2023-05-17 MED ORDER — NAPROXEN 500 MG PO TABS: 500.0000 mg | ORAL_TABLET | Freq: Two times a day (BID) | ORAL | 0 refills | Status: AC

## 2023-05-17 NOTE — Discharge Instructions (Addendum)
Your urinalysis is suggestive of a urinary tract infection.  Your CT scan does not show any involvement of your kidney or any kidney stones.  Please follow-up with your outpatient provider.  Please return for any new, worsening, or change in symptoms or other concerns.  It was a pleasure caring for you today.

## 2023-05-17 NOTE — ED Triage Notes (Signed)
Pt to ED for right sided flank pain started yesterday. +dysuria. +nausea

## 2023-05-17 NOTE — ED Notes (Signed)
See triage note  Presents with pain to right flank area which is moving into right abd  Pain started on weds  Had some nausea  Positive dysuria

## 2023-05-17 NOTE — ED Provider Notes (Signed)
[facility] Provider Note    Event Date/Time   First MD Initiated Contact with Patient 05/17/23 1142     (approximate)   History   Flank Pain   HPI  Rachel [name] is a 42 y.o. female with a past medical history of generalized anxiety disorder chronic pain who presents today for evaluation of burning with urination for the past 4 days with urgency and frequency.  She reports that she started to have mild right sided flank pain as well.  No fevers or chills.  She is nauseous but has not had any vomiting.  No vaginal discharge or bleeding.  Patient Active Problem List   Diagnosis Date Noted   GAD (generalized anxiety disorder) 11/15/2021   Inattention 08/28/2021   Vitamin D deficiency 11/13/2019   B12 deficiency 11/13/2019   Long term current use of opiate analgesic 07/31/2016   Chronic hip pain (Bilateral) (R>L) 07/31/2016   Chronic shoulder pain (Location of Secondary source of pain) (Left) 07/31/2016   Chronic lower extremity pain (Location of Tertiary source of pain) (Bilateral) (R>L) 07/31/2016   Chronic low back pain without sciatica 12/16/2015   Depression, recurrent ([facility]) 03/06/2015          Physical Exam   Triage Vital Signs: ED Triage Vitals  Encounter Vitals Group     BP 05/17/23 1111 126/80     Systolic BP Percentile --      Diastolic BP Percentile --      Pulse Rate 05/17/23 1111 100     Resp 05/17/23 1111 16     Temp 05/17/23 1111 98.2 F (36.8 C)     Temp src --      SpO2 05/17/23 1111 100 %     Weight 05/17/23 1112 190 lb (86.2 kg)     Height 05/17/23 1112 5' 4.5" (1.638 m)     Head Circumference --      Peak Flow --      Pain Score 05/17/23 1111 8     Pain Loc --      Pain Education --      Exclude from Growth Chart --     Most recent vital signs: Vitals:   05/17/23 1111  BP: 126/80  Pulse: 100  Resp: 16  Temp: 98.2 F (36.8 C)  SpO2: 100%    Physical Exam Vitals and nursing note reviewed.   Constitutional:      General: Awake and alert. No acute distress.    Appearance: Normal appearance. The patient is obese.  HENT:     Head: Normocephalic and atraumatic.     Mouth: Mucous membranes are moist.  Eyes:     General: PERRL. Normal EOMs        Right eye: No discharge.        Left eye: No discharge.     Conjunctiva/sclera: Conjunctivae normal.  Cardiovascular:     Rate and Rhythm: Normal rate and regular rhythm.     Pulses: Normal pulses.  Pulmonary:     Effort: Pulmonary effort is normal. No respiratory distress.     Breath sounds: Normal breath sounds.  Abdominal:     Abdomen is soft. There is suprapubic abdominal tenderness. No rebound or guarding. No distention.  No CVA tenderness Musculoskeletal:        General: No swelling. Normal range of motion.     Cervical back: Normal range of motion and neck supple.  Skin:    General: Skin is warm and  dry.     Capillary Refill: Capillary refill takes less than 2 seconds.     Findings: No rash.  Neurological:     Mental Status: The patient is awake and alert.      ED Results / Procedures / Treatments   Labs (all labs ordered are listed, but only abnormal results are displayed) Labs Reviewed  BASIC METABOLIC PANEL - Abnormal; Notable for the following components:      Result Value   Glucose, Bld 116 (*)    All other components within normal limits  URINALYSIS, ROUTINE W REFLEX MICROSCOPIC - Abnormal; Notable for the following components:   Color, Urine YELLOW (*)    APPearance HAZY (*)    Glucose, UA 50 (*)    Hgb urine dipstick MODERATE (*)    Protein, ur 100 (*)    Leukocytes,Ua LARGE (*)    Bacteria, UA RARE (*)    All other components within normal limits  URINE CULTURE  CBC  PREGNANCY, URINE     EKG     RADIOLOGY I independently reviewed and interpreted imaging and agree with radiologists findings.     PROCEDURES:  Critical Care performed:   Procedures   MEDICATIONS ORDERED IN  ED: Medications  ondansetron (ZOFRAN) injection 4 mg (4 mg Intravenous Given 05/17/23 1221)  ketorolac (TORADOL) 15 MG/ML injection 15 mg (15 mg Intravenous Given 05/17/23 1221)     IMPRESSION / MDM / ASSESSMENT AND PLAN / ED COURSE  I reviewed the triage vital signs and the nursing notes.   Differential diagnosis includes, but is not limited to, UTI, pyelonephritis, nephrolithiasis.  Patient is awake and alert, hemodynamically stable and afebrile.  She is nontoxic in appearance.  I reviewed the patient's chart.  She is followed by family medicine for chronic low back pain and dysthymia.  Further workup is indicated.  Labs were obtained in triage and are overall reassuring.  Her urinalysis is suggestive of urinary tract infection with greater than 50 WBCs and greater than 50 RBCs.  CT renal stone was obtained for evaluation of concurrent stone, however this was negative for any acute findings.  There is no evidence of pyelonephritis on CT scan either.  She was treated symptomatically with Zofran and Toradol with significant improvement of her symptoms.  I called the lab and asked them to send her urine for culture.  Discussed the options of antibiotics.  Patient reports that she has tolerated cephalosporins in the past and feels comfortable taking cefdinir.  She was also given the option of Cipro, but prefers to take the third-generation cephalosporin.  We discussed strict return precautions and the importance of close outpatient follow-up.  Patient understands and agrees with plan.  She also requested a prescription for pain medication was given a prescription for naproxen given that she had such good relief with the Toradol.  She was discharged in stable condition.  Patient's presentation is most consistent with acute complicated illness / injury requiring diagnostic workup.    FINAL CLINICAL IMPRESSION(S) / ED DIAGNOSES   Final diagnoses:  Lower urinary tract infectious disease     Rx  / DC Orders   ED Discharge Orders          Ordered    cefdinir (OMNICEF) 300 MG capsule  2 times daily        05/17/23 1423    naproxen (NAPROSYN) 500 MG tablet  2 times daily with meals        05/17/23 1429  Note:  This document was prepared using Dragon voice recognition software and may include unintentional dictation errors.   Jackelyn Hoehn, PA-C 05/17/23 1445    Corena Herter, MD 05/17/23 [phone]

## 2023-05-20 ENCOUNTER — Encounter: Payer: Self-pay | Admitting: Family Medicine

## 2023-05-22 ENCOUNTER — Encounter: Payer: Self-pay | Admitting: Family Medicine

## 2023-05-22 ENCOUNTER — Ambulatory Visit (INDEPENDENT_AMBULATORY_CARE_PROVIDER_SITE_OTHER): Payer: Medicaid Other | Admitting: Family Medicine

## 2023-05-22 ENCOUNTER — Telehealth: Payer: Self-pay | Admitting: Family Medicine

## 2023-05-22 VITALS — BP 110/64 | HR 89 | Temp 98.2°F | Ht 65.0 in | Wt 185.4 lb

## 2023-05-22 DIAGNOSIS — M545 Low back pain, unspecified: Secondary | ICD-10-CM

## 2023-05-22 DIAGNOSIS — N3000 Acute cystitis without hematuria: Secondary | ICD-10-CM | POA: Diagnosis not present

## 2023-05-22 DIAGNOSIS — G8929 Other chronic pain: Secondary | ICD-10-CM

## 2023-05-22 MED ORDER — OXYCODONE HCL 10 MG PO TABS: 5.0000 mg | ORAL_TABLET | Freq: Three times a day (TID) | ORAL | 0 refills | Status: DC | PRN

## 2023-05-22 MED ORDER — FLUCONAZOLE 150 MG PO TABS: ORAL_TABLET | ORAL | 0 refills | Status: DC

## 2023-05-22 MED ORDER — LEVOFLOXACIN 750 MG PO TABS: 750.0000 mg | ORAL_TABLET | Freq: Every day | ORAL | 0 refills | Status: AC

## 2023-05-22 NOTE — Telephone Encounter (Signed)
Called left message to call back 

## 2023-05-22 NOTE — Telephone Encounter (Signed)
Pt called & requested to speak with Robin regarding recent prescription from today's visit. Please call & advise pt.

## 2023-05-22 NOTE — Progress Notes (Signed)
Chief Complaint  Patient presents with   Hospitalization Follow-up    Subjective: Patient is a 42 y.o. female here for ER f/u.  Patient was seen in the ER on 05/17/2023 and treated for urinary tract infection.  She was placed on Omnicef.  Culture shows pansensitive E. coli.  She reports compliance with the medication and no adverse effects.  Since that time, she reports no improvement (no worsening either). Back pain seems to be worse as well. She has burning w urination, chills, lower abd pain/pressure, hesitancy, retention.   Past Medical History:  Diagnosis Date   Allergy    Anxiety    Back pain    Depression    Fever blister 02/12/2016   Migraines     Objective: BP 110/64 (BP Location: Left Arm, Patient Position: Sitting, Cuff Size: Normal)   Pulse 89   Temp 98.2 F (36.8 C) (Oral)   Ht 5\' 5"  (1.651 m)   Wt 185 lb 6 oz (84.1 kg)   LMP 05/03/2023   SpO2 99%   BMI 30.85 kg/m  General: Awake, appears stated age Heart: RRR, no LE edema Lungs: CTAB, no rales, wheezes or rhonchi. No accessory muscle use MSK: +L CVA ttp Abd: BS+, S, ND, TTP in suprapubic region Psych: Age appropriate judgment and insight, normal affect and mood  Assessment and Plan: Acute cystitis without hematuria - Plan: fluconazole (DIFLUCAN) 150 MG tablet, levofloxacin (LEVAQUIN) 750 MG tablet  Chronic low back pain without sciatica, unspecified back pain laterality - Plan: Oxycodone HCl 10 MG TABS, Oxycodone HCl 10 MG TABS, Oxycodone HCl 10 MG TABS, Ambulatory referral to Pain Clinic  Levaquin 750 mg/d for 7 d, Diflucan prn. Question possible kidney involvement. Refer urology if no improvement. Stay hydrated. Refill oxy today. She now has ins so will refer to pain management.  The patient voiced understanding and agreement to the plan.  Jilda Roche [place], DO 05/22/23  11:48 AM

## 2023-05-22 NOTE — Telephone Encounter (Signed)
PCP did ok (see my chart message) ok to refill oxycodone 2 days early Patient aware [facility]. [facility] [place] [phone] was unable to get through to speak to a pharmacist Had to leave a detailed message.

## 2023-05-22 NOTE — Patient Instructions (Signed)
Stay hydrated.   Warning signs/symptoms: Uncontrollable nausea/vomiting, fevers, worsening symptoms despite treatment, confusion.  If you do not hear anything about your referral in the next 1-2 weeks, call our office and ask for an update.  Let us know if you need anything.

## 2023-05-22 NOTE — Telephone Encounter (Signed)
[facility] and [facility] are filling the medication per patient.

## 2023-05-24 ENCOUNTER — Encounter: Payer: Self-pay | Admitting: Family Medicine

## 2023-05-25 ENCOUNTER — Other Ambulatory Visit: Payer: Self-pay | Admitting: Family Medicine

## 2023-06-11 ENCOUNTER — Encounter: Payer: Self-pay | Admitting: Family Medicine

## 2023-06-28 ENCOUNTER — Telehealth: Payer: Medicaid Other | Admitting: Physician Assistant

## 2023-06-28 DIAGNOSIS — A6004 Herpesviral vulvovaginitis: Secondary | ICD-10-CM

## 2023-06-28 MED ORDER — VALACYCLOVIR HCL 500 MG PO TABS
500.0000 mg | ORAL_TABLET | Freq: Two times a day (BID) | ORAL | 0 refills | Status: DC
Start: 2023-06-28 — End: 2024-02-18

## 2023-06-28 NOTE — Progress Notes (Signed)
E-Visit for Herpes Simplex  We are sorry that you are not feeling well.  Here is how we plan to help!  Based on what you have shared ith me, it looks like you may be having an outbreak/flare-up of genital herpes.    I have prescribed I have prescribed Valacyclovir 500 mg Take one by mouth twice a day for 3 days.    If you have been prescribed long term medications to be taken on a regular basis, it is important to follow the recommendations and take them as ordered.    Outbreaks usually include blisters and open sores in the genital area. Outbreaks that happen after the first time are usually not as severe and do not last as long. Genital Herpes Simplex is a commonly sexually transmitted viral infection that is found worldwide. Most of these genital infections are caused by one or two herpes simplex viruses that is passed from person to person during vaginal, oral, or anal sex. Sometimes, people do not know they have herpes because they do not have any symptoms.  Please be aware that if you have genital herpes you can be contagious even when you are not having rash or flare-up and you may not have any symptoms, even when you are taking suppressive medicines.  Herpes cannot be cured. The disease usually causes most problems during the first few years. After that, the virus is still there, but it causes few to no symptoms. Even when the virus is active, people with herpes can take medicines to reduce and help prevent symptoms.  Herpes is an infection that can cause blisters and open sores on the genital area. Herpes is caused by a virus that is passed from person to person during vaginal, oral, or anal sex. Sometimes, people do not know they have herpes because they do not have any symptoms. Herpes cannot be cured. The disease usually causes most problems during the first few years. After that, the virus is still there, but it causes few to no symptoms. Even when the virus is active, people with herpes  can take medicines to reduce and help prevent symptoms.  If you have been prescribed medications to be taken on a regular basis, it is important to follow the recommendations and take them as ordered.  Some people with herpes never have any symptoms. But other people can develop symptoms within a few weeks of being infected with the herpes virus   Symptoms usually include blisters in the genital area. In women, this area includes the vagina, buttocks, anus, or thighs. In men, this area includes the penis, scrotum, anus, butt, or thighs. The blisters can become painful open sores, which then crust over as they heal. Sometimes, people can have other symptoms that include:  ?Blisters on the mouth or lips ?Fever, headache, or pain in the joints ?Trouble urinating  Outbreaks might occur every month or more often, or just once or twice a year. Sometimes, people can tell when an outbreak will occur, because they feel itching or pain beforehand. Sometimes they do not know that an outbreak is coming because they have no symptoms. Whatever your pattern is, keep in mind that herpes outbreaks usually become less frequent over time as you get older. Certain things, called "triggers," can make outbreaks more likely to occur. These include stress, sunlight, menstrual periods,or getting sick.  Antiviral therapy can shorten the duration of symptoms and signs in primary infection, which, when untreated, can be associated with significant increase in the  symptoms of the disease.  HOME CARE Use a portable bath (such as a "Sitz bath") where you can sit in warm water for about 20 minutes. Your bathtub could also work. Avoid bubble baths.  Keep the genital area clean and dry and avoid tight clothes.  Take over-the-counter pain medicine such as acetaminophen (brand name: Tylenol) or ibuprofen sample brand names: Advil, Motrin). But avoid aspirin.  Only take medications as instructed by your medical team.  You are  most likely to spread herpes to a sex partner when you have blisters and open sores on your body. But it's also possible to spread herpes to your partner when you do not have any symptoms. That is because herpes can be present on your body without causing any symptoms, like blisters or pain.  Telling your sex partner that you have herpes can be hard. But it can help protect them, since there are ways to lower the risk of spreading the infection.   Using a condom every time you have sex  Not having sex when you have symptoms  Not having oral sex if you have blisters or open sores (in the genital area or around your mouth)  MAKE SURE YOU   Understand these instructions. Do not have sex without using a condom until you have been seen by a doctor and as instructed by the provider If you are not better or improved within 7 days, you MUST have a follow up at your doctor or the health department for evaluation. There are other causes of rashes in the genital region.  Thank you for choosing an e-visit.  Your e-visit answers were reviewed by a board certified advanced clinical practitioner to complete your personal care plan. Depending upon the condition, your plan could have included both over the counter or prescription medications.  Please review your pharmacy choice. Make sure the pharmacy is open so you can pick up prescription now. If there is a problem, you may contact your provider through [employer] and have the prescription routed to another pharmacy.  Your safety is important to [place]. If you have drug allergies check your prescription carefully.   For the next 24 hours you can use MyChart to ask questions about today's visit, request a non-urgent call back, or ask for a work or school excuse. You will get an email in the next two days asking about your experience. I hope that your e-visit has been valuable and will speed your recovery.   I have spent 5 minutes in review of e-visit  questionnaire, review and updating patient chart, medical decision making and response to patient.   Margaretann Loveless, PA-C

## 2023-07-15 ENCOUNTER — Encounter: Payer: Self-pay | Admitting: Family Medicine

## 2023-07-15 ENCOUNTER — Telehealth: Payer: Self-pay | Admitting: Family Medicine

## 2023-07-15 NOTE — Telephone Encounter (Signed)
She said she filled it last month on the 10th.  She said when she had the kidney infection she took an extra 1/2 throughout the whole kidney infection. She will be out tomorrow

## 2023-07-15 NOTE — Telephone Encounter (Signed)
No, she does not need a PA, She just needs ok to refill today, but prescription says to refill the 9th.

## 2023-07-15 NOTE — Telephone Encounter (Signed)
She should have 6 days left so should be good. Ty.

## 2023-07-15 NOTE — Telephone Encounter (Signed)
Called the pharmacy and did ok to refill today. Patient is aware The patient stated she is working a time with her employer so she can get off to go to Pain management

## 2023-07-15 NOTE — Telephone Encounter (Signed)
Authorization? A prior auth?

## 2023-07-15 NOTE — Telephone Encounter (Signed)
Pt called and stated that her pharmacy needs authorization for her prescription for Oxycodone HCl 10 MG TABS before she can receive her medication. She requested to speak with the nurse as well regarding this matter. Please call pt's pharmacy.   Pt's telephone number is [phone].

## 2023-08-13 ENCOUNTER — Other Ambulatory Visit: Payer: Self-pay | Admitting: Family Medicine

## 2023-08-13 ENCOUNTER — Encounter: Payer: Self-pay | Admitting: Family Medicine

## 2023-08-13 DIAGNOSIS — G8929 Other chronic pain: Secondary | ICD-10-CM

## 2023-08-13 MED ORDER — OXYCODONE HCL 10 MG PO TABS: 5.0000 mg | ORAL_TABLET | Freq: Three times a day (TID) | ORAL | 0 refills | Status: DC | PRN

## 2023-08-13 NOTE — Telephone Encounter (Signed)
Requesting: oxycodone 10mg   Contract: 08/22/22 UDS: 08/22/22 Last Visit: 05/22/23 Next Visit: None Last Refill:  07/21/23 #90 and 0RF   Please Advise

## 2023-09-10 ENCOUNTER — Other Ambulatory Visit: Payer: Self-pay | Admitting: Family Medicine

## 2023-09-10 DIAGNOSIS — G8929 Other chronic pain: Secondary | ICD-10-CM

## 2023-09-10 MED ORDER — OXYCODONE HCL 10 MG PO TABS
5.0000 mg | ORAL_TABLET | Freq: Three times a day (TID) | ORAL | 0 refills | Status: DC | PRN
Start: 2023-09-10 — End: 2023-10-07

## 2023-09-10 NOTE — Telephone Encounter (Signed)
Requesting: oxycodone 10mg   Contract: 08/22/22 UDS: 08/22/22 Last Visit:  05/22/23 Next Visit: None Last Refill: 08/13/23 #90 and 0RF   Please Advise

## 2023-09-25 ENCOUNTER — Encounter: Payer: Self-pay | Admitting: Physical Medicine and Rehabilitation

## 2023-09-30 DIAGNOSIS — N760 Acute vaginitis: Secondary | ICD-10-CM | POA: Diagnosis not present

## 2023-09-30 DIAGNOSIS — N939 Abnormal uterine and vaginal bleeding, unspecified: Secondary | ICD-10-CM | POA: Diagnosis not present

## 2023-09-30 DIAGNOSIS — R350 Frequency of micturition: Secondary | ICD-10-CM | POA: Diagnosis not present

## 2023-10-07 ENCOUNTER — Telehealth (INDEPENDENT_AMBULATORY_CARE_PROVIDER_SITE_OTHER): Payer: Medicaid Other | Admitting: Family Medicine

## 2023-10-07 ENCOUNTER — Encounter: Payer: Self-pay | Admitting: Family Medicine

## 2023-10-07 DIAGNOSIS — G8929 Other chronic pain: Secondary | ICD-10-CM

## 2023-10-07 DIAGNOSIS — M545 Low back pain, unspecified: Secondary | ICD-10-CM | POA: Diagnosis not present

## 2023-10-07 MED ORDER — OXYCODONE HCL 10 MG PO TABS: 5.0000 mg | ORAL_TABLET | Freq: Four times a day (QID) | ORAL | 0 refills | Status: DC

## 2023-10-07 MED ORDER — OXYCODONE HCL 10 MG PO TABS
5.0000 mg | ORAL_TABLET | Freq: Four times a day (QID) | ORAL | 0 refills | Status: DC
Start: 2023-10-07 — End: 2024-03-13

## 2023-10-07 MED ORDER — AMITRIPTYLINE HCL 25 MG PO TABS
25.0000 mg | ORAL_TABLET | Freq: Every day | ORAL | 1 refills | Status: DC
Start: 2023-10-07 — End: 2024-07-01

## 2023-10-07 MED ORDER — OXYCODONE HCL 10 MG PO TABS
5.0000 mg | ORAL_TABLET | Freq: Four times a day (QID) | ORAL | 0 refills | Status: DC
Start: 2023-12-06 — End: 2023-12-19
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
## 2023-10-08 NOTE — Telephone Encounter (Signed)
 [facility] for verbal order for permission to fill the oxycodone early per provider.

## 2023-11-18 ENCOUNTER — Encounter
Payer: Medicaid Other | Attending: Physical Medicine and Rehabilitation | Admitting: Physical Medicine and Rehabilitation

## 2023-11-28 ENCOUNTER — Telehealth: Payer: Medicaid Other | Admitting: Physician Assistant

## 2023-11-28 DIAGNOSIS — J101 Influenza due to other identified influenza virus with other respiratory manifestations: Secondary | ICD-10-CM | POA: Diagnosis not present

## 2023-11-28 MED ORDER — BENZONATATE 100 MG PO CAPS: 100.0000 mg | ORAL_CAPSULE | Freq: Three times a day (TID) | ORAL | 0 refills | Status: DC | PRN

## 2023-11-28 MED ORDER — OSELTAMIVIR PHOSPHATE 75 MG PO CAPS
75.0000 mg | ORAL_CAPSULE | Freq: Two times a day (BID) | ORAL | 0 refills | Status: DC
Start: 2023-11-28 — End: 2024-02-14

## 2023-11-28 NOTE — Progress Notes (Signed)
 E visit for Flu like symptoms   We are sorry that you are not feeling well.  Here is how we plan to help! Based on what you have shared with me it looks like you may have a respiratory virus that may be influenza.  Influenza or "the flu" is   an infection caused by a respiratory virus. The flu virus is highly contagious and persons who did not receive their yearly flu vaccination may "catch" the flu from close contact.  We have anti-viral medications to treat the viruses that cause this infection. They are not a "cure" and only shorten the course of the infection. These prescriptions are most effective when they are given within the first 2 days of "flu" symptoms. Antiviral medication are indicated if you have a high risk of complications from the flu. You should  also consider an antiviral medication if you are in close contact with someone who is at risk. These medications can help patients avoid complications from the flu  but have side effects that you should know. Possible side effects from Tamiflu or oseltamivir include nausea, vomiting, diarrhea, dizziness, headaches, eye redness, sleep problems or other respiratory symptoms. You should not take Tamiflu if you have an allergy to oseltamivir or any to the ingredients in Tamiflu.  Based upon your symptoms and potential risk factors I have prescribed Oseltamivir (Tamiflu).  It has been sent to your designated pharmacy.  You will take one 75 mg capsule orally twice a day for the next 5 days.  I have also prescribed Tessalon (Benzonatate) 100 mg capsules Take 1-2 capsules every 8 hours as needed for cough.   ANYONE WHO HAS FLU SYMPTOMS SHOULD: Stay home. The flu is highly contagious and going out or to work exposes others! Be sure to drink plenty of fluids. Water is fine as well as fruit juices, sodas and electrolyte beverages. You may want to stay away from caffeine or alcohol. If you are nauseated, try taking small sips of liquids. How do you  know if you are getting enough fluid? Your urine should be a pale yellow or almost colorless. Get rest. Taking a steamy shower or using a humidifier may help nasal congestion and ease sore throat pain. Using a saline nasal spray works much the same way. Cough drops, hard candies and sore throat lozenges may ease your cough. Line up a caregiver. Have someone check on you regularly.   GET HELP RIGHT AWAY IF: You cannot keep down liquids or your medications. You become short of breath Your fell like you are going to pass out or loose consciousness. Your symptoms persist after you have completed your treatment plan MAKE SURE YOU  Understand these instructions. Will watch your condition. Will get help right away if you are not doing well or get worse.  Your e-visit answers were reviewed by a board certified advanced clinical practitioner to complete your personal care plan.  Depending on the condition, your plan could have included both over the counter or prescription medications.  If there is a problem please reply  once you have received a response from your provider.  Your safety is important to [place].  If you have drug allergies check your prescription carefully.    You can use MyChart to ask questions about today's visit, request a non-urgent call back, or ask for a work or school excuse for 24 hours related to this e-Visit. If it has been greater than 24 hours you will need to follow up  with your provider, or enter a new e-Visit to address those concerns.  You will get an e-mail in the next two days asking about your experience.  I hope that your e-visit has been valuable and will speed your recovery. Thank you for using e-visits.   I have spent 5 minutes in review of e-visit questionnaire, review and updating patient chart, medical decision making and response to patient.   Margaretann Loveless, PA-C

## 2023-12-09 ENCOUNTER — Other Ambulatory Visit: Payer: Self-pay | Admitting: Family Medicine

## 2023-12-09 ENCOUNTER — Encounter: Payer: Self-pay | Admitting: Family Medicine

## 2023-12-09 NOTE — Telephone Encounter (Signed)
Looks like it.

## 2023-12-09 NOTE — Telephone Encounter (Signed)
 Pt has not been seen in office in over a year, has had a few virtual visits. Okay for refill?

## 2023-12-19 ENCOUNTER — Other Ambulatory Visit: Payer: Self-pay | Admitting: Family Medicine

## 2023-12-19 DIAGNOSIS — M545 Low back pain, unspecified: Secondary | ICD-10-CM

## 2023-12-19 MED ORDER — OXYCODONE HCL 10 MG PO TABS: 5.0000 mg | ORAL_TABLET | Freq: Four times a day (QID) | ORAL | 0 refills | Status: DC

## 2023-12-19 MED ORDER — OXYCODONE HCL 10 MG PO TABS
5.0000 mg | ORAL_TABLET | Freq: Four times a day (QID) | ORAL | 0 refills | Status: DC
Start: 2024-01-18 — End: 2024-02-14

## 2023-12-30 ENCOUNTER — Telehealth: Payer: Self-pay | Admitting: Family Medicine

## 2023-12-30 DIAGNOSIS — G8929 Other chronic pain: Secondary | ICD-10-CM

## 2023-12-30 NOTE — Telephone Encounter (Signed)
Referral was placed again.

## 2023-12-30 NOTE — Addendum Note (Signed)
 Addended by: Cheron Every C on: 12/30/2023 05:00 PM   Modules accepted: Orders

## 2023-12-30 NOTE — Telephone Encounter (Signed)
 Copied from CRM [id number]. Topic: General - Other >> Dec 30, 2023  2:54 PM Whitney O wrote: Reason for CRM: physical medicine and rehab is requesting to speak with someone in referrals . Its the one for pain medicine we need a updated referral because the other one expired and she come in tomorrow .spoke to cal and  Referral coordinators  are remote.  Was told to send a messge high priority [facility]   [phone]

## 2023-12-31 ENCOUNTER — Encounter: Admitting: Physical Medicine and Rehabilitation

## 2024-01-21 ENCOUNTER — Encounter: Payer: Self-pay | Admitting: Family Medicine

## 2024-01-21 ENCOUNTER — Other Ambulatory Visit: Payer: Self-pay

## 2024-02-11 ENCOUNTER — Encounter: Payer: Self-pay | Admitting: Family Medicine

## 2024-02-14 ENCOUNTER — Encounter: Payer: Self-pay | Admitting: Family Medicine

## 2024-02-14 ENCOUNTER — Ambulatory Visit: Admitting: Family Medicine

## 2024-02-14 VITALS — BP 112/68 | HR 72 | Temp 98.0°F | Resp 16 | Ht 65.0 in | Wt 188.0 lb

## 2024-02-14 DIAGNOSIS — S76311A Strain of muscle, fascia and tendon of the posterior muscle group at thigh level, right thigh, initial encounter: Secondary | ICD-10-CM

## 2024-02-14 DIAGNOSIS — S86811A Strain of other muscle(s) and tendon(s) at lower leg level, right leg, initial encounter: Secondary | ICD-10-CM

## 2024-02-14 DIAGNOSIS — G8929 Other chronic pain: Secondary | ICD-10-CM

## 2024-02-14 DIAGNOSIS — M545 Low back pain, unspecified: Secondary | ICD-10-CM | POA: Diagnosis not present

## 2024-02-14 MED ORDER — PREDNISONE 20 MG PO TABS: 40.0000 mg | ORAL_TABLET | Freq: Every day | ORAL | 0 refills | Status: AC

## 2024-02-14 MED ORDER — OXYCODONE HCL 10 MG PO TABS: 5.0000 mg | ORAL_TABLET | Freq: Four times a day (QID) | ORAL | 0 refills | Status: DC

## 2024-02-14 NOTE — Patient Instructions (Signed)
 Heat (pad or rice pillow in microwave) over affected area, 10-15 minutes twice daily.   Ice/cold pack over area for 10-15 min twice daily.  OK to take Tylenol 1000 mg (2 extra strength tabs) or 975 mg (3 regular strength tabs) every 6 hours as needed.  Let us know if you need anything.

## 2024-02-14 NOTE — Progress Notes (Signed)
 Musculoskeletal Exam  Patient: Rachel [name] DOB: [birth year]  DOS: 02/14/2024  SUBJECTIVE:  Chief Complaint:   Chief Complaint  Patient presents with   Joint Swelling    Right Knee Swelling and Pain    Rachel [name] is a 43 y.o.  female for evaluation and treatment of R knee pain.   Onset:  2 weeks ago. No inj or change in activity.  Location: back of knee Character:  aching  Progression of issue:  has worsened Associated symptoms: swelling; bending hurts, walking is OK No bruising, redness, catching/locking Treatment: to date has been ice and Motrin , elevation.   Neurovascular symptoms: no  Past Medical History:  Diagnosis Date   Allergy    Anxiety    Back pain    Depression    Fever blister 02/12/2016   Migraines     Objective: VITAL SIGNS: BP 112/68 (BP Location: Left Arm, Patient Position: Sitting)   Pulse 72   Temp 98 F (36.7 C) (Oral)   Resp 16   Ht 5\' 5"  (1.651 m)   Wt 188 lb (85.3 kg)   SpO2 98%   BMI 31.28 kg/m  Constitutional: Well formed, well developed. No acute distress. Thorax & Lungs: No accessory muscle use Musculoskeletal: R knee.   Normal active range of motion: yes.   Normal passive range of motion: yes Tenderness to palpation: Yes over proximal calf and distal hamstring in addition of the popliteal region No joint line tenderness Deformity: no Ecchymosis: no Tests positive: None Tests negative: Negative Lachman's, Stines, patellar apprehension/grind, varus/valgus stress Neurologic: Normal sensory function.  Antalgic gait. Psychiatric: Normal mood. Age appropriate judgment and insight. Alert & oriented x 3.    Assessment:  Hamstring strain, right, initial encounter - Plan: predniSONE  (DELTASONE ) 20 MG tablet  Strain of calf muscle, right, initial encounter - Plan: predniSONE  (DELTASONE ) 20 MG tablet  Chronic low back pain without sciatica, unspecified back pain laterality - Plan: Oxycodone  HCl 10 MG  TABS  Plan: Stretches/exercises, heat, ice, Tylenol .  5-day prednisone  burst 40 mg daily.  If not having significant improvement in the next 3 to 4 weeks, she will send me a message and we will get her in with the physical therapy team. F/u as originally scheduled. The patient voiced understanding and agreement to the plan.   Rachel Dials [place], DO 02/14/24  4:25 PM

## 2024-02-17 ENCOUNTER — Encounter: Payer: Self-pay | Admitting: Family Medicine

## 2024-02-17 ENCOUNTER — Ambulatory Visit
Admission: RE | Admit: 2024-02-17 | Discharge: 2024-02-17 | Disposition: A | Discharge status: Home / Self Care | Source: Ambulatory Visit | Attending: Family Medicine

## 2024-02-17 ENCOUNTER — Other Ambulatory Visit: Payer: Self-pay

## 2024-02-17 DIAGNOSIS — M25461 Effusion, right knee: Secondary | ICD-10-CM | POA: Diagnosis not present

## 2024-02-17 DIAGNOSIS — M25569 Pain in unspecified knee: Secondary | ICD-10-CM

## 2024-02-17 DIAGNOSIS — M25561 Pain in right knee: Secondary | ICD-10-CM

## 2024-02-18 ENCOUNTER — Other Ambulatory Visit: Payer: Self-pay

## 2024-02-18 ENCOUNTER — Telehealth: Admitting: Physician Assistant

## 2024-02-18 DIAGNOSIS — A6004 Herpesviral vulvovaginitis: Secondary | ICD-10-CM

## 2024-02-18 DIAGNOSIS — M25561 Pain in right knee: Secondary | ICD-10-CM

## 2024-02-18 MED ORDER — VALACYCLOVIR HCL 500 MG PO TABS: 500.0000 mg | ORAL_TABLET | Freq: Two times a day (BID) | ORAL | 0 refills | Status: DC

## 2024-02-18 NOTE — Progress Notes (Signed)
 E-Visit for Herpes Simplex  We are sorry that you are not feeling well.  Here is how we plan to help!  Based on what you have shared ith me, it looks like you may be having an outbreak/flare-up of genital herpes.    I have prescribed I have prescribed Valacyclovir 500 mg Take one by mouth twice a day for 3 days.    If you have been prescribed long term medications to be taken on a regular basis, it is important to follow the recommendations and take them as ordered.    Outbreaks usually include blisters and open sores in the genital area. Outbreaks that happen after the first time are usually not as severe and do not last as long. Genital Herpes Simplex is a commonly sexually transmitted viral infection that is found worldwide. Most of these genital infections are caused by one or two herpes simplex viruses that is passed from person to person during vaginal, oral, or anal sex. Sometimes, people do not know they have herpes because they do not have any symptoms.  Please be aware that if you have genital herpes you can be contagious even when you are not having rash or flare-up and you may not have any symptoms, even when you are taking suppressive medicines.  Herpes cannot be cured. The disease usually causes most problems during the first few years. After that, the virus is still there, but it causes few to no symptoms. Even when the virus is active, people with herpes can take medicines to reduce and help prevent symptoms.  Herpes is an infection that can cause blisters and open sores on the genital area. Herpes is caused by a virus that is passed from person to person during vaginal, oral, or anal sex. Sometimes, people do not know they have herpes because they do not have any symptoms. Herpes cannot be cured. The disease usually causes most problems during the first few years. After that, the virus is still there, but it causes few to no symptoms. Even when the virus is active, people with herpes  can take medicines to reduce and help prevent symptoms.  If you have been prescribed medications to be taken on a regular basis, it is important to follow the recommendations and take them as ordered.  Some people with herpes never have any symptoms. But other people can develop symptoms within a few weeks of being infected with the herpes virus   Symptoms usually include blisters in the genital area. In women, this area includes the vagina, buttocks, anus, or thighs. In men, this area includes the penis, scrotum, anus, butt, or thighs. The blisters can become painful open sores, which then crust over as they heal. Sometimes, people can have other symptoms that include:  ?Blisters on the mouth or lips ?Fever, headache, or pain in the joints ?Trouble urinating  Outbreaks might occur every month or more often, or just once or twice a year. Sometimes, people can tell when an outbreak will occur, because they feel itching or pain beforehand. Sometimes they do not know that an outbreak is coming because they have no symptoms. Whatever your pattern is, keep in mind that herpes outbreaks usually become less frequent over time as you get older. Certain things, called "triggers," can make outbreaks more likely to occur. These include stress, sunlight, menstrual periods,or getting sick.  Antiviral therapy can shorten the duration of symptoms and signs in primary infection, which, when untreated, can be associated with significant increase in the  symptoms of the disease.  HOME CARE Use a portable bath (such as a "Sitz bath") where you can sit in warm water for about 20 minutes. Your bathtub could also work. Avoid bubble baths.  Keep the genital area clean and dry and avoid tight clothes.  Take over-the-counter pain medicine such as acetaminophen (brand name: Tylenol) or ibuprofen sample brand names: Advil, Motrin). But avoid aspirin.  Only take medications as instructed by your medical team.  You are  most likely to spread herpes to a sex partner when you have blisters and open sores on your body. But it's also possible to spread herpes to your partner when you do not have any symptoms. That is because herpes can be present on your body without causing any symptoms, like blisters or pain.  Telling your sex partner that you have herpes can be hard. But it can help protect them, since there are ways to lower the risk of spreading the infection.   Using a condom every time you have sex  Not having sex when you have symptoms  Not having oral sex if you have blisters or open sores (in the genital area or around your mouth)  MAKE SURE YOU   Understand these instructions. Do not have sex without using a condom until you have been seen by a doctor and as instructed by the provider If you are not better or improved within 7 days, you MUST have a follow up at your doctor or the health department for evaluation. There are other causes of rashes in the genital region.  Thank you for choosing an e-visit.  Your e-visit answers were reviewed by a board certified advanced clinical practitioner to complete your personal care plan. Depending upon the condition, your plan could have included both over the counter or prescription medications.  Please review your pharmacy choice. Make sure the pharmacy is open so you can pick up prescription now. If there is a problem, you may contact your provider through [employer] and have the prescription routed to another pharmacy.  Your safety is important to [place]. If you have drug allergies check your prescription carefully.   For the next 24 hours you can use MyChart to ask questions about today's visit, request a non-urgent call back, or ask for a work or school excuse. You will get an email in the next two days asking about your experience. I hope that your e-visit has been valuable and will speed your recovery.

## 2024-02-18 NOTE — Progress Notes (Signed)
 I have spent 5 minutes in review of e-visit questionnaire, review and updating patient chart, medical decision making and response to patient.   Piedad Climes, PA-C

## 2024-02-18 NOTE — Addendum Note (Signed)
 Addended by: Marylou Sobers D on: 02/18/2024 01:41 PM   Modules accepted: Orders

## 2024-02-19 ENCOUNTER — Ambulatory Visit: Admitting: Sports Medicine

## 2024-02-22 ENCOUNTER — Ambulatory Visit: Payer: Self-pay | Admitting: Family Medicine

## 2024-02-24 DIAGNOSIS — M25561 Pain in right knee: Secondary | ICD-10-CM | POA: Diagnosis not present

## 2024-03-03 ENCOUNTER — Encounter: Attending: Physical Medicine and Rehabilitation | Admitting: Physical Medicine and Rehabilitation

## 2024-03-04 DIAGNOSIS — M25361 Other instability, right knee: Secondary | ICD-10-CM | POA: Diagnosis not present

## 2024-03-04 DIAGNOSIS — M25561 Pain in right knee: Secondary | ICD-10-CM | POA: Diagnosis not present

## 2024-03-13 ENCOUNTER — Encounter: Payer: Self-pay | Admitting: Family Medicine

## 2024-03-13 ENCOUNTER — Telehealth: Admitting: Family Medicine

## 2024-03-13 DIAGNOSIS — M545 Low back pain, unspecified: Secondary | ICD-10-CM

## 2024-03-13 DIAGNOSIS — G8929 Other chronic pain: Secondary | ICD-10-CM | POA: Diagnosis not present

## 2024-03-13 MED ORDER — OXYCODONE HCL 10 MG PO TABS: 5.0000 mg | ORAL_TABLET | Freq: Four times a day (QID) | ORAL | 0 refills | Status: DC

## 2024-03-13 NOTE — Progress Notes (Signed)
 Chief Complaint  Patient presents with   Medication Refill    Medication Refill     Subjective: Patient is a 43 y.o. female here for fu. We are interacting via web portal for an electronic face-to-face visit. I verified patient's ID using 2 identifiers. Patient agreed to proceed with visit via this method. Patient is at home, I am at office. Patient and I are present for visit.   Pt w hx of chronic low back pain after an injury. She takes oxycodone  5-10 mg TID prn. No AE's. She was referred to pain management but was unable to go to do insurance issues. Interested in coming off. Has failed gabapentin , pregabalin , and duloxetine . On amitriptyline  and Zoloft .   Past Medical History:  Diagnosis Date   Allergy    Anxiety    Back pain    Depression    Fever blister 02/12/2016   Migraines     Objective: No conversational dyspnea Age appropriate judgment and insight Nml affect and mood  Assessment and Plan: Chronic low back pain without sciatica, unspecified back pain laterality - Plan: Oxycodone  HCl 10 MG TABS, Ambulatory referral to Pain Clinic  Chronic, relatively stable. Refer to new pain management. Refill oxy for now. F/u in 1 mo as she wishes to start weaning down at that time.  The patient voiced understanding and agreement to the plan.  Shellie Dials [place], DO 03/13/24  12:45 PM

## 2024-03-13 NOTE — Telephone Encounter (Signed)
 Called spoke with Pharmacy about oxycodone  at [facility].  The pharmacist said pt got med on 5/13. Pt was advised she shouldn't be  Out of the medication and she stated understand. Advised her she could pick up medication on 03/17/24. Provider was advised.

## 2024-03-24 ENCOUNTER — Ambulatory Visit: Admitting: Sports Medicine

## 2024-03-24 ENCOUNTER — Telehealth: Admitting: Physician Assistant

## 2024-03-24 DIAGNOSIS — J4531 Mild persistent asthma with (acute) exacerbation: Secondary | ICD-10-CM

## 2024-03-24 DIAGNOSIS — J208 Acute bronchitis due to other specified organisms: Secondary | ICD-10-CM | POA: Diagnosis not present

## 2024-03-24 DIAGNOSIS — B9689 Other specified bacterial agents as the cause of diseases classified elsewhere: Secondary | ICD-10-CM

## 2024-03-24 MED ORDER — PREDNISONE 20 MG PO TABS
40.0000 mg | ORAL_TABLET | Freq: Every day | ORAL | 0 refills | Status: DC
Start: 2024-03-24 — End: 2024-07-01

## 2024-03-24 MED ORDER — AZITHROMYCIN 250 MG PO TABS: ORAL_TABLET | ORAL | 0 refills | Status: AC

## 2024-03-24 MED ORDER — BENZONATATE 100 MG PO CAPS: 100.0000 mg | ORAL_CAPSULE | Freq: Three times a day (TID) | ORAL | 0 refills | Status: DC | PRN

## 2024-03-24 NOTE — Progress Notes (Signed)
E-Visit for Cough   We are sorry that you are not feeling well.  Here is how we plan to help!  Based on your presentation I believe you most likely have A cough due to bacteria.  When patients have a fever and a productive cough with a change in color or increased sputum production, we are concerned about bacterial bronchitis.  If left untreated it can progress to pneumonia.  If your symptoms do not improve with your treatment plan it is important that you contact your provider.   I have prescribed Azithromyin 250 mg: two tablets now and then one tablet daily for 4 additonal days    In addition you may use A non-prescription cough medication called Mucinex DM: take 2 tablets every 12 hours. and A prescription cough medication called Tessalon Perles 100mg . You may take 1-2 capsules every 8 hours as needed for your cough.  Prednisone 20mg  Take 2 tablets (40mg ) daily for 5 days.  From your responses in the eVisit questionnaire you describe inflammation in the upper respiratory tract which is causing a significant cough.  This is commonly called Bronchitis and has four common causes:   Allergies Viral Infections Acid Reflux Bacterial Infection Allergies, viruses and acid reflux are treated by controlling symptoms or eliminating the cause. An example might be a cough caused by taking certain blood pressure medications. You stop the cough by changing the medication. Another example might be a cough caused by acid reflux. Controlling the reflux helps control the cough.  USE OF BRONCHODILATOR ("RESCUE") INHALERS: There is a risk from using your bronchodilator too frequently.  The risk is that over-reliance on a medication which only relaxes the muscles surrounding the breathing tubes can reduce the effectiveness of medications prescribed to reduce swelling and congestion of the tubes themselves.  Although you feel brief relief from the bronchodilator inhaler, your asthma may actually be worsening with the  tubes becoming more swollen and filled with mucus.  This can delay other crucial treatments, such as oral steroid medications. If you need to use a bronchodilator inhaler daily, several times per day, you should discuss this with your provider.  There are probably better treatments that could be used to keep your asthma under control.     HOME CARE Only take medications as instructed by your medical team. Complete the entire course of an antibiotic. Drink plenty of fluids and get plenty of rest. Avoid close contacts especially the very young and the elderly Cover your mouth if you cough or cough into your sleeve. Always remember to wash your hands A steam or ultrasonic humidifier can help congestion.   GET HELP RIGHT AWAY IF: You develop worsening fever. You become short of breath You cough up blood. Your symptoms persist after you have completed your treatment plan MAKE SURE YOU  Understand these instructions. Will watch your condition. Will get help right away if you are not doing well or get worse.    Thank you for choosing an e-visit.  Your e-visit answers were reviewed by a board certified advanced clinical practitioner to complete your personal care plan. Depending upon the condition, your plan could have included both over the counter or prescription medications.  Please review your pharmacy choice. Make sure the pharmacy is open so you can pick up prescription now. If there is a problem, you may contact your provider through [employer] and have the prescription routed to another pharmacy.  Your safety is important to [place]. If you have drug  allergies check your prescription carefully.   For the next 24 hours you can use MyChart to ask questions about today's visit, request a non-urgent call back, or ask for a work or school excuse. You will get an email in the next two days asking about your experience. I hope that your e-visit has been valuable and will speed your  recovery.  I have spent 5 minutes in review of e-visit questionnaire, review and updating patient chart, medical decision making and response to patient.   Margaretann Loveless, PA-C

## 2024-04-09 ENCOUNTER — Encounter: Payer: Self-pay | Admitting: Family Medicine

## 2024-04-09 ENCOUNTER — Other Ambulatory Visit: Payer: Self-pay | Admitting: Family Medicine

## 2024-04-09 DIAGNOSIS — M545 Low back pain, unspecified: Secondary | ICD-10-CM

## 2024-04-09 MED ORDER — OXYCODONE HCL 10 MG PO TABS: 5.0000 mg | ORAL_TABLET | Freq: Four times a day (QID) | ORAL | 0 refills | Status: DC

## 2024-04-09 NOTE — Telephone Encounter (Signed)
 Requesting: oxycodone  10mg  Contract: 08/22/22 UDS: 08/22/22 Last Visit: 03/13/24 Next Visit: None Last Refill: 03/13/24 #120 and 0RF   Please Advise

## 2024-04-28 ENCOUNTER — Other Ambulatory Visit: Payer: Self-pay

## 2024-04-28 ENCOUNTER — Encounter: Payer: Self-pay | Admitting: Family Medicine

## 2024-04-28 DIAGNOSIS — E538 Deficiency of other specified B group vitamins: Secondary | ICD-10-CM

## 2024-04-28 MED ORDER — SERTRALINE HCL 100 MG PO TABS: 200.0000 mg | ORAL_TABLET | Freq: Every day | ORAL | 1 refills | Status: DC

## 2024-04-28 NOTE — Addendum Note (Signed)
 Addended by: DORLENE CHIQUITA RAMAN on: 04/28/2024 02:24 PM   Modules accepted: Orders

## 2024-04-28 NOTE — Telephone Encounter (Signed)
 Ok to place b12 lab ?

## 2024-05-08 ENCOUNTER — Encounter: Payer: Self-pay | Admitting: Family Medicine

## 2024-05-21 ENCOUNTER — Ambulatory Visit: Payer: Self-pay | Admitting: *Deleted

## 2024-05-21 NOTE — Telephone Encounter (Signed)
 FYI Only or Action Required?: Action required by provider: request for appointment and clinical question for provider.  Patient was last seen in primary care on 03/13/2024 by Frann Mabel Mt, DO.  Called Nurse Triage reporting Foot Swelling.  Symptoms began several days ago.  Interventions attempted: Rest, hydration, or home remedies.  Symptoms are: gradually worsening.  Triage Disposition: See PCP When Office is Open (Within 3 Days)  Patient/caregiver understands and will follow disposition?: No, wishes to speak with PCP           Copied from CRM #[id number]. Topic: Clinical - Red Word Triage >> May 21, 2024  1:16 PM Dedra B wrote: [facility] that prompted transfer to Nurse Triage: Pt has swelling in R foot consistently for past 3 days. States that it is pitting when she presses on it. Warm transfer to [facility] triage nurse. Reason for Disposition  MODERATE ankle swelling (e.g., interferes with normal activities, can't move joint normally) (Exceptions: Itchy, localized swelling; swelling is chronic.)  Answer Assessment - Initial Assessment Questions Offered patient appt tomorrow and CAL offered appt today with other provider in office and patient declined. Patient was requesting to go to [facility] or [place] due to drive time. Due to reported weigh gain from another provider office of 189 lbs and day later patient checked weigh with her scales for 196 lbs today. Reports swelling in right foot and ankle only  pitting edema with varicose veins present. No appt scheduled at this time. Please advise. Recommended patient weigh self on same scale and if more than 2-3 lbs weigh gain call back. Recommended any change in sx go to ED.      1. LOCATION: Which ankle is swollen? Where is the swelling?     Right foot and ankle  2. ONSET: When did the swelling start?     3 days  3. SWELLING: How bad is the swelling? Or, How large is it? (e.g., mild, moderate, severe;  size of localized swelling)      1 times more swelling in right foot than left foot 4. PAIN: Is there any pain? If Yes, ask: How bad is it? (Scale 0-10; or none, mild, moderate, severe)     Minimal pain soreness to touch due pitting edema  5. CAUSE: What do you think caused the ankle swelling?     Not sure possible knee pain 6. OTHER SYMPTOMS: Do you have any other symptoms? (e.g., fever, chest pain, difficulty breathing, calf pain)     No , noted weight gain 189 lbs at another Dr office and weighed  196 lbs on patient's home scale. Unsure of accuracy of weigh gain. No other sx reports no chest pain no SOB  7. PREGNANCY: Is there any chance you are pregnant? When was your last menstrual period?     na  Protocols used: Ankle Swelling-A-AH

## 2024-05-25 ENCOUNTER — Ambulatory Visit: Admitting: Family Medicine

## 2024-05-26 ENCOUNTER — Encounter: Payer: Self-pay | Admitting: Family Medicine

## 2024-05-26 ENCOUNTER — Telehealth (INDEPENDENT_AMBULATORY_CARE_PROVIDER_SITE_OTHER): Admitting: Family Medicine

## 2024-05-26 ENCOUNTER — Ambulatory Visit
Admission: RE | Admit: 2024-05-26 | Discharge: 2024-05-26 | Disposition: A | Discharge status: Home / Self Care | Source: Ambulatory Visit | Attending: Family Medicine | Admitting: Family Medicine

## 2024-05-26 ENCOUNTER — Ambulatory Visit: Payer: Self-pay | Admitting: Family Medicine

## 2024-05-26 DIAGNOSIS — M79661 Pain in right lower leg: Secondary | ICD-10-CM

## 2024-05-26 DIAGNOSIS — M79604 Pain in right leg: Secondary | ICD-10-CM | POA: Diagnosis not present

## 2024-05-26 DIAGNOSIS — M7989 Other specified soft tissue disorders: Secondary | ICD-10-CM | POA: Diagnosis not present

## 2024-05-26 DIAGNOSIS — M7121 Synovial cyst of popliteal space [Baker], right knee: Secondary | ICD-10-CM | POA: Diagnosis not present

## 2024-05-26 NOTE — Progress Notes (Signed)
 Musculoskeletal Exam  Patient: Rachel [name] DOB: [birth year]  DOS: 05/26/2024  SUBJECTIVE:  Chief Complaint:   CC: Foot swelling  ZANYLA KLEBBA is a 43 y.o.  female for evaluation and treatment of R foot pain. We are interacting via web portal for an electronic face-to-face visit. I verified patient's ID using 2 identifiers. Patient agreed to proceed with visit via this method. Patient is at home, I am at office. Patient and I are present for visit.   Onset:  1 month ago. No inj or change in activity.  Location: R foot/ankle Character:  soreness  Progression of issue:  waxes and wanes Associated symptoms: skin stay indented after pushing on it; varicose veins, fatigue No fevers, redness, bruising, calf pain, SOB, recent travel, prolonged bedrest, history of DVT Treatment: to date has been rest, OTC NSAIDS, and ice.   Neurovascular symptoms: no  Past Medical History:  Diagnosis Date   Allergy    Anxiety    Back pain    Depression    Fever blister 02/12/2016   Migraines     Objective: No conversational dyspnea Age appropriate judgment and insight Nml affect and mood  Assessment:  Right leg swelling - Plan: US  Venous Img Lower Unilateral Right  Right calf pain - Plan: US  Venous Img Lower Unilateral Right  Plan: New problem with uncertain prognosis.  She does not have a lot of risk factors but her right lower extremity is visibly larger than her left.  Had her push on her inner calf and she did have pain to palpation.  Will check lower extremity ultrasound to rule out a DVT.  Will consider musculoskeletal treatment with rehab, heat/ice, Tylenol  and compression stockings. The patient voiced understanding and agreement to the plan.   Mabel Mt [place], DO 05/26/24  3:23 PM

## 2024-06-04 NOTE — Telephone Encounter (Signed)
 Set for 06/11/24 -- okay to refill early?

## 2024-06-05 ENCOUNTER — Encounter: Payer: Self-pay | Admitting: Family Medicine

## 2024-06-05 NOTE — Telephone Encounter (Signed)
Refer to other mychart

## 2024-06-15 ENCOUNTER — Telehealth: Admitting: Physician Assistant

## 2024-06-15 DIAGNOSIS — A6004 Herpesviral vulvovaginitis: Secondary | ICD-10-CM

## 2024-06-15 MED ORDER — VALACYCLOVIR HCL 500 MG PO TABS: 500.0000 mg | ORAL_TABLET | Freq: Two times a day (BID) | ORAL | 0 refills | Status: AC

## 2024-06-15 NOTE — Progress Notes (Signed)
 E-Visit for Herpes Simplex  We are sorry that you are not feeling well.  Here is how we plan to help!  Based on what you have shared ith me, it looks like you may be having an outbreak/flare-up of genital herpes.    I have prescribed I have prescribed Valacyclovir 500 mg Take one by mouth twice a day for 3 days.    If you have been prescribed long term medications to be taken on a regular basis, it is important to follow the recommendations and take them as ordered.    Outbreaks usually include blisters and open sores in the genital area. Outbreaks that happen after the first time are usually not as severe and do not last as long. Genital Herpes Simplex is a commonly sexually transmitted viral infection that is found worldwide. Most of these genital infections are caused by one or two herpes simplex viruses that is passed from person to person during vaginal, oral, or anal sex. Sometimes, people do not know they have herpes because they do not have any symptoms.  Please be aware that if you have genital herpes you can be contagious even when you are not having rash or flare-up and you may not have any symptoms, even when you are taking suppressive medicines.  Herpes cannot be cured. The disease usually causes most problems during the first few years. After that, the virus is still there, but it causes few to no symptoms. Even when the virus is active, people with herpes can take medicines to reduce and help prevent symptoms.  Herpes is an infection that can cause blisters and open sores on the genital area. Herpes is caused by a virus that is passed from person to person during vaginal, oral, or anal sex. Sometimes, people do not know they have herpes because they do not have any symptoms. Herpes cannot be cured. The disease usually causes most problems during the first few years. After that, the virus is still there, but it causes few to no symptoms. Even when the virus is active, people with herpes  can take medicines to reduce and help prevent symptoms.  If you have been prescribed medications to be taken on a regular basis, it is important to follow the recommendations and take them as ordered.  Some people with herpes never have any symptoms. But other people can develop symptoms within a few weeks of being infected with the herpes virus   Symptoms usually include blisters in the genital area. In women, this area includes the vagina, buttocks, anus, or thighs. In men, this area includes the penis, scrotum, anus, butt, or thighs. The blisters can become painful open sores, which then crust over as they heal. Sometimes, people can have other symptoms that include:  ?Blisters on the mouth or lips ?Fever, headache, or pain in the joints ?Trouble urinating  Outbreaks might occur every month or more often, or just once or twice a year. Sometimes, people can tell when an outbreak will occur, because they feel itching or pain beforehand. Sometimes they do not know that an outbreak is coming because they have no symptoms. Whatever your pattern is, keep in mind that herpes outbreaks usually become less frequent over time as you get older. Certain things, called "triggers," can make outbreaks more likely to occur. These include stress, sunlight, menstrual periods,or getting sick.  Antiviral therapy can shorten the duration of symptoms and signs in primary infection, which, when untreated, can be associated with significant increase in the  symptoms of the disease.  HOME CARE Use a portable bath (such as a "Sitz bath") where you can sit in warm water for about 20 minutes. Your bathtub could also work. Avoid bubble baths.  Keep the genital area clean and dry and avoid tight clothes.  Take over-the-counter pain medicine such as acetaminophen (brand name: Tylenol) or ibuprofen sample brand names: Advil, Motrin). But avoid aspirin.  Only take medications as instructed by your medical team.  You are  most likely to spread herpes to a sex partner when you have blisters and open sores on your body. But it's also possible to spread herpes to your partner when you do not have any symptoms. That is because herpes can be present on your body without causing any symptoms, like blisters or pain.  Telling your sex partner that you have herpes can be hard. But it can help protect them, since there are ways to lower the risk of spreading the infection.   Using a condom every time you have sex  Not having sex when you have symptoms  Not having oral sex if you have blisters or open sores (in the genital area or around your mouth)  MAKE SURE YOU   Understand these instructions. Do not have sex without using a condom until you have been seen by a doctor and as instructed by the provider If you are not better or improved within 7 days, you MUST have a follow up at your doctor or the health department for evaluation. There are other causes of rashes in the genital region.  Thank you for choosing an e-visit.  Your e-visit answers were reviewed by a board certified advanced clinical practitioner to complete your personal care plan. Depending upon the condition, your plan could have included both over the counter or prescription medications.  Please review your pharmacy choice. Make sure the pharmacy is open so you can pick up prescription now. If there is a problem, you may contact your provider through [employer] and have the prescription routed to another pharmacy.  Your safety is important to [place]. If you have drug allergies check your prescription carefully.   For the next 24 hours you can use MyChart to ask questions about today's visit, request a non-urgent call back, or ask for a work or school excuse. You will get an email in the next two days asking about your experience. I hope that your e-visit has been valuable and will speed your recovery.   I have spent 5 minutes in review of e-visit  questionnaire, review and updating patient chart, medical decision making and response to patient.   Margaretann Loveless, PA-C

## 2024-07-01 ENCOUNTER — Encounter: Payer: Self-pay | Admitting: Family Medicine

## 2024-07-01 ENCOUNTER — Telehealth: Admitting: Family Medicine

## 2024-07-01 DIAGNOSIS — M545 Low back pain, unspecified: Secondary | ICD-10-CM

## 2024-07-01 DIAGNOSIS — F411 Generalized anxiety disorder: Secondary | ICD-10-CM

## 2024-07-01 DIAGNOSIS — E669 Obesity, unspecified: Secondary | ICD-10-CM

## 2024-07-01 DIAGNOSIS — G8929 Other chronic pain: Secondary | ICD-10-CM

## 2024-07-01 MED ORDER — SERTRALINE HCL 100 MG PO TABS: 200.0000 mg | ORAL_TABLET | Freq: Every day | ORAL | 1 refills | Status: AC

## 2024-07-01 MED ORDER — OXYCODONE HCL 10 MG PO TABS: 5.0000 mg | ORAL_TABLET | Freq: Four times a day (QID) | ORAL | 0 refills | Status: DC

## 2024-07-01 MED ORDER — PHENTERMINE HCL 37.5 MG PO CAPS: 37.5000 mg | ORAL_CAPSULE | ORAL | 0 refills | Status: AC

## 2024-07-01 NOTE — Progress Notes (Signed)
 CC: F/u  Subjective: Patient is a 43 y.o. female here for follow-up. We are interacting via web portal for an electronic face-to-face visit. I verified patient's ID using 2 identifiers. Patient agreed to proceed with visit via this method. Patient is at home, I am at office. Patient and I are present for visit.   Patient has a history of depression/anxiety.  She is not seeing a therapist.  No homicidal or suicidal ideation.  No self-medication.  She is taking Zoloft  200 mg daily.  Compliant, no adverse effects.  Her mood fluctuates.  Overall is well-controlled.  During her cycle, she has more severe mood swings.  Patient has a history of chronic pain.  She was referred to a pain specialist but has not heard back. She is currently taking oxycodone  5 to 10 mg 3 times daily as needed for pain.  No adverse effects.  Compliant.  Overall helpful.  Patient has been struggling with her weight.  Diet is overall healthy.  She walks for exercise.  She has been dealing with some knee pain which has limited her physical activity.  She weighs around 196 pounds.  She is not interested in shots nor does she think her insurance will cover it.  She tried phentermine  around 5 years ago which she reports was helpful and she had no adverse effects.  She is no longer taking Adderall, over the past couple years.  Past Medical History:  Diagnosis Date   Allergy    Anxiety    Back pain    Depression    Fever blister 02/12/2016   Migraines     Objective: No conversational dyspnea Age appropriate judgment and insight Nml affect and mood  Assessment and Plan: Chronic low back pain without sciatica, unspecified back pain laterality - Plan: Oxycodone  HCl 10 MG TABS, Oxycodone  HCl 10 MG TABS, Oxycodone  HCl 10 MG TABS  GAD (generalized anxiety disorder) - Plan: sertraline  (ZOLOFT ) 100 MG tablet, Ambulatory referral to Psychology  Obesity (BMI 30-39.9) - Plan: phentermine  37.5 MG capsule  Chronic, stable.  Continue  oxycodone  5 to 10 mg 3 times daily as needed.  We will look into her pain clinic referral. Chronic, stable.  Continue Zoloft  200 mg daily.  Refer to the counseling team to perhaps provide overall relief plus coping techniques when she has her cycles. Chronic, not stable.  Start phentermine  37.5 mg daily.  Counseled on diet and exercise.  Follow-up in 1 month and clinic. The patient voiced understanding and agreement to the plan.  Mabel Mt [place], DO 07/01/24  9:01 AM

## 2024-07-23 ENCOUNTER — Encounter: Payer: Self-pay | Admitting: Family Medicine

## 2024-07-26 ENCOUNTER — Encounter: Payer: Self-pay | Admitting: Family Medicine

## 2024-07-27 ENCOUNTER — Encounter: Payer: Self-pay | Admitting: Family Medicine

## 2024-07-27 ENCOUNTER — Telehealth: Admitting: Family Medicine

## 2024-07-27 ENCOUNTER — Telehealth: Payer: Self-pay

## 2024-07-27 DIAGNOSIS — G8929 Other chronic pain: Secondary | ICD-10-CM | POA: Diagnosis not present

## 2024-07-27 DIAGNOSIS — M79672 Pain in left foot: Secondary | ICD-10-CM | POA: Diagnosis not present

## 2024-07-27 DIAGNOSIS — M545 Low back pain, unspecified: Secondary | ICD-10-CM

## 2024-07-27 MED ORDER — OXYCODONE HCL 10 MG PO TABS: 5.0000 mg | ORAL_TABLET | Freq: Four times a day (QID) | ORAL | 0 refills | Status: DC

## 2024-07-27 MED ORDER — PREDNISONE 20 MG PO TABS: 40.0000 mg | ORAL_TABLET | Freq: Every day | ORAL | 0 refills | Status: AC

## 2024-07-27 MED ORDER — OXYCODONE HCL 10 MG PO TABS: 5.0000 mg | ORAL_TABLET | Freq: Four times a day (QID) | ORAL | 0 refills | Status: DC | PRN

## 2024-07-27 NOTE — Addendum Note (Signed)
 Addended by: FRANN MABEL SQUIBB on: 07/27/2024 04:35 PM   Modules accepted: Orders, Level of Service

## 2024-07-27 NOTE — Telephone Encounter (Signed)
 Copied from CRM [id number]. Topic: Clinical - Prescription Issue >> Jul 27, 2024  3:34 PM Rachel [name] wrote: Reason for CRM: patient calling from the pharmacy with an issue requesting for someone to call her  Called spoke with pt about medication Oxycodone   Provider was made aware.

## 2024-07-27 NOTE — Progress Notes (Signed)
 Musculoskeletal Exam  Patient: Rachel [name] DOB: 11/23/1980  DOS: 07/27/2024  SUBJECTIVE:  Chief Complaint:   Chief Complaint  Patient presents with   Foot Injury    Foot Pain    Rachel [name] is a 43 y.o.  female for evaluation and treatment of L foot pain. We are interacting via web portal for an electronic face-to-face visit. I verified patient's ID using 2 identifiers. Patient agreed to proceed with visit via this method. Patient is at home, I am at office. Patient and I are present for visit.   Onset:  10 days ago. Kicked the back of a rocking chair Location: top of L foot pain Character:  throbbing  Progression of issue:  is unchanged Associated symptoms: swelling No bruising or redness Treatment: to date has been rest, OTC NSAIDS, and acetaminophen .   Neurovascular symptoms: no  Past Medical History:  Diagnosis Date   Allergy    Anxiety    Back pain    Depression    Fever blister 02/12/2016   Migraines     Objective: No conversational dyspnea Age appropriate judgment and insight Nml affect and mood  Assessment:  Left foot pain - Plan: DG Foot Complete Left, predniSONE  (DELTASONE ) 20 MG tablet  Chronic low back pain without sciatica, unspecified back pain laterality - Plan: Oxycodone  HCl 10 MG TABS  Plan: Refill oxy early, 5 d pred burst, heat, ice, Tylenol . Flat-soled shoe. XR, if fx will refer to ortho.  F/u as originally scheduled. The patient voiced understanding and agreement to the plan.   Mabel Mt [place], DO 07/27/24  1:30 PM

## 2024-07-27 NOTE — Addendum Note (Signed)
 Addended by: FRANN MABEL SQUIBB on: 07/27/2024 04:44 PM   Modules accepted: Orders

## 2024-07-28 ENCOUNTER — Other Ambulatory Visit: Payer: Self-pay | Admitting: Family Medicine

## 2024-07-28 ENCOUNTER — Encounter: Payer: Self-pay | Admitting: Family Medicine

## 2024-07-28 ENCOUNTER — Telehealth: Payer: Self-pay

## 2024-07-28 DIAGNOSIS — G8929 Other chronic pain: Secondary | ICD-10-CM

## 2024-07-28 MED ORDER — OXYCODONE HCL 10 MG PO TABS: 5.0000 mg | ORAL_TABLET | ORAL | 0 refills | Status: DC | PRN

## 2024-07-28 NOTE — Telephone Encounter (Signed)
 Patient called back, made her aware (thru [employer] rep) that this was signed okay to fill early today at 3:19 pm. She states it has to have today's date on the RX. Please advise.

## 2024-07-28 NOTE — Telephone Encounter (Signed)
 Copied from CRM #[id number]. Topic: General - Other >> Jul 27, 2024  4:50 PM Alfonso HERO wrote: Reason for CRM: patient called back asking for a call back >> Jul 28, 2024  9:07 AM Roselie BROCKS wrote: Patient calling back for update on Mychart  message left last night, stating the pharmacy still wont fill the script , They will not fill a 3 day supply, Has to be a 30 day supply and wording say take 5 a day, and they will fill it and the pharmacy needs a documented reason to fill early. And request a call back concerning this.

## 2024-07-28 NOTE — Telephone Encounter (Signed)
 Copied from CRM [id number]. Topic: Clinical - Prescription Issue >> Jul 28, 2024 11:53 AM Aleatha BROCKS wrote: Reason for CRM:  Patient calling back for update on Mychart  message left last night, as well as today stating the pharmacy still wont fill the script , They will not fill a 3 day supply, Has to be a 30 day supply and wording say take 5 a day, and they will fill it and the pharmacy needs a documented reason to fill early. And request a call back concerning this.  Message has been sent to provider. I have message  Pt as well.

## 2024-07-29 ENCOUNTER — Ambulatory Visit
Admission: RE | Admit: 2024-07-29 | Discharge: 2024-07-29 | Disposition: A | Source: Ambulatory Visit | Attending: Family Medicine

## 2024-07-29 DIAGNOSIS — M79672 Pain in left foot: Secondary | ICD-10-CM | POA: Diagnosis not present

## 2024-07-29 DIAGNOSIS — S99922A Unspecified injury of left foot, initial encounter: Secondary | ICD-10-CM | POA: Diagnosis not present

## 2024-07-31 ENCOUNTER — Ambulatory Visit: Payer: Self-pay | Admitting: Family Medicine

## 2024-08-03 ENCOUNTER — Other Ambulatory Visit: Payer: Self-pay | Admitting: Family Medicine

## 2024-08-03 ENCOUNTER — Ambulatory Visit: Admitting: Family Medicine

## 2024-08-03 ENCOUNTER — Other Ambulatory Visit: Payer: Self-pay

## 2024-08-03 DIAGNOSIS — E669 Obesity, unspecified: Secondary | ICD-10-CM

## 2024-08-03 DIAGNOSIS — M79672 Pain in left foot: Secondary | ICD-10-CM

## 2024-08-04 ENCOUNTER — Encounter: Payer: Self-pay | Admitting: Family Medicine

## 2024-08-04 ENCOUNTER — Ambulatory Visit: Admitting: Family Medicine

## 2024-08-11 ENCOUNTER — Ambulatory Visit: Admitting: Podiatry

## 2024-08-26 ENCOUNTER — Encounter: Payer: Self-pay | Admitting: Family Medicine

## 2024-08-26 ENCOUNTER — Ambulatory Visit: Payer: Self-pay

## 2024-08-26 NOTE — Telephone Encounter (Signed)
 Copied from CRM [id number]. Topic: Clinical - Red Word Triage >> Aug 26, 2024 10:22 AM Mercedes MATSU wrote: Red Word that prompted transfer to Nurse Triage: Pt called in requesting to speak with a nurse. She states her knee is swollen and warm to the touch. Pt wants to know what to do.

## 2024-08-27 NOTE — Telephone Encounter (Signed)
 Copied from CRM [id number]. Topic: General - Call Back - No Documentation >> Aug 26, 2024  5:18 PM Shereese L wrote: Reason for CRM: patient returning office  call and would like a call back

## 2024-08-27 NOTE — Telephone Encounter (Signed)
 Called the patient to see the status of her knee: Patient stated that it gets worse throughout the day. Still swollen, 1 inch more than the left side, warm to touch. Patient stated that she previously had a Bakers cyst in the back of her knee before and stated that it feels the same as before. Tx has been Ibuprofen , ice and rest.  Patient stated that she would go to Urgent care if it got worse and she would let them know that Dr. Frann was her PCP for follow-up notes. She will call later to get an OV next week, if needed.

## 2024-09-10 ENCOUNTER — Encounter: Payer: Self-pay | Admitting: Family Medicine

## 2024-09-11 ENCOUNTER — Emergency Department

## 2024-09-11 ENCOUNTER — Other Ambulatory Visit: Payer: Self-pay

## 2024-09-11 ENCOUNTER — Encounter: Payer: Self-pay | Admitting: Family Medicine

## 2024-09-11 ENCOUNTER — Emergency Department
Admission: EM | Admit: 2024-09-11 | Discharge: 2024-09-11 | Disposition: A | Discharge status: Home / Self Care | Attending: Emergency Medicine | Admitting: Emergency Medicine

## 2024-09-11 ENCOUNTER — Telehealth: Admitting: Family Medicine

## 2024-09-11 ENCOUNTER — Ambulatory Visit: Payer: Self-pay

## 2024-09-11 ENCOUNTER — Telehealth: Payer: Self-pay

## 2024-09-11 DIAGNOSIS — M660 Rupture of popliteal cyst: Secondary | ICD-10-CM

## 2024-09-11 DIAGNOSIS — M7121 Synovial cyst of popliteal space [Baker], right knee: Secondary | ICD-10-CM | POA: Insufficient documentation

## 2024-09-11 DIAGNOSIS — M545 Low back pain, unspecified: Secondary | ICD-10-CM | POA: Diagnosis not present

## 2024-09-11 DIAGNOSIS — G8929 Other chronic pain: Secondary | ICD-10-CM | POA: Diagnosis not present

## 2024-09-11 MED ORDER — OXYCODONE HCL 10 MG PO TABS: 5.0000 mg | ORAL_TABLET | Freq: Four times a day (QID) | ORAL | 0 refills | Status: DC

## 2024-09-11 MED ORDER — IBUPROFEN 600 MG PO TABS
600.0000 mg | ORAL_TABLET | Freq: Once | ORAL | Status: AC
  Administered 2024-09-11: 600 mg via ORAL
  Filled 2024-09-11: qty 1

## 2024-09-11 MED ORDER — OXYCODONE HCL 10 MG PO TABS: 5.0000 mg | ORAL_TABLET | ORAL | 0 refills | Status: DC | PRN

## 2024-09-11 MED ORDER — ACETAMINOPHEN 325 MG PO TABS
650.0000 mg | ORAL_TABLET | Freq: Once | ORAL | Status: AC
  Administered 2024-09-11: 650 mg via ORAL
  Filled 2024-09-11: qty 2

## 2024-09-11 NOTE — ED Provider Notes (Signed)
 [facility] Provider Note    Event Date/Time   First MD Initiated Contact with Patient 09/11/24 [phone]     (approximate)   History   Knee Pain   HPI  Rachel [name] is a 43 y.o. female who presents today for evaluation of right sided leg pain and swelling since August.  Patient reports that her leg feels tight and she has pain with bending her leg completely.  She has not noticed any rash or skin changes.  No fevers or chills.  She is still able to ambulate.  No injuries.  Patient Active Problem List   Diagnosis Date Noted   Obesity (BMI 30-39.9) 07/01/2024   GAD (generalized anxiety disorder) 11/15/2021   Inattention 08/28/2021   Vitamin D  deficiency 11/13/2019   B12 deficiency 11/13/2019   Long term current use of opiate analgesic 07/31/2016   Chronic hip pain (Bilateral) (R>L) 07/31/2016   Chronic shoulder pain (Location of Secondary source of pain) (Left) 07/31/2016   Chronic lower extremity pain (Location of Tertiary source of pain) (Bilateral) (R>L) 07/31/2016   Chronic low back pain without sciatica 12/16/2015   Depression, recurrent 03/06/2015          Physical Exam   Triage Vital Signs: ED Triage Vitals  Encounter Vitals Group     BP 09/11/24 0650 107/76     Girls Systolic BP Percentile --      Girls Diastolic BP Percentile --      Boys Systolic BP Percentile --      Boys Diastolic BP Percentile --      Pulse Rate 09/11/24 0650 76     Resp 09/11/24 0650 19     Temp 09/11/24 0650 98.2 F (36.8 C)     Temp Source 09/11/24 0650 Oral     SpO2 09/11/24 0650 100 %     Weight 09/11/24 0649 195 lb (88.5 kg)     Height 09/11/24 0649 5' 4 (1.626 m)     Head Circumference --      Peak Flow --      Pain Score 09/11/24 0649 8     Pain Loc --      Pain Education --      Exclude from Growth Chart --     Most recent vital signs: Vitals:   09/11/24 0650  BP: 107/76  Pulse: 76  Resp: 19  Temp: 98.2 F (36.8 C)  SpO2: 100%     Physical Exam Vitals and nursing note reviewed.  Constitutional:      General: Awake and alert. No acute distress.    Appearance: Normal appearance.  HENT:     Head: Normocephalic and atraumatic.     Mouth: Mucous membranes are moist.  Eyes:     General: PERRL. Normal EOMs        Right eye: No discharge.        Left eye: No discharge.     Conjunctiva/sclera: Conjunctivae normal.  Cardiovascular:     Rate and Rhythm: Normal rate and regular rhythm.     Pulses: Normal pulses.  Pulmonary:     Effort: Pulmonary effort is normal. No respiratory distress.     Breath sounds: Normal breath sounds.  Abdominal:     Abdomen is soft. There is no abdominal tenderness. No rebound or guarding. No distention. Musculoskeletal:        General: No swelling. Normal range of motion.     Cervical back: Normal range of motion and neck  supple.  Right lower extremity: Mild swelling in comparison to opposite leg, though no warmth or erythema appreciated.  Normal pedal pulses.  Normal active and passive range of motion of knee, as well as ankle and hip.  Sensation is intact to light touch throughout.  Compartments are soft compressible throughout.  No varus or valgus laxity.  Mild pain with McMurray's maneuver. Skin:    General: Skin is warm and dry.     Capillary Refill: Capillary refill takes less than 2 seconds.     Findings: No rash.  Neurological:     Mental Status: The patient is awake and alert.      ED Results / Procedures / Treatments   Labs (all labs ordered are listed, but only abnormal results are displayed) Labs Reviewed - No data to display   EKG     RADIOLOGY I independently reviewed and interpreted imaging and agree with radiologists findings.     PROCEDURES:  Critical Care performed:   Procedures   MEDICATIONS ORDERED IN ED: Medications  ibuprofen  (ADVIL ) tablet 600 mg (600 mg Oral Given 09/11/24 0758)  acetaminophen  (TYLENOL ) tablet 650 mg (650 mg Oral Given  09/11/24 0758)     IMPRESSION / MDM / ASSESSMENT AND PLAN / ED COURSE  I reviewed the triage vital signs and the nursing notes.   Differential diagnosis includes, but is not limited to, meniscus injury, ligamentous strain, Baker's cyst, ruptured Baker's cyst, DVT.  I reviewed the patient's chart.  Patient was seen by a physician on 05/26/2024 at which point she was complaining of right leg pain.  She had an ultrasound which revealed a Baker's cyst without DVT.  She was seen subsequently with opposite leg pain in October.  Patient is awake and alert, hemodynamically stable and afebrile.  There is no ligamental laxity.  She has intact active and passive range of motion.  There is no warmth, erythema, fever, chills to suggest septic joint, and patient is also able to ambulate and range fully.  No pain with axial loading.  She has normal pedal pulses, sensation is intact to light touch and equal to opposite.  Compartments are soft and compressible throughout.  X-ray obtained in triage shows a suprapatellar effusion.  DVT ultrasound reveals a ruptured Baker's cyst, which is likely the cause of her symptoms today.  She was given an Ace wrap and instructed to follow-up with orthopedics.  The appropriate follow-up information was provided.  Also discussed rest, ice, elevation.  We discussed return precautions and outpatient follow-up.  Patient or stands and agrees with plan.  She was discharged in stable condition.  Patient's presentation is most consistent with acute complicated illness / injury requiring diagnostic workup.    FINAL CLINICAL IMPRESSION(S) / ED DIAGNOSES   Final diagnoses:  Ruptured Bakers cyst     Rx / DC Orders   ED Discharge Orders     None        Note:  This document was prepared using Dragon voice recognition software and may include unintentional dictation errors.   Crissa Sowder E, PA-C 09/11/24 [id number]    Dorothyann Drivers, MD 09/11/24 [phone]

## 2024-09-11 NOTE — Telephone Encounter (Signed)
 FYI Only or Action Required?: Action required by provider: medication refill request. PT is requesting that Oxycodone  HCl 10 MG TABS [[id number]]  be called into [facility]. Pt is requesting refill 2 days early and [facility], but [facility] will with provider's ok.  Patient was last seen in primary care on 09/11/2024 by Frann Mabel Mt, DO.  Called Nurse Triage reporting Medication Refill.  Symptoms began na.  Interventions attempted: Other: na.  Symptoms are: na.  Triage Disposition: No disposition on file.  Patient/caregiver understands and will follow disposition?:                    Copied from CRM [id number]. Topic: Clinical - Red Word Triage >> Sep 11, 2024  4:38 PM China J wrote: [facility] that prompted transfer to Nurse Triage: Patient has a swollen knee that is very painful. A medication was prescribed and sent [facility] by Dr. Frann but they would not fill it. She is now needing it switched over to [facility]. Reason for Disposition  [1] Prescription refill request for ESSENTIAL medicine (i.e., likelihood of harm to patient if not taken) AND [2] triager unable to refill per department policy  Answer Assessment - Initial Assessment Questions 1. DRUG NAME: What medicine do you need to have refilled?     Oxycodone  HCl 10 MG TABS [[id number]] 2. REFILLS REMAINING: How many refills are remaining? Notes: The label on the medicine or pill bottle will show how many refills are remaining. If there are no refills remaining, then a renewal may be needed.     none  4. PRESCRIBER: Who prescribed it? Note: The prescribing doctor or group is responsible for refill approvals..     Dr. Frann 5. PHARMACY: Have you contacted your pharmacy (drugstore)? Note: Some pharmacies will contact the doctor (or NP/PA).      Yes - [facility] - [facility] will fill early with request from provider. 6. SYMPTOMS: Do you have any symptoms?     pain  Protocols used:  Medication Refill and Renewal Call-A-AH

## 2024-09-11 NOTE — Discharge Instructions (Signed)
 Your ultrasound reveals a ruptured Baker's cyst.  You may use the Ace wrap to provide compression of this area.  Rest, ice, elevate your knee.  Please follow-up with orthopedics if your symptoms persist.  Please return for any new, worsening, or changing symptoms or other concerns.  It was a pleasure caring for you today.

## 2024-09-11 NOTE — Progress Notes (Signed)
 Musculoskeletal Exam  Patient: Rachel [name] DOB: 1981-02-26  DOS: 09/11/2024  SUBJECTIVE:  Chief Complaint:   Chief Complaint  Patient presents with   Joint Swelling    Follow Up Knee Swelling    Rachel [name] is a 43 y.o.  female for evaluation and treatment of right knee pain. We are interacting via web portal for an electronic face-to-face visit. I verified patient's ID using 2 identifiers. Patient agreed to proceed with visit via this method. Patient is at home, I am at office. Patient and I are present for visit.   Onset:  1 week ago.  She was on the floor playing with her kids. Location: Swelling and pain behind her right knee. Character:  aching and sharp  Progression of issue:  is unchanged Associated symptoms: Swelling, decreased range of motion No bruising, catching/locking, or redness. Treatment: to date has been ice and narcotics.   Neurovascular symptoms: no  Past Medical History:  Diagnosis Date   Allergy    Anxiety    Back pain    Depression    Fever blister 02/12/2016   Migraines     Objective: No conversational dyspnea Age appropriate judgment and insight Nml affect and mood  Assessment:  Ruptured Bakers cyst  Chronic low back pain without sciatica, unspecified back pain laterality - Plan: Oxycodone  HCl 10 MG TABS, Oxycodone  HCl 10 MG TABS, Oxycodone  HCl 10 MG TABS  Plan: She has an appointment with the Ortho team.  Ultrasound at the ER showed a ruptured Baker's cyst.  Possibly a tear.  Will refill oxy with option for an extra tab per day.  Ice, Tylenol , heat, keep it moving as best possible. F/u as originally scheduled. The patient voiced understanding and agreement to the plan.   Mabel Mt [place], DO 09/11/24  2:58 PM

## 2024-09-11 NOTE — Telephone Encounter (Signed)
 Copied from CRM #[id number]. Topic: Clinical - Prescription Issue >> Sep 11, 2024  4:01 PM Rachel [name] wrote: Reason for CRM: Patient called in regarding precription Oxycodone  HCl 10 MG TABS , Patient stated that [facility] will not fill it  want to know if they can get it refilled at another pharmacy ,patient stated she would like it before the end of the day   [phone]

## 2024-09-11 NOTE — Telephone Encounter (Signed)
 Called spoke with Pharmacy and Oxycodone  HCl 10  Is ready for pick up. Called pt and was advised.

## 2024-09-11 NOTE — ED Triage Notes (Signed)
 Pt c/o right knee pain, pt reports she has had problems with same knee for past 6 mos. Hx of bakers cyst to knee. Pt reports recently she felt a pop in her knee and since has had inc pain and swelling.

## 2024-09-14 ENCOUNTER — Ambulatory Visit: Admitting: Family Medicine

## 2024-09-28 ENCOUNTER — Telehealth: Admitting: Family Medicine

## 2024-09-28 ENCOUNTER — Ambulatory Visit: Admitting: Family Medicine

## 2024-09-28 ENCOUNTER — Encounter: Payer: Self-pay | Admitting: Family Medicine

## 2024-09-28 DIAGNOSIS — M660 Rupture of popliteal cyst: Secondary | ICD-10-CM

## 2024-09-28 DIAGNOSIS — M545 Low back pain, unspecified: Secondary | ICD-10-CM

## 2024-09-28 DIAGNOSIS — Z20828 Contact with and (suspected) exposure to other viral communicable diseases: Secondary | ICD-10-CM | POA: Diagnosis not present

## 2024-09-28 DIAGNOSIS — G8929 Other chronic pain: Secondary | ICD-10-CM | POA: Diagnosis not present

## 2024-09-28 MED ORDER — OXYCODONE HCL 10 MG PO TABS: 5.0000 mg | ORAL_TABLET | Freq: Four times a day (QID) | ORAL | 0 refills | Status: DC

## 2024-09-28 MED ORDER — OXYCODONE HCL 10 MG PO TABS: 5.0000 mg | ORAL_TABLET | ORAL | 0 refills | Status: DC | PRN

## 2024-09-28 MED ORDER — OSELTAMIVIR PHOSPHATE 75 MG PO CAPS: 75.0000 mg | ORAL_CAPSULE | Freq: Two times a day (BID) | ORAL | 0 refills | Status: DC

## 2024-09-28 MED ORDER — OSELTAMIVIR PHOSPHATE 75 MG PO CAPS: 75.0000 mg | ORAL_CAPSULE | Freq: Two times a day (BID) | ORAL | 0 refills | Status: AC

## 2024-09-28 NOTE — Progress Notes (Signed)
 Chief Complaint  Patient presents with   Follow-up    Follow Up Knee Pain    Subjective: Patient is a 43 y.o. female here for f/u. We are interacting via web portal for an electronic face-to-face visit. I verified patient's ID using 2 identifiers. Patient agreed to proceed with visit via this method. Patient is at home, I am at office. Patient and I are present for visit.   Patient has a son who was diagnosed with flu today.  She is not having any symptoms yet.  She is wondering if she needs an antiviral.  The patient has been dealing with a Baker's cyst.  Her appointment was pushed back from 30th into January.  She is still having pain.  There is no redness, excessive warmth, or fevers.  Walking is difficult secondary to pain.   Past Medical History:  Diagnosis Date   Allergy    Anxiety    Back pain    Depression    Fever blister 02/12/2016   Migraines     Objective: No conversational dyspnea Age appropriate judgment and insight Nml affect and mood  Assessment and Plan: Exposure to the flu  Ruptured Bakers cyst  Chronic low back pain without sciatica, unspecified back pain laterality - Plan: Oxycodone  HCl 10 MG TABS, Oxycodone  HCl 10 MG TABS  5 days of Tamiflu  75 mg twice daily.  Supportive care otherwise. We discussed warning signs and symptoms suggestive of an infection and when to seek immediate care.  Heat, ice, Tylenol , stretches, continue pain management. The patient voiced understanding and agreement to the plan.  Mabel Mt [place], DO 09/28/2024  1:43 PM

## 2024-10-06 ENCOUNTER — Ambulatory Visit: Admitting: Orthopedic Surgery

## 2024-10-15 ENCOUNTER — Ambulatory Visit: Admitting: Orthopedic Surgery

## 2024-10-15 DIAGNOSIS — M25561 Pain in right knee: Secondary | ICD-10-CM | POA: Diagnosis not present

## 2024-10-15 DIAGNOSIS — G8929 Other chronic pain: Secondary | ICD-10-CM

## 2024-10-15 DIAGNOSIS — M25361 Other instability, right knee: Secondary | ICD-10-CM

## 2024-10-15 NOTE — Progress Notes (Unsigned)
 New Patient Visit  Summary: Rachel [name] is a 44 y.o. female with the following: There are no diagnoses linked to this encounter.  Assessment and Plan Assessment & Plan Right knee meniscal injury and Baker's cyst Chronic right knee pain, swelling, and mechanical symptoms suggest meniscal injury and multiple Baker's cysts, including a ruptured one, contributing to symptoms. Conservative management has failed. - Ordered right knee MRI to evaluate meniscal injury and assess Baker's cysts. - Explained MRI findings will guide management; possible drainage and corticosteroid injection if only Baker's cyst is present. - Discussed surgical intervention depends on MRI findings and is not guaranteed. - Instructed her to notify clinic if symptoms worsen before follow-up. - Arranged follow-up visit after MRI and radiologist interpretation.     Follow-up: Return for After MRI.  Subjective:  No chief complaint on file.    Discussed the use of AI scribe software for clinical note transcription with the patient, who gave verbal consent to proceed.  History of Present Illness Rachel [name] is a 44 year old female with chronic right knee pain who presents for evaluation of meniscal injury and Baker's cyst.  Right knee pain started in August 2025 after a loud pop while lifting an object during yard work, without twisting. Pain was severe with swelling over 24 hours, initially posterior.  She now has persistent sharp pain involving the anterior, posterior, and lateral right knee, with tightness and pinching posteriorly. She cannot fully flex or extend the knee and notes audible crepitus and popping with movement. She has intermittent buckling, a feeling of instability, daily swelling that worsens as the day goes on, and greater swelling than the left knee, which significantly limits daily activities.  Ultrasound showed multiple Baker's cysts. One ruptured, causing acute pain and a  loud pop that led to an emergency department visit. She takes Motrin  in the mornings with only transient improvement and has tried bracing and home exercises from her primary care physician and a sports medicine provider but has not done formal physical therapy.    Review of Systems: No fevers or chills*** No numbness or tingling No chest pain No shortness of breath No bowel or bladder dysfunction No GI distress No headaches   Medical History:  Past Medical History:  Diagnosis Date   Allergy    Anxiety    Back pain    Depression    Fever blister 02/12/2016   Migraines     Past Surgical History:  Procedure Laterality Date   CESAREAN SECTION N/A 08/26/2015   Procedure: CESAREAN SECTION;  Surgeon: Archie Savers, MD;  Location: [facility] ORS;  Service: Obstetrics;  Laterality: N/A;   CESAREAN SECTION WITH BILATERAL TUBAL LIGATION  06/06/2018   Procedure: CESAREAN SECTION WITH BILATERAL TUBAL LIGATION;  Surgeon: Neomi Mitzie BROCKS, MD;  Location: [facility] ORS;  Service: Obstetrics;;   GANGLION CYST EXCISION Right wrist   GANGLION CYST EXCISION     right hand 07/2014    Family History  Problem Relation Age of Onset   Heart disease Mother    Heart disease Father    Social History[1]  Allergies[2]  Active Medications[3]  Objective: There were no vitals taken for this visit.  Physical Exam:    General: {General PE Findings:25791} Gait: {Gait:25792}  Physical Exam MUSCULOSKELETAL: Right knee swollen compared to left. Painful on flexion beyond 100 degrees.   IMAGING: {XR Reviewed:24899}     New Medications:  No orders of the defined types were placed in this encounter.  Portions of this note were completed via Scientist, clinical (histocompatibility and immunogenetics).  Oneil DELENA Horde, MD  10/15/2024 10:24 AM      [1]  Social History Tobacco Use   Smoking status: Never   Smokeless tobacco: Never  Substance Use Topics   Alcohol use: No   Drug use: No  [2]  Allergies Allergen  Reactions   Penicillins Anaphylaxis   Sulfa Antibiotics Rash  [3]  No outpatient medications have been marked as taking for the 10/15/24 encounter (Office Visit) with Horde Oneil DELENA, MD.

## 2024-10-18 ENCOUNTER — Encounter: Payer: Self-pay | Admitting: Orthopedic Surgery

## 2024-10-19 ENCOUNTER — Encounter: Payer: Self-pay | Admitting: Orthopedic Surgery

## 2024-10-21 ENCOUNTER — Inpatient Hospital Stay: Admission: RE | Admit: 2024-10-21 | Discharge: 2024-10-21 | Attending: Orthopedic Surgery

## 2024-10-21 DIAGNOSIS — M25361 Other instability, right knee: Secondary | ICD-10-CM

## 2024-10-27 ENCOUNTER — Telehealth (INDEPENDENT_AMBULATORY_CARE_PROVIDER_SITE_OTHER): Admitting: Family Medicine

## 2024-10-27 ENCOUNTER — Encounter: Payer: Self-pay | Admitting: Family Medicine

## 2024-10-27 DIAGNOSIS — M545 Low back pain, unspecified: Secondary | ICD-10-CM

## 2024-10-27 DIAGNOSIS — S83281A Other tear of lateral meniscus, current injury, right knee, initial encounter: Secondary | ICD-10-CM | POA: Diagnosis not present

## 2024-10-27 DIAGNOSIS — G8929 Other chronic pain: Secondary | ICD-10-CM | POA: Diagnosis not present

## 2024-10-27 MED ORDER — OXYCODONE HCL 10 MG PO TABS: 5.0000 mg | ORAL_TABLET | ORAL | 0 refills | Status: AC | PRN

## 2024-10-27 NOTE — Progress Notes (Signed)
 Musculoskeletal Exam  Patient: Rachel [name] DOB: 1981/05/07  DOS: 10/27/2024  SUBJECTIVE:  Chief Complaint:   Chief Complaint  Patient presents with   Knee Pain    Follow Up Knee    Rachel [name] is a 44 y.o.  female for f/u of knee pain. We are interacting via web portal for an electronic face-to-face visit. I verified patient's ID using 2 identifiers. Patient agreed to proceed with visit via this method. Patient is at home, I am at office. Patient and I are present for visit.   Pt w known ruptured baker's cyst. Had MRI ordered by ortho. Showed a lateral meniscal tear also. She has a hx of chronic low back pain. She takes oxycodone  5-10 mg qid prn at a baseline but with the knee pain, has been taking it 5x/d. No AE's.   Past Medical History:  Diagnosis Date   Allergy    Anxiety    Back pain    Depression    Fever blister 02/12/2016   Migraines     Objective: No conversational dyspnea Age appropriate judgment and insight Nml affect and mood  Assessment:  Acute lateral meniscus tear of right knee, initial encounter  Chronic low back pain without sciatica, unspecified back pain laterality  Plan: Pain chronic, uncontrolled. Increase dosing of oxy 5-10 mg form qid to 5 times daily prn. Stretches/exercises, heat, ice, Tylenol .  F/u as originally scheduled. The patient voiced understanding and agreement to the plan.   Mabel Mt Olmitz, DO 10/27/24  12:48 PM

## 2024-10-29 ENCOUNTER — Ambulatory Visit: Admitting: Orthopedic Surgery

## 2024-10-29 ENCOUNTER — Encounter: Payer: Self-pay | Admitting: Orthopedic Surgery

## 2024-10-29 DIAGNOSIS — M7121 Synovial cyst of popliteal space [Baker], right knee: Secondary | ICD-10-CM

## 2024-10-29 DIAGNOSIS — M25361 Other instability, right knee: Secondary | ICD-10-CM

## 2024-10-29 NOTE — Patient Instructions (Signed)

## 2024-10-29 NOTE — Progress Notes (Signed)
 New Patient Visit  Summary: Rachel [name] is a 44 y.o. female with the following: There are no diagnoses linked to this encounter.  Assessment and Plan Assessment & Plan Right knee meniscal injury and Baker's cyst Chronic right knee pain with lateral anterior meniscal tear and Baker's cyst. Surgical intervention deferred due to lack of mechanical symptoms. Consider knee arthroscopy if pain persists or worsens. - Drained Baker's cyst and administered corticosteroid injection. - Advised offloading knee with brace and walker. - Scheduled follow-up in six weeks. - Discussed future surgical intervention if conservative measures fail.  Bone bruise of right knee Significant bone bruise with marrow edema. Self-limited injury requiring time for resolution. - Advised offloading knee with brace and walker. - Provided guidance on natural resolution of bone bruises.   Procedure note injection - Right knee (Bakers cyst), ultrasound guidance   Verbal consent was obtained to aspirate the Right knee, Bakers cyst Timeout was completed to confirm the site of aspiration and injection.   Using the ultrasound, the cyst was identified.   The skin was prepped with alcohol and ethyl chloride was sprayed at the injection site.  Under direct visualization, the needle was introduced within the cyst We then aspirated 8 cc of clear joint fluid Using the same needle, 40 mg of Depo-Medrol and 1% lidocaine  (1 cc) was injected into the remaining cyst cavity of the posterior Right knee using a posterior approach.   There were no complications.  A sterile bandage was applied.   Note: In order to accurately identify the placement of the needle, ultrasound was required, to increase the accuracy, and specificity of the injection.   Follow-up: Return in about 6 weeks (around 12/10/2024).  Subjective:  Chief Complaint  Patient presents with   MRI follow-up    Right knee     Discussed the use of AI scribe  software for clinical note transcription with the patient, who gave verbal consent to proceed.  History of Present Illness Rachel [name] is a 44 year old female with chronic right knee pain who presents for evaluation of persistent right knee pain.  Right knee pain is sharp and stinging, worst laterally and posteriorly, with intermittent sharp pain anteriorly. Pain is aggravated by knee flexion, and she cannot fully flex the knee due to discomfort. She notes swelling and occasional catching, without frequent buckling or locking. Symptoms have not improved since onset.  She had a remote right knee skiing injury about twenty years ago with full recovery. Current MRI shows an anterior meniscal tear, lateral-central bone bruise with marrow edema, and a slowly leaking Baker's cyst posteriorly. Her knee brace used since August 2025 now fits poorly due to swelling and causes discomfort by pressing on the knee. She continues to need additional pain medication for control of knee pain.    Review of Systems: No fevers or chills No numbness or tingling No chest pain No shortness of breath No bowel or bladder dysfunction No GI distress No headaches    Objective: There were no vitals taken for this visit.  Physical Exam:    General: Alert and oriented. and No acute distress. Gait: Right sided antalgic gait.  Physical Exam MUSCULOSKELETAL: Right knee with mild swelling.  No redness.  Tenderness palpation along the lateral joint line.  Pain in the back of the knee with flexion beyond 100 degrees.  There is fullness and tenderness within the popliteal fossa.  No increased laxity varus or valgus stress.   IMAGING: I personally ordered and reviewed  the following images  Right knee MRI  IMPRESSION: Mild tricompartmental osteoarthrosis without full-thickness cartilage loss. There is a small cartilage flap in the lateral facet of patella. Mild-to-moderate joint effusion is present.    Horizontal tear of the anterior horn and body of the lateral meniscus. No displaced meniscal flap.   Moderate slightly leaking Baker's cyst.   There is likely a large pes anserine bursal collection which is difficult to separate from the adjacent Baker's cyst. Clinical correlation.    New Medications:  No orders of the defined types were placed in this encounter.     Portions of this note were completed via Scientist, clinical (histocompatibility and immunogenetics).  Oneil DELENA Horde, MD  10/29/2024 9:53 AM

## 2024-12-17 ENCOUNTER — Ambulatory Visit: Admitting: Orthopedic Surgery
